# Patient Record
Sex: Female | Born: 1960 | Race: White | Hispanic: No | Smoking: Former smoker
Health system: Southern US, Community
[De-identification: ages and names within clinical notes are randomized; demographics above are authoritative.]

## PROBLEM LIST (undated history)

## (undated) DIAGNOSIS — M961 Postlaminectomy syndrome, not elsewhere classified: Secondary | ICD-10-CM

## (undated) DIAGNOSIS — M810 Age-related osteoporosis without current pathological fracture: Secondary | ICD-10-CM

## (undated) DIAGNOSIS — Z860100 Personal history of colon polyps, unspecified: Secondary | ICD-10-CM

## (undated) DIAGNOSIS — D649 Anemia, unspecified: Secondary | ICD-10-CM

## (undated) DIAGNOSIS — G43909 Migraine, unspecified, not intractable, without status migrainosus: Secondary | ICD-10-CM

## (undated) DIAGNOSIS — B019 Varicella without complication: Secondary | ICD-10-CM

## (undated) DIAGNOSIS — G4761 Periodic limb movement disorder: Secondary | ICD-10-CM

## (undated) DIAGNOSIS — K219 Gastro-esophageal reflux disease without esophagitis: Secondary | ICD-10-CM

## (undated) DIAGNOSIS — F17200 Nicotine dependence, unspecified, uncomplicated: Secondary | ICD-10-CM

## (undated) DIAGNOSIS — E559 Vitamin D deficiency, unspecified: Secondary | ICD-10-CM

## (undated) DIAGNOSIS — T7840XA Allergy, unspecified, initial encounter: Secondary | ICD-10-CM

## (undated) DIAGNOSIS — M1990 Unspecified osteoarthritis, unspecified site: Secondary | ICD-10-CM

## (undated) DIAGNOSIS — J309 Allergic rhinitis, unspecified: Secondary | ICD-10-CM

## (undated) DIAGNOSIS — R7309 Other abnormal glucose: Secondary | ICD-10-CM

## (undated) DIAGNOSIS — K2270 Barrett's esophagus without dysplasia: Secondary | ICD-10-CM

## (undated) DIAGNOSIS — R9431 Abnormal electrocardiogram [ECG] [EKG]: Secondary | ICD-10-CM

## (undated) DIAGNOSIS — M797 Fibromyalgia: Secondary | ICD-10-CM

## (undated) DIAGNOSIS — G4700 Insomnia, unspecified: Secondary | ICD-10-CM

## (undated) DIAGNOSIS — F329 Major depressive disorder, single episode, unspecified: Secondary | ICD-10-CM

## (undated) DIAGNOSIS — M5412 Radiculopathy, cervical region: Secondary | ICD-10-CM

## (undated) DIAGNOSIS — I10 Essential (primary) hypertension: Secondary | ICD-10-CM

## (undated) DIAGNOSIS — K449 Diaphragmatic hernia without obstruction or gangrene: Secondary | ICD-10-CM

## (undated) DIAGNOSIS — F32A Depression, unspecified: Secondary | ICD-10-CM

## (undated) HISTORY — DX: Fibromyalgia: M79.7

## (undated) HISTORY — DX: Age-related osteoporosis without current pathological fracture: M81.0

## (undated) HISTORY — DX: Anemia, unspecified: D64.9

## (undated) HISTORY — DX: Migraine, unspecified, not intractable, without status migrainosus: G43.909

## (undated) HISTORY — DX: Personal history of colon polyps, unspecified: Z86.0100

## (undated) HISTORY — DX: Nicotine dependence, unspecified, uncomplicated: F17.200

## (undated) HISTORY — DX: Allergy, unspecified, initial encounter: T78.40XA

## (undated) HISTORY — DX: Abnormal electrocardiogram (ECG) (EKG): R94.31

## (undated) HISTORY — DX: Gastro-esophageal reflux disease without esophagitis: K21.9

## (undated) HISTORY — DX: Allergic rhinitis, unspecified: J30.9

## (undated) HISTORY — DX: Diaphragmatic hernia without obstruction or gangrene: K44.9

## (undated) HISTORY — PX: DILATION AND CURETTAGE OF UTERUS: SHX78

## (undated) HISTORY — DX: Insomnia, unspecified: G47.00

## (undated) HISTORY — PX: BREAST SURGERY: SHX581

## (undated) HISTORY — PX: CHOLECYSTECTOMY: SHX55

## (undated) HISTORY — DX: Periodic limb movement disorder: G47.61

## (undated) HISTORY — DX: Barrett's esophagus without dysplasia: K22.70

## (undated) HISTORY — PX: DIAGNOSTIC LAPAROSCOPY: SUR761

## (undated) HISTORY — DX: Vitamin D deficiency, unspecified: E55.9

## (undated) HISTORY — DX: Major depressive disorder, single episode, unspecified: F32.9

## (undated) HISTORY — PX: TUBAL LIGATION: SHX77

## (undated) HISTORY — DX: Essential (primary) hypertension: I10

## (undated) HISTORY — PX: BACK SURGERY: SHX140

## (undated) HISTORY — DX: Other abnormal glucose: R73.09

## (undated) HISTORY — DX: Unspecified osteoarthritis, unspecified site: M19.90

## (undated) HISTORY — DX: Personal history of colonic polyps: Z86.010

## (undated) HISTORY — PX: ABDOMINAL HYSTERECTOMY: SHX81

## (undated) HISTORY — PX: OTHER SURGICAL HISTORY: SHX169

## (undated) HISTORY — DX: Varicella without complication: B01.9

## (undated) HISTORY — PX: KNEE SURGERY: SHX244

## (undated) HISTORY — PX: PARATHYROIDECTOMY: SHX19

## (undated) HISTORY — PX: THYROIDECTOMY: SHX17

## (undated) HISTORY — DX: Depression, unspecified: F32.A

---

## 1990-07-17 HISTORY — PX: OTHER SURGICAL HISTORY: SHX169

## 1995-07-18 HISTORY — PX: OTHER SURGICAL HISTORY: SHX169

## 2000-07-17 HISTORY — PX: OTHER SURGICAL HISTORY: SHX169

## 2001-05-18 ENCOUNTER — Emergency Department (HOSPITAL_COMMUNITY): Admission: EM | Admit: 2001-05-18 | Discharge: 2001-05-18 | Payer: Self-pay | Admitting: Emergency Medicine

## 2001-05-18 ENCOUNTER — Encounter: Payer: Self-pay | Admitting: Emergency Medicine

## 2001-09-14 ENCOUNTER — Emergency Department (HOSPITAL_COMMUNITY): Admission: EM | Admit: 2001-09-14 | Discharge: 2001-09-14 | Payer: Self-pay | Admitting: Emergency Medicine

## 2001-10-08 ENCOUNTER — Ambulatory Visit (HOSPITAL_COMMUNITY): Admission: RE | Admit: 2001-10-08 | Discharge: 2001-10-08 | Payer: Self-pay | Admitting: Orthopaedic Surgery

## 2001-10-08 ENCOUNTER — Encounter: Payer: Self-pay | Admitting: Orthopaedic Surgery

## 2003-07-18 HISTORY — PX: BREAST EXCISIONAL BIOPSY: SUR124

## 2004-03-31 ENCOUNTER — Ambulatory Visit (HOSPITAL_BASED_OUTPATIENT_CLINIC_OR_DEPARTMENT_OTHER): Admission: RE | Admit: 2004-03-31 | Discharge: 2004-03-31 | Payer: Self-pay | Admitting: Surgery

## 2004-03-31 ENCOUNTER — Ambulatory Visit (HOSPITAL_COMMUNITY): Admission: RE | Admit: 2004-03-31 | Discharge: 2004-03-31 | Payer: Self-pay | Admitting: Surgery

## 2004-03-31 ENCOUNTER — Encounter (INDEPENDENT_AMBULATORY_CARE_PROVIDER_SITE_OTHER): Payer: Self-pay | Admitting: *Deleted

## 2004-10-06 ENCOUNTER — Ambulatory Visit: Payer: Self-pay | Admitting: Family Medicine

## 2005-06-23 ENCOUNTER — Ambulatory Visit: Payer: Self-pay

## 2005-07-17 HISTORY — PX: OTHER SURGICAL HISTORY: SHX169

## 2005-12-08 ENCOUNTER — Ambulatory Visit: Payer: Self-pay | Admitting: Family Medicine

## 2005-12-19 ENCOUNTER — Ambulatory Visit: Payer: Self-pay | Admitting: Family Medicine

## 2006-02-10 ENCOUNTER — Emergency Department (HOSPITAL_COMMUNITY): Admission: EM | Admit: 2006-02-10 | Discharge: 2006-02-11 | Payer: Self-pay | Admitting: Emergency Medicine

## 2007-01-01 ENCOUNTER — Ambulatory Visit: Payer: Self-pay | Admitting: Gastroenterology

## 2007-07-18 HISTORY — PX: VAGINAL HYSTERECTOMY: SHX2639

## 2007-07-18 HISTORY — PX: OTHER SURGICAL HISTORY: SHX169

## 2007-07-22 ENCOUNTER — Encounter (INDEPENDENT_AMBULATORY_CARE_PROVIDER_SITE_OTHER): Payer: Self-pay | Admitting: Obstetrics and Gynecology

## 2007-07-22 ENCOUNTER — Ambulatory Visit (HOSPITAL_COMMUNITY): Admission: RE | Admit: 2007-07-22 | Discharge: 2007-07-23 | Payer: Self-pay | Admitting: Obstetrics and Gynecology

## 2008-01-08 ENCOUNTER — Ambulatory Visit: Payer: Self-pay | Admitting: Internal Medicine

## 2008-01-10 ENCOUNTER — Ambulatory Visit: Payer: Self-pay | Admitting: Internal Medicine

## 2008-01-24 ENCOUNTER — Emergency Department: Payer: Self-pay | Admitting: Emergency Medicine

## 2008-02-04 ENCOUNTER — Ambulatory Visit: Payer: Self-pay | Admitting: Gastroenterology

## 2008-02-07 ENCOUNTER — Ambulatory Visit: Payer: Self-pay | Admitting: Gastroenterology

## 2008-02-25 ENCOUNTER — Ambulatory Visit: Payer: Self-pay | Admitting: Obstetrics and Gynecology

## 2008-02-25 ENCOUNTER — Other Ambulatory Visit: Payer: Self-pay

## 2008-03-06 ENCOUNTER — Ambulatory Visit: Payer: Self-pay | Admitting: Obstetrics and Gynecology

## 2008-05-15 ENCOUNTER — Ambulatory Visit: Payer: Self-pay | Admitting: Internal Medicine

## 2008-07-17 HISTORY — PX: SHOULDER ARTHROSCOPY: SHX128

## 2008-11-12 ENCOUNTER — Ambulatory Visit: Payer: Self-pay | Admitting: Internal Medicine

## 2008-11-17 ENCOUNTER — Ambulatory Visit: Payer: Self-pay | Admitting: Gastroenterology

## 2009-01-19 ENCOUNTER — Ambulatory Visit: Payer: Self-pay | Admitting: Gastroenterology

## 2009-02-14 LAB — HM COLONOSCOPY

## 2009-04-08 ENCOUNTER — Encounter: Payer: Self-pay | Admitting: Family Medicine

## 2009-04-16 ENCOUNTER — Encounter: Payer: Self-pay | Admitting: Family Medicine

## 2009-04-28 ENCOUNTER — Ambulatory Visit: Payer: Self-pay | Admitting: Family Medicine

## 2009-05-05 ENCOUNTER — Ambulatory Visit: Payer: Self-pay | Admitting: Family Medicine

## 2009-05-17 ENCOUNTER — Encounter: Payer: Self-pay | Admitting: Family Medicine

## 2009-06-24 HISTORY — PX: OTHER SURGICAL HISTORY: SHX169

## 2009-07-15 LAB — HM PAP SMEAR

## 2009-10-26 ENCOUNTER — Ambulatory Visit: Payer: Self-pay | Admitting: Family Medicine

## 2009-11-03 ENCOUNTER — Ambulatory Visit: Payer: Self-pay | Admitting: Family Medicine

## 2010-08-01 ENCOUNTER — Encounter: Payer: Self-pay | Admitting: Cardiology

## 2010-09-19 ENCOUNTER — Ambulatory Visit: Payer: Self-pay | Admitting: Family Medicine

## 2010-09-19 ENCOUNTER — Encounter: Payer: Self-pay | Admitting: Cardiology

## 2010-09-22 ENCOUNTER — Encounter: Payer: Self-pay | Admitting: Cardiology

## 2010-09-22 ENCOUNTER — Other Ambulatory Visit (INDEPENDENT_AMBULATORY_CARE_PROVIDER_SITE_OTHER): Payer: 59

## 2010-09-22 ENCOUNTER — Other Ambulatory Visit: Payer: Self-pay | Admitting: Cardiology

## 2010-09-22 ENCOUNTER — Ambulatory Visit (INDEPENDENT_AMBULATORY_CARE_PROVIDER_SITE_OTHER): Payer: 59 | Admitting: Cardiology

## 2010-09-22 DIAGNOSIS — R9431 Abnormal electrocardiogram [ECG] [EKG]: Secondary | ICD-10-CM | POA: Insufficient documentation

## 2010-09-27 NOTE — Assessment & Plan Note (Signed)
Summary: EKG changes/Dr.Kristi Smith/sab   Visit Type:  Initial Consult Primary Provider:  Reginia Forts, M.D.  CC:  Cardiac clearance for neck surgery; scheduled for March 13 and 2012 at [facility] with Dr. Myrtis Ser.  Was told by PCP EKG looks abnormal. She also c/o rapid heart beats at times..  History of Present Illness: 50 yo with history of HTN, GERD, depression, and neck arthritis presents for cardiology evaluation prior to c-spine surgery under general anesthesia later this month at [facility].  Patient was seen by her PCP recently for pre-surgical visit and was found to have some changes on her ECG: there was slight scooped ST depression in leads V3-V6.  She was referred here for further evaluation.  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  She does not get formal exercise but denies any significant exertional dyspnea walking on flat ground.  She is mildly winded after climbing a long flight of steps.  She is somewhat limited by pain in her left knee.  She has never had chest pain or pressure.  She quit smoking in 2011.  She reports that her cholesterol has been good when checked by Dr. Tamala Julian.  BP is under control in the office today.   ECG: NSR, slight (< 0.5 mm) scooped ST depression in leads V3-V6  Labs (1/12): HCT 39.5, K 3.9, creatinine 0.67  Current Medications (verified): 1)  Singulair 10 Mg Tabs (Montelukast Sodium) .Marland Kitchen.. 1 Tablet Once Daily For Allergies 2)  Tizanidine Hcl 4 Mg Tabs (Tizanidine Hcl) .Marland Kitchen.. 1 Tablet At Bedtime, As Needed 3)  Hyoscyamine Sulfate 0.125 Mg Tabs (Hyoscyamine Sulfate) .Marland Kitchen.. 1-2 Tablet Every 4-6 Hours As Needed Abdominal Pain 4)  Amlodipine Besylate 5 Mg Tabs (Amlodipine Besylate) .Marland Kitchen.. 1 Tablet Once Daily 5)  Hydrochlorothiazide 12.5 Mg Tabs (Hydrochlorothiazide) .... Take One Tablet By Mouth Daily. 6)  Propranolol Hcl 60 Mg Tabs (Propranolol Hcl)  .Marland Kitchen.. 1 Tablet Two Times A Day 7)  Hydrocodone-Acetaminophen 5-325 Mg Tabs (Hydrocodone-Acetaminophen) .Marland Kitchen.. 1-2 Tablets Every 6 Hours As Needed 8)  Zyrtec Allergy 10 Mg Tabs (Cetirizine Hcl) .Marland Kitchen.. 1 Tablet Once Daily 9)  Dexilant 60 Mg Cpdr (Dexlansoprazole) .Marland Kitchen.. 1 Tablet Once Daily 10)  Dilaudid 4 Mg Tabs (Hydromorphone Hcl) .Marland Kitchen.. 1 Tablet Every 4-6 Hours As Needed  Allergies (verified): 1)  ! Wellbutrin 2)  ! * Ritalin 3)  ! Celebrex 4)  ! * Provigil 5)  ! * Aciphex  Past History:  Past Surgical History: Last updated: 09/21/2010 C-section x2 Pilonidal cyst-resection-1992 Cholecystectomy Carpal tunnel syndrome-release-bilateral  D&C-SAB Tubel Ligation-2002 Breast bx-right-2005 Fusion of cervical spine-2007 Hx laparoscopy-right oophorectomy performed Vaginal hyserectomy-2009 Right shoulder surgery-06/24/2009  Family History: Last updated: 09/22/2010 Father: "angina" in his late 59s, PCI in his late 60s Mother: bipolar disorder,renal artery stenosis, bladder cancer, HTN OA,Diabetes Grandfather: CVA  Social History: Last updated: 09/22/2010 Tobacco Use - 30-35 years 1 ppd-quit 2011 Alcohol Use - yes Regular Exercise - no Drug Use - no Works at [employer], has a Network engineer job Married, lives in [place] Factors: Exercise: no (09/21/2010)  Risk Factors: Smoking Status: quit (09/21/2010)  Past Medical History: 1. 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 GERD 4. Hypertension 5. Cholcystectomy 6. 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   Family History: Father: "angina" in his late 56s, PCI in his late 59s Mother: bipolar disorder,renal artery stenosis,  bladder cancer, HTN OA,Diabetes Grandfather: CVA  Social History: Tobacco Use - 30-35 years 1 ppd-quit 2011 Alcohol Use - yes Regular Exercise - no Drug Use - no Works at [employer], has a Network engineer job Married, lives in [place] reviewed and negative except as per HPI.   Vital Signs:  Patient profile:   50 year old female Height:      62 inches Weight:      170 pounds BMI:     31.21 Pulse rate:   56 / minute BP sitting:   130 / 84  (left arm) Cuff size:   regular  Vitals Entered By: Dolores Lory, CMA (September 22, 2010 11:03 AM)  Physical Exam  General:  Well developed, well nourished, in no acute distress. Overweight.  Head:  normocephalic and atraumatic Nose:  no deformity, discharge, inflammation, or lesions Mouth:  Teeth, gums and palate normal. Oral mucosa normal. Neck:  Neck supple, no JVD. No masses, thyromegaly or abnormal cervical nodes. Lungs:  Clear bilaterally to auscultation and percussion. Heart:  Non-displaced PMI, chest non-tender; regular rate and rhythm, S1, S2 without murmurs, rubs or gallops. Carotid upstroke normal, no bruit.  Pedals normal pulses. No edema, no varicosities. Abdomen:  Bowel sounds positive; abdomen soft and non-tender without masses, organomegaly, or hernias noted. No hepatosplenomegaly. Extremities:  No clubbing or cyanosis. Neurologic:  Alert and oriented x 3. Skin:  Intact without lesions or rashes. Psych:  Normal affect.   Impression & Recommendations:  Problem # 1:  ELECTROCARDIOGRAM, ABNORMAL (ICD-794.31) Patient has a nonspecifically abnormal ECG.  She has no significant exertional symptoms and certainly is able to exert herself to over 4 METS with no cardiopulmonary limitation.  I will get an echocardiogram given the mildly abnormal ECG, but I do not think that a pre-operative stress test would be indicated.  She should be relatively low risk for general anesthesia.  We will get the echo today and as long as there are no significant problems on this, she needs no further workup prior to surgery.  Would continue her antihypertensives peri-operatively.   Other Orders: Echocardiogram (Echo)  Patient  Instructions: 1)  Your physician recommends that you follow up as needed. 2)  Your physician recommends that you continue on your current medications as directed. Please refer to the Current Medication list given to you today. 3)  TO BE DONE TODAY:  Your physician has requested that you have an echocardiogram.  Echocardiography is a painless test that uses sound waves to create images of your heart. It provides your doctor with information about the size and shape of your heart and how well your heart's chambers and valves are working.  This procedure takes approximately one hour. There are no restrictions for this procedure.

## 2010-11-29 NOTE — Discharge Summary (Signed)
NAMENANCYLEE, Emily Melton NO.:  [id number]   MEDICAL RECORD NO.:  [id number]          PATIENT TYPE:  OIB   LOCATION:  9303                          FACILITY:  [facility]   PHYSICIAN:  Darlyn Chamber, M.D.   DATE OF BIRTH:  23-Aug-1960   DATE OF ADMISSION:  07/22/2007  DATE OF DISCHARGE:  07/23/2007                               DISCHARGE SUMMARY   ADMITTING DIAGNOSES:  1. Menorrhagia and dysmenorrhea probably secondary to uterine      adenomyosis.  2. Endometrial polyp.  3. Hypertension.   DISCHARGE DIAGNOSES:  1. Menorrhagia and dysmenorrhea probably secondary to uterine      adenomyosis.  2. Endometrial polyp.  3. Hypertension.   OPERATIVE PROCEDURE:  Laparoscopic-assisted vaginal hysterectomy.   For complete history and physical, please see dictated note.   COURSE IN THE HOSPITAL:  The patient underwent above-noted surgery.  Pathology is still pending.  Postop did well.  Postop hemoglobin 9.9.  Discharged home on first postop day.  At that time, afebrile, stable  vital signs.  Abdomen was soft, nontender, and bowel sounds were active.  Both incisions were clear.  She was having no significant vaginal  bleeding.  Was voiding without difficulty.  Was tolerating her diet and  ambulating without difficulty.   In terms of complications, none were encountered during her stay in the  hospital.  The patient was discharged home in stable condition.   DISPOSITION:  The patient was discharged home on Percocet as needed for  pain.  She is to avoid heavy lifting, vaginal entrance, or driving of a  car.  She is to watch for signs of infection, nausea, vomiting,  increasing abdominal or pelvic pain, or active vaginal bleeding.  Signs  and symptoms of deep venous thrombosis and pulmonary embolus were also  discussed.  Followup in the office will be in 1 week.      Darlyn Chamber, M.D.  Electronically Signed     JSM/MEDQ  D:  07/23/2007  T:  07/23/2007  Job:  [id number]

## 2010-11-29 NOTE — H&P (Signed)
NAME:  Emily Melton, Emily Melton NO.:  [id number]   MEDICAL RECORD NO.:  [id number]          PATIENT TYPE:  AMB   LOCATION:  SDC                           FACILITY:  [facility]   PHYSICIAN:  Darlyn Chamber, M.D.   DATE OF BIRTH:  29-Apr-1961   DATE OF ADMISSION:  07/22/2007  DATE OF DISCHARGE:                              HISTORY & PHYSICAL   The patient to is a 50 year old gravida 3, para 2, abortus one female,  who presents for a laparoscopic-assisted vaginal hysterectomy.   In relation to the present admission, the patient's cycles are regular.  She has 5-7 days of flow, 2-3 days being heavy.  During this time she  has significant pain and discomfort.  She is also having pain with bowel  movements.  Subsequent saline infusion ultrasound did reveal an  endometrial polyp.  Otherwise findings were unremarkable.  We discussed  alternatives including hysteroscopic resection of the polyp with  subsequent ablation versus use of an agent such as birth control pills  or IUD versus hysterectomy, for which the patient is admitted at the  present time.  She does understand other alternatives.   ALLERGIES:  She is allergic to [facility] WORT, RITALIN, PROVIGIL,  WELLBUTRIN, NEURONTIN, REMERON, CELEBREX, LISINOPRIL.   MEDICATIONS AT THE PRESENT TIME:  1. Toprol XR 50 mg.  2. Methyldopa.  3. Zyrtec.  4. Singulair.  5. Zegerid and the dose is 40/100 mg.   PAST MEDICAL HISTORY:  1. She does have a history of Barrett's esophagitis, for which she is      on medications.  2. Also a history of hypertension, for which she is on medication.   PAST SURGICAL HISTORY:  She has had two cesarean sections.  She has had  a laparoscopic cholecystectomy.  She has had part of her tail bone  removed.  She has had previous hand surgery.  She has had fusion of the  cervical disk in the neck.   OBSTETRICAL HISTORY:  She has had two cesarean section.  She has had a  previous bilateral tubal ligation with  the last cesarean section.   FAMILY HISTORY:  There is a history of hypertension, reflux esophagitis,  and a history of anemia.   SOCIAL HISTORY:  The patient does smoke less than a pack per day.  Occasional alcohol use.   REVIEW OF SYSTEMS:  Noncontributory.   PHYSICAL EXAM:  The patient is afebrile, stable vital signs.  HEENT: The patient is normocephalic.  Pupils equal, round and reactive  to light and accommodation.  Extraocular movements were intact.  Sclerae  and conjunctivae were clear.  Oropharynx clear.  NECK:  Without thyromegaly.  BREASTS:  No discrete masses.  LUNGS:  Clear.  CARDIAC:  Regular rhythm and rate without murmurs or gallops.  ABDOMEN:  Benign.  No mass, organomegaly or tenderness.  PELVIC:  Normal external genitalia.  Vaginal mucosa is clear.  Cervix  unremarkable.  Uterus upper limits of normal size.  Adnexa unremarkable.  EXTREMITIES:  Trace edema.  NEUROLOGIC:  Grossly within normal limits.   IMPRESSION:  1. Menorrhagia/dysmenorrhea.  2. Endometrial polyp.  3. Hypertension.   PLAN:  After discussion of options, we will proceed with a laparoscopic-  assisted vaginal hysterectomy.  The risks of surgery have been discussed  including the risk of infection; the risk of hemorrhage that could  require transfusion with the risk of AIDS or hepatitis; the risk of  injury to adjacent organs including bladder, bowel or ureters that could  require further exploratory surgery; the risk of deep venous thrombosis  and pulmonary emboli.  Ovaries will be left in the place per patient's  request.  Potential risks of malignant transformation have been  discussed.      Darlyn Chamber, M.D.  Electronically Signed     JSM/MEDQ  D:  07/22/2007  T:  07/22/2007  Job:  [id number]

## 2010-11-29 NOTE — Op Note (Signed)
Emily Melton, Emily Melton NO.:  [id number]   MEDICAL RECORD NO.:  [id number]          PATIENT TYPE:  AMB   LOCATION:  SDC                           FACILITY:  [facility]   PHYSICIAN:  Darlyn Chamber, M.D.   DATE OF BIRTH:  15-Feb-1961   DATE OF PROCEDURE:  07/22/2007  DATE OF DISCHARGE:                               OPERATIVE REPORT   PREOPERATIVE DIAGNOSIS:  Menorrhagia and dysmenorrhea thought to be  secondary to adenomyosis and endometrial polyp.   POSTOPERATIVE DIAGNOSIS:  Menorrhagia and dysmenorrhea thought to be  secondary to adenomyosis and endometrial polyp.   PROCEDURE:  Laparoscopic assisted vaginal hysterectomy.   SURGEON:  Darlyn Chamber, M.D.   ASSISTANT:  Matthew Saras.   ANESTHESIA:  General endotracheal.   ESTIMATED BLOOD LOSS:  300 to 400 mL.   PACKS AND DRAINS:  None.   INTRAOPERATIVE BLOOD REPLACEMENT:  None.   COMPLICATIONS:  None.   INDICATIONS:  Are as dictated in the history and physical.   PROCEDURE IN DETAIL:  The patient was taken to the OR and placed in the  supine position.  After satisfactory level general of endotracheal  anesthesia was obtained, the patient was placed in the dorsal lithotomy  position using the Allen stirrups.  At this point in time the abdomen,  perineum and vagina were prepped out with Betadine.  The bladder was  emptied by in-and-out catheterization.  A Hulka tenaculum was put in  place and secured.  Patient draped in sterile field.  A subumbilical  incision was made with a knife.  The incision was extended through  subcutaneous tissue.  The fascia was identified and entered sharply.  Incision fashioned laterally.  Peritoneum was already entered at this  point.  There was no evidence of injury to adjacent organs.  An open  laparoscopic trocar was put in place and secured.  The abdomen was  insufflated with carbon dioxide.  Laparoscope was introduced.  There was  no evidence of injury to adjacent organs.  Appendix  was retrocecal and  unremarkable.  The gallbladder was surgically absent.  Both lateral  gutters were clear.  Uterus was upper limits of normal size.  Tubes and  ovaries were unremarkable.  Previous bilateral tubal ligation was noted.  Cul-de-sac was clear.  Next a 5 mm trocar was put in place in suprapubic  area under direct visualization.  The gyrus bipolar was brought into  place.  First the right utero-ovarian pedicle was cauterized incised and  the right round ligament was cauterized incised.  Next, the left utero-  ovarian pedicle was cauterized incised and the left round ligament was  cauterized incised.  We had good freeing of the adnexa.  Decision was to  go vaginally.   The abdomen was deflated of carbon dioxide.  Laparoscope was removed.  The patient's legs were repositioned.  The Hulka tenaculum was then  removed.  Weighted speculum was placed in the vaginal vault.  The cervix  grasped with a Jacobs tenaculum.  Attempts to enter the cul-de-sac  initially were unsuccessful.  Therefore we excised the  reflection of the  vaginal mucosa around the cervix and pushed the bladder superiorly.  The  uterosacral ligaments were then clamped, cut and suture ligated with 0  Vicryl.  Cul-de-sac was then entered sharply.  Paracervical tissue was  clamped, cut and suture ligated with 0 Vicryl.  Bladder flap was  identified.  The vesicouterine space was entered and a retractor was put  in place to retract the bladder superiorly.  Using the clamp, cut and  tie technique with suture ligature of 0 Vicryl the parametrium was  serially separated from the sides of the uterus.  Uterus was then  flipped, remaining pedicles were clamped and cut.  Uterus and cervix  were passed off the operative field and sent to pathology.  Held  pedicles secured with free ties of 0 Vicryl.  Uterosacral plication  stitch of 0 Vicryl was put in place and secured.  The vaginal mucosa was  closed with interrupted  figure-of-eights of 0 Monocryl.  Foley was  placed to straight drain and clear urine was identified.   The patient's legs were repositioned.  The abdomen was reinflated with  carbon dioxide.  Laparoscope was reintroduced.  We irrigated the pelvis.  Areas of bleeding at the vaginal cuff were brought under control using  the gyrus.  Both ovaries were hemostatically intact and at this point  the cuff was hemostatically intact.  We thoroughly irrigated the pelvis,  removed the irrigation, deflated the abdomen and again no bleeding was  encountered.  At this point in time the abdomen deflated of its carbon  dioxide.  All trocars removed.  Subumbilical fascia closed with figure-  of-eight of 0 Vicryl, skin with interrupted subcuticular of 4-0 Vicryl.  Suprapubic incision was closed Dermabond.  At this point in time the  patient was taken out of the dorsal lithotomy position.  Once alert and  extubated transferred to recovery room in good condition.  Sponge,  instrument and needle count reported as correct by circulating nurse  multiple times.  Foley catheter remained clear at time of closure.      Darlyn Chamber, M.D.  Electronically Signed     JSM/MEDQ  D:  07/22/2007  T:  07/22/2007  Job:  [id number]

## 2010-12-02 NOTE — Op Note (Signed)
NAME:  Emily Melton, Emily Melton                           ACCOUNT NO.:  [id number]   MEDICAL RECORD NO.:  [id number]                   PATIENT TYPE:  AMB   LOCATION:  DSC                                  FACILITY:  [facility]   PHYSICIAN:  Haywood Lasso, M.D.           DATE OF BIRTH:  [birth year]   DATE OF PROCEDURE:  03/31/2004  DATE OF DISCHARGE:                                 OPERATIVE REPORT   DIAGNOSES:  Right breast mass, probable fibroadenoma.   POSTOPERATIVE DIAGNOSIS:  Right breast mass, probable fibroadenoma.   OPERATION:  Excision, right breast mass.   SURGEON:  Dr. Margot Chimes.   ANESTHESIA:  Monitored anesthesia care.   CLINICAL HISTORY:  This patient is a 50 year old with a slightly enlarging  right breast mass in the medial aspect. A cor biopsy had been done showing a  fibroadenoma, but since that was done, the mass has enlarged, and excision  was recommended. The patient decided to proceed in this direction.   The patient was seen in the holding area and had no further questions. The  right breast was marked as the operative site by the patient and myself. She  was taken to the operating room, and prior to be given any IV sedation, the  mass itself was identified and marked with the patient on the table with her  arm extended.   The patient was then given IV sedation. The breast was prepped and draped.  Xylocaine 1% plain was infiltrated over the mass. I also infiltrated some  around it. A transverse incision was made. Little of the subcutaneous  divided, and I could see the very top of the mass which appeared to be a  fibroadenoma. A 3-0 suture was placed for traction. Using cautery, it was  excised intact with a little rim of fatty and breast tissue around it. This  was sent to pathology.   The wound was checked for hemostasis, and once everything dry, it was closed  with some 3-0 Vicryl followed by 4-0 Monocryl subcuticular and Indermil skin  closure.   The patient  tolerated the procedure well. There were no operative  complications, and all counts were correct.                                               Haywood Lasso, M.D.    CJS/MEDQ  D:  03/31/2004  T:  03/31/2004  Job:  [id number]   cc:   Juluis Pitch  [place]  [place]  [place] [address]  Fax: [phone]

## 2011-02-01 ENCOUNTER — Encounter: Payer: Self-pay | Admitting: Cardiology

## 2011-04-06 LAB — CBC
HCT: 28.1 — ABNORMAL LOW
Hemoglobin: 9.9 — ABNORMAL LOW
MCHC: 35.4
MCV: 91.4
Platelets: 133 — ABNORMAL LOW
RBC: 3.07 — ABNORMAL LOW
RDW: 12.9
WBC: 8.1

## 2011-04-19 ENCOUNTER — Ambulatory Visit: Payer: Self-pay | Admitting: Family Medicine

## 2011-04-20 LAB — HM MAMMOGRAPHY

## 2011-04-21 LAB — CBC
HCT: 32.2 — ABNORMAL LOW
Hemoglobin: 11.6 — ABNORMAL LOW
MCHC: 36
MCV: 89.4
Platelets: 173
RBC: 3.6 — ABNORMAL LOW
RDW: 13.1
WBC: 5.4

## 2011-04-21 LAB — HCG, SERUM, QUALITATIVE: Preg, Serum: NEGATIVE

## 2011-08-21 LAB — LIPID PANEL
Cholesterol: 218 mg/dL — AB (ref 0–200)
HDL: 49 mg/dL (ref 35–70)
LDL Cholesterol: 143 mg/dL
LDl/HDL Ratio: 2.9
Triglycerides: 128 mg/dL (ref 40–160)

## 2011-08-21 LAB — CBC AND DIFFERENTIAL
HCT: 39 % (ref 36–46)
Hemoglobin: 13.1 g/dL (ref 12.0–16.0)
Platelets: 179 10*3/uL (ref 150–399)

## 2011-08-21 LAB — HEMOGLOBIN A1C: Hgb A1c MFr Bld: 5.8 % (ref 4.0–6.0)

## 2011-09-15 HISTORY — PX: SPINE SURGERY: SHX786

## 2011-12-22 ENCOUNTER — Ambulatory Visit: Payer: Self-pay | Admitting: Gastroenterology

## 2011-12-25 LAB — PATHOLOGY REPORT

## 2012-02-21 ENCOUNTER — Encounter: Payer: Self-pay | Admitting: *Deleted

## 2012-02-24 ENCOUNTER — Encounter: Payer: Self-pay | Admitting: Family Medicine

## 2012-02-26 ENCOUNTER — Encounter: Payer: Self-pay | Admitting: Family Medicine

## 2012-02-26 ENCOUNTER — Ambulatory Visit: Payer: 59

## 2012-02-26 ENCOUNTER — Ambulatory Visit (INDEPENDENT_AMBULATORY_CARE_PROVIDER_SITE_OTHER): Payer: 59 | Admitting: Family Medicine

## 2012-02-26 VITALS — BP 142/84 | HR 66 | Temp 97.2°F | Resp 18 | Ht 62.5 in | Wt 187.0 lb

## 2012-02-26 DIAGNOSIS — M25571 Pain in right ankle and joints of right foot: Secondary | ICD-10-CM

## 2012-02-26 DIAGNOSIS — M25579 Pain in unspecified ankle and joints of unspecified foot: Secondary | ICD-10-CM

## 2012-02-26 DIAGNOSIS — I10 Essential (primary) hypertension: Secondary | ICD-10-CM

## 2012-02-26 DIAGNOSIS — E7800 Pure hypercholesterolemia, unspecified: Secondary | ICD-10-CM

## 2012-02-26 DIAGNOSIS — K635 Polyp of colon: Secondary | ICD-10-CM

## 2012-02-26 LAB — CBC WITH DIFFERENTIAL/PLATELET
Basophils Absolute: 0 10*3/uL (ref 0.0–0.1)
Basophils Relative: 1 % (ref 0–1)
Eosinophils Absolute: 0.2 10*3/uL (ref 0.0–0.7)
Eosinophils Relative: 4 % (ref 0–5)
HCT: 37.6 % (ref 36.0–46.0)
Hemoglobin: 12.7 g/dL (ref 12.0–15.0)
Lymphocytes Relative: 34 % (ref 12–46)
Lymphs Abs: 2 10*3/uL (ref 0.7–4.0)
MCH: 30.8 pg (ref 26.0–34.0)
MCHC: 33.8 g/dL (ref 30.0–36.0)
MCV: 91 fL (ref 78.0–100.0)
Monocytes Absolute: 0.4 10*3/uL (ref 0.1–1.0)
Monocytes Relative: 7 % (ref 3–12)
Neutro Abs: 3.2 10*3/uL (ref 1.7–7.7)
Neutrophils Relative %: 54 % (ref 43–77)
Platelets: 179 10*3/uL (ref 150–400)
RBC: 4.13 MIL/uL (ref 3.87–5.11)
RDW: 13.8 % (ref 11.5–15.5)
WBC: 5.9 10*3/uL (ref 4.0–10.5)

## 2012-02-26 LAB — COMPREHENSIVE METABOLIC PANEL
ALT: 16 U/L (ref 0–35)
AST: 20 U/L (ref 0–37)
Albumin: 4 g/dL (ref 3.5–5.2)
Alkaline Phosphatase: 55 U/L (ref 39–117)
BUN: 11 mg/dL (ref 6–23)
CO2: 25 mEq/L (ref 19–32)
Calcium: 9.2 mg/dL (ref 8.4–10.5)
Chloride: 107 mEq/L (ref 96–112)
Creat: 0.63 mg/dL (ref 0.50–1.10)
Glucose, Bld: 95 mg/dL (ref 70–99)
Potassium: 3.9 mEq/L (ref 3.5–5.3)
Sodium: 141 mEq/L (ref 135–145)
Total Bilirubin: 0.4 mg/dL (ref 0.3–1.2)
Total Protein: 6.3 g/dL (ref 6.0–8.3)

## 2012-02-26 NOTE — Progress Notes (Signed)
Subjective:    Patient ID: Emily Melton, female    DOB: 1961/07/10, 51 y.o.   MRN: [id number]  Foot Pain Associated symptoms include abdominal pain, arthralgias, joint swelling and neck pain. Pertinent negatives include no chest pain, chills, congestion, diaphoresis, fatigue, fever, headaches, nausea, numbness, vomiting or weakness.   1.  This 51 y.o. female presents for evaluation of R ankle pain.  Injured and twisted R ankle/foot two weeks ago; suffered with lateral R ankle pain and R lateral foot pain. +swelling along lateral ankle; +pain with weight bearing.  After initial injury, reinjured R foot/ankle several days later.  No elevating of foot; no ice or heat; no NSAIDs due to side effect of elevated blood pressure.  No previous injury to foot or ankle.    2.  Hypertension: six month follow-up for blood pressure.  No changes made at last visit.  Home blood pressures running 115-145/70-90.  Denies chest pain, palpitations, shortness of breath, or diaphoresis.  Good compliance with medication; good tolerance to medication; good symptom control.    3.  Hypercholesterolemia: six month follow-up for elevated cholesterol at [facility] in 08/2011.  Non-fasting today.  Denies chest pain, palpitations, shortness of breath, focal weakness, numbness, tingling, headaches, or dizziness.    4.  DDD cervical spine:  S/p neck fusion 10/03/11; continues to suffer with R forearm numbness/tinglilng.  S/p MRI elbow; s/p NCS.  Follow-up with NS in 03/2012; working full time.  5.  Colon polyps:  S/p Colonoscopy and EGD 12/22/11.  +multiple polyps on colonoscopy; repeat in 5 years per [facility]; EGD without evidence of Barrett's.  No repeat needed; rx for hyoscyamine provided for intermittent abdominal pain.  S/p allergy testing with no food allergies present; +allergies to pollen/grass/weeds/dust.     Past Medical History  Diagnosis Date  . Depression   . Narcolepsy     w/o cataplexy  . GERD (gastroesophageal reflux  disease)   . HTN (hypertension)   . 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 Chicken pox     childhood  . Migraine, unspecified, without mention of intractable migraine without mention of status migrainosus   . Periodic limb movement disorder   . Nonspecific abnormal electrocardiogram (ECG) (EKG)   . Other and unspecified disc disorder of cervical region   . Diaphragmatic hernia without mention of obstruction or gangrene   . Tobacco use disorder   . Barrett's esophagus   . Personal history of colonic polyps   . Allergic rhinitis, cause unspecified   . Insomnia, unspecified   . Unspecified vitamin D deficiency   . Other abnormal glucose   . Pre-operative cardiovascular examination     Past Surgical History  Procedure Date  . Cesarean section     x2  1984 and 1986  . Pilonidal cyst resection 1992  . Cholecystectomy   . Carpal tunnel release - bilateral   . Dilation and curettage of uterus     SAB  . Tubel ligation 2002  . Breast biopsy 2005     R   Benign  . Fusion of cervical spine 2007    C5,C6,C7, and T1  . Laprascopy 2009    for RLQ pain:R oophorectomy performed no improvement in pain.   . Vaginal hysterectomy 2009    fibroid/endometriosis, one L ovary remaining  . R shoulder surgery 06/24/09    2 tears in rotator cuff,bone spurs. Dr. Modena Morrow  . 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 Authorizing Provider  amLODipine (NORVASC) 5 MG tablet Take 5 mg by mouth daily.     Yes Historical Provider, MD  cetirizine (ZYRTEC) 10 MG tablet Take 10 mg by mouth daily.     Yes Historical Provider, MD  Cholecalciferol (VITAMIN D3) 2000 UNITS TABS Take by mouth daily.   Yes Historical Provider, MD  dexlansoprazole (DEXILANT) 60 MG capsule Take 60 mg by mouth daily.     Yes Historical Provider, MD  hydrochlorothiazide (HYDRODIURIL) 12.5 MG tablet Take  12.5 mg by mouth daily.     Yes Historical Provider, MD  HYDROcodone-acetaminophen (NORCO) 5-325 MG per tablet Take 1-2 tablets by mouth every 6 (six) hours as needed.     Yes Historical Provider, MD  hyoscyamine (ANASPAZ) 0.125 MG TBDP Place 0.125-0.25 mg under the tongue every 6 (six) hours as needed.     Yes Historical Provider, MD  Misc Natural Products (TART CHERRY ADVANCED PO) Take 1-2 tablets by mouth daily.   Yes Historical Provider, MD  montelukast (SINGULAIR) 10 MG tablet Take 10 mg by mouth daily.     Yes Historical Provider, MD  Multiple Vitamin (MULTIVITAMIN) tablet Take 1 tablet by mouth daily.   Yes Historical Provider, MD  Omega-3 Fatty Acids (FISH OIL PO) Take 1,200 mg by mouth daily.   Yes Historical Provider, MD  propranolol (INDERAL) 60 MG tablet Take 60 mg by mouth 2 (two) times daily.     Yes Historical Provider, MD  tiZANidine (ZANAFLEX) 4 MG tablet Take 4 mg by mouth at bedtime as needed.     Yes Historical Provider, MD  HYDROmorphone (DILAUDID) 4 MG tablet Take 4 mg by mouth every 4 (four) hours as needed.      Historical Provider, MD    Allergies  Allergen Reactions  . St Johns Wort Anaphylaxis  . Bupropion Hcl   . Celebrex (Celecoxib)   . Lyrica (Pregabalin)   . Methylphenidate Hcl   . Modafinil   . Rabeprazole Sodium     History   Social History  . Marital Status: Married    Spouse Name: N/A    Number of Children: 2  . Years of Education: N/A   Occupational History  . Shipping Claims Analyst    Social History Main Topics  . Smoking status: Current Everyday Smoker  . Smokeless tobacco: Not on file   Comment: smoked 1 ppd 30-35 years; quit in 2011  . Alcohol Use: Yes  . Drug Use: No  . Sexually Active: Not on file   Other Topics Concern  . Not on file   Social History Narrative   Patient exercises 3-4 times per week (walking x 20 minutes 3 days a week). Married; lives in [place]. Works at [employer] job; does not get regular  exercise.     Family History  Problem Relation Age of Onset  . Stroke      GF   . Bipolar disorder Mother   . Cancer Mother     bladder  . Hypertension Mother   . Colon polyps Mother   . Osteoarthritis Mother   . Diabetes Mother   . Osteoporosis      kyphosis  . Heart disease Father     angina ?possible AMI in past  . Hyperlipidemia Father       Review of Systems  Constitutional: Negative.  Negative for fever, chills, diaphoresis, fatigue and unexpected weight change.  HENT: Positive for neck pain and neck stiffness. Negative for ear pain and congestion.   Eyes: Negative.   Respiratory: Negative.  Negative for shortness of breath and wheezing.   Cardiovascular: Negative.  Negative for chest pain, palpitations and leg swelling.  Gastrointestinal: Positive for abdominal pain. Negative for nausea, vomiting, diarrhea, blood in stool and abdominal distention.  Musculoskeletal: Positive for joint swelling and arthralgias.  Neurological: Negative for dizziness, syncope, speech difficulty, weakness, numbness and headaches.       Objective:   Physical Exam Blood pressure 142/84, pulse 66, temperature 97.2 F (36.2 C), resp. rate 18, height 5' 2.5" (1.588 m), weight 187 lb (84.823 kg). Body mass index is 33.66 kg/(m^2). Well-developed, well nourished WF who is awake, alert and oriented, in NAD. HEENT: [place]/AT, PERRL, EOMI.  Sclera and conjunctiva are clear. Nasal mucosa is pink and moist. OP is clear. Neck: supple, non-tender, no lymphadenopathy, thyromegaly. Heart: RRR, no murmur Lungs: CTA Abdomen: normo-active bowel sounds, supple, non-tender, no mass or organomegaly. Extremities: no cyanosis, clubbing or edema. Skin: warm and dry without rash. Musculoskeletal:  R ankle with mild effusion laterally; pain in all directions of R ankle; +TTP lateral malleolus; +TTP R 5th metatarsal; +TTP with metatarsal squeeze on R; anterior drawer negative R ankle.  Pain with internal rotation R  foot.  Normal gait. Psych: A&Ox3; judgement intact Neuro: CNII_XII intact; motor 5/5 x 4 extremities.   [facility] reading (PRIMARY) by  Dr. Tamala Julian.  ANKLE R FILM: no acute findings.       Assessment & Plan:   1. Essential hypertension, benign  CBC with Differential, Comprehensive metabolic panel  2. Pure hypercholesterolemia    3. Ankle pain, right  DG Foot Complete Right, DG Ankle Complete Right  4.  Colon Polyps 5.  DDD Cervical Spine  1.  HTN: controlled; no change in medications; obtain labs. 2.  Hypercholesterolemia: new; recommend weight loss, exercise, dietary modification.  Goal LDL < 100 with HTN history; repeat labs in six months; if LDL above goal at that time, will warrant statin therapy. 3.  R ankle/foot pain: new.  Injury two weeks ago; obtain R ankle/foot films; recommend ice qhs x 15 minutes, elevation, supportive shoes; if no improvement in two weeks, to call for ortho referral.  4.  Colon polyps:  S/p colonoscopy 12/2011; will warrant repeat in 5 years. 5.  DDD Cervical Spine:  S/p cervical spine surgery with persistent RUE symptoms.  To follow up with NS in 03/2012.

## 2012-02-26 NOTE — Patient Instructions (Signed)
No change in blood pressure medications today. Recommend weight loss, exercise, low-cholesterol food choices.  Will need repeat cholesterol panel in six months; if cholesterol still elevated, will need to start cholesterol medication. We have obtained xrays of R foot/ankle today.  Recommend elevation of R foot; also recommend icing at night for 15 minutes.  Wear supportive shoes.  If no improvement in 2 weeks, please call office for ortho referral.

## 2012-02-29 ENCOUNTER — Telehealth: Payer: Self-pay

## 2012-02-29 NOTE — Telephone Encounter (Signed)
PT STATES SHE HAD SOME XRAYS DONE AND WOULD LIKE TO KNOW RESULTS. [place] [phone]

## 2012-02-29 NOTE — Telephone Encounter (Signed)
Called patient to advise she is instructed ice elevate rest for a little longer, to let us know if it does not improve. If she gets worse she will return to clinic

## 2012-03-03 ENCOUNTER — Encounter: Payer: Self-pay | Admitting: Family Medicine

## 2012-03-07 ENCOUNTER — Telehealth: Payer: Self-pay | Admitting: Family Medicine

## 2012-03-07 NOTE — Telephone Encounter (Signed)
I have called patient and she is feeling somewhat better.I advised ice elevation rest. If not well within next week or so, she will call back for referral. Patient wants you to know her mother passed away 2023-03-10 from Mesenteric ischemia  ( abdominal surg 2011  for bladder cancer).

## 2012-03-07 NOTE — Telephone Encounter (Signed)
Call --- 1.  Labs normal (blood sugar normal; kidney and liver functions normal; no evidence of anemia).  2.  Xrays normal (foot and ankle xrays normal; no fracture).  How is ankle pain?  If no improvement, recommend orthopedic evaluation.  Does she need a referral?  If so, to whom?  [facility]

## 2012-03-07 NOTE — Telephone Encounter (Signed)
I have left message for her to call back about this.

## 2012-03-08 NOTE — Progress Notes (Signed)
Reviewed and agree.

## 2012-03-17 HISTORY — PX: ESOPHAGOGASTRODUODENOSCOPY: SHX1529

## 2012-03-17 HISTORY — PX: COLONOSCOPY: SHX174

## 2012-05-16 ENCOUNTER — Telehealth: Payer: Self-pay

## 2012-05-16 NOTE — Telephone Encounter (Signed)
The patient called to ask Dr. Reginia Forts if she needed to have a mammogram every year or if she could wait a few years in between the exams.  Please call the patient at [phone].

## 2012-05-17 ENCOUNTER — Emergency Department (HOSPITAL_COMMUNITY)
Admission: EM | Admit: 2012-05-17 | Discharge: 2012-05-17 | Disposition: A | Discharge status: Home / Self Care | Payer: 59 | Attending: Emergency Medicine | Admitting: Emergency Medicine

## 2012-05-17 ENCOUNTER — Encounter (HOSPITAL_COMMUNITY): Payer: Self-pay | Admitting: Family Medicine

## 2012-05-17 DIAGNOSIS — Z860100 Personal history of colon polyps, unspecified: Secondary | ICD-10-CM | POA: Insufficient documentation

## 2012-05-17 DIAGNOSIS — Y990 Civilian activity done for income or pay: Secondary | ICD-10-CM | POA: Insufficient documentation

## 2012-05-17 DIAGNOSIS — Z87898 Personal history of other specified conditions: Secondary | ICD-10-CM | POA: Insufficient documentation

## 2012-05-17 DIAGNOSIS — K2270 Barrett's esophagus without dysplasia: Secondary | ICD-10-CM | POA: Insufficient documentation

## 2012-05-17 DIAGNOSIS — I10 Essential (primary) hypertension: Secondary | ICD-10-CM | POA: Insufficient documentation

## 2012-05-17 DIAGNOSIS — W278XXA Contact with other nonpowered hand tool, initial encounter: Secondary | ICD-10-CM | POA: Insufficient documentation

## 2012-05-17 DIAGNOSIS — K219 Gastro-esophageal reflux disease without esophagitis: Secondary | ICD-10-CM | POA: Insufficient documentation

## 2012-05-17 DIAGNOSIS — Z8619 Personal history of other infectious and parasitic diseases: Secondary | ICD-10-CM | POA: Insufficient documentation

## 2012-05-17 DIAGNOSIS — G4700 Insomnia, unspecified: Secondary | ICD-10-CM | POA: Insufficient documentation

## 2012-05-17 DIAGNOSIS — E559 Vitamin D deficiency, unspecified: Secondary | ICD-10-CM | POA: Insufficient documentation

## 2012-05-17 DIAGNOSIS — S61219A Laceration without foreign body of unspecified finger without damage to nail, initial encounter: Secondary | ICD-10-CM

## 2012-05-17 DIAGNOSIS — F17200 Nicotine dependence, unspecified, uncomplicated: Secondary | ICD-10-CM | POA: Insufficient documentation

## 2012-05-17 DIAGNOSIS — S61209A Unspecified open wound of unspecified finger without damage to nail, initial encounter: Secondary | ICD-10-CM | POA: Insufficient documentation

## 2012-05-17 DIAGNOSIS — Y929 Unspecified place or not applicable: Secondary | ICD-10-CM | POA: Insufficient documentation

## 2012-05-17 DIAGNOSIS — Z8739 Personal history of other diseases of the musculoskeletal system and connective tissue: Secondary | ICD-10-CM | POA: Insufficient documentation

## 2012-05-17 DIAGNOSIS — Z8669 Personal history of other diseases of the nervous system and sense organs: Secondary | ICD-10-CM | POA: Insufficient documentation

## 2012-05-17 DIAGNOSIS — Y9389 Activity, other specified: Secondary | ICD-10-CM | POA: Insufficient documentation

## 2012-05-17 DIAGNOSIS — Z79899 Other long term (current) drug therapy: Secondary | ICD-10-CM | POA: Insufficient documentation

## 2012-05-17 DIAGNOSIS — G47419 Narcolepsy without cataplexy: Secondary | ICD-10-CM | POA: Insufficient documentation

## 2012-05-17 DIAGNOSIS — Z8659 Personal history of other mental and behavioral disorders: Secondary | ICD-10-CM | POA: Insufficient documentation

## 2012-05-17 NOTE — Telephone Encounter (Signed)
Call -- recommend mammogram yearly.  KMS

## 2012-05-17 NOTE — Telephone Encounter (Signed)
Called patient advised yearly mammogram is best.

## 2012-05-17 NOTE — ED Provider Notes (Signed)
History   This chart was scribed for Richarda Blade, MD by Marin Comment . The patient was seen in room TR06C/TR06C. Patient's care was started at 1746.   CSN: [id number]  Arrival date & time 05/17/12  1641  First MD Initiated Contact with Patient 05/17/2012 1746.    Chief Complaint  Patient presents with  . Laceration    The history is provided by the patient. No language interpreter was used.  Emily Melton is a 51 y.o. female who presents to the Emergency Department complaining of 3 cm laceration to her left ring finger that occurred PTA. She states that she cut her left ring finger on a tape cutter at work. She has the finger wrapped and bleeding is controlled. She reports associated burning and decreased sensation. She denies any other injuries at this time.   Past Medical History  Diagnosis Date  . Depression   . Narcolepsy     w/o cataplexy  . GERD (gastroesophageal reflux disease)   . HTN (hypertension)   . 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 Chicken pox     childhood  . Migraine, unspecified, without mention of intractable migraine without mention of status migrainosus   . Periodic limb movement disorder   . Nonspecific abnormal electrocardiogram (ECG) (EKG)   . Other and unspecified disc disorder of cervical region   . Diaphragmatic hernia without mention of obstruction or gangrene   . Tobacco use disorder   . Barrett's esophagus   . Personal history of colonic polyps   . Allergic rhinitis, cause unspecified   . Insomnia, unspecified   . Unspecified vitamin D deficiency   . Other abnormal glucose   . Pre-operative cardiovascular examination     Past Surgical History  Procedure Date  . Cesarean section     x2  1984 and 1986  . Pilonidal cyst resection 1992  . Cholecystectomy   . Carpal tunnel release - bilateral   . Dilation and curettage of uterus     SAB  . Tubel ligation 2002  .  Breast biopsy 2005     R   Benign  . Fusion of cervical spine 2007    C5,C6,C7, and T1  . Laprascopy 2009    for RLQ pain:R oophorectomy performed no improvement in pain.   . Vaginal hysterectomy 2009    fibroid/endometriosis, one L ovary remaining  . R shoulder surgery 06/24/09    2 tears in rotator cuff,bone spurs. Dr. Modena Morrow  . Coccyx disorder 1997    persistant sciatica  . Spine surgery 09/2011    Cervical spine fusion  . Back surgery     Family History  Problem Relation Age of Onset  . Stroke      GF   . Bipolar disorder Mother   . Cancer Mother     bladder  . Hypertension Mother   . Colon polyps Mother   . Osteoarthritis Mother   . Diabetes Mother   . Osteoporosis      kyphosis  . Heart disease Father     angina ?possible AMI in past  . Hyperlipidemia Father     History  Substance Use Topics  . Smoking status: Current Every Day Smoker  . Smokeless tobacco: Not on file   Comment: smoked 1 ppd 30-35 years; quit in 2011  . Alcohol Use: Yes    OB History    Grav  Para Term Preterm Abortions TAB SAB Ect Mult Living                  Review of Systems  Constitutional: Negative for fever and chills.  Respiratory: Negative for shortness of breath.   Gastrointestinal: Negative for nausea and vomiting.  Skin: Positive for wound.  Neurological: Negative for weakness.  All other systems reviewed and are negative.    Allergies  St johns wort; Bupropion hcl; Celebrex; Lyrica; Methylphenidate hcl; Modafinil; and Rabeprazole sodium  Home Medications   Current Outpatient Rx  Name Route Sig Dispense Refill  . AMLODIPINE BESYLATE 5 MG PO TABS Oral Take 5 mg by mouth daily.      Marland Kitchen CETIRIZINE HCL 10 MG PO TABS Oral Take 10 mg by mouth daily.      Marland Kitchen VITAMIN D3 2000 UNITS PO TABS Oral Take 1 tablet by mouth daily.     . DEXLANSOPRAZOLE 60 MG PO CPDR Oral Take 60 mg by mouth daily.      Marland Kitchen HYDROCHLOROTHIAZIDE 12.5 MG PO TABS Oral Take 12.5 mg by mouth daily.      Marland Kitchen  HYOSCYAMINE SULFATE 0.125 MG PO TBDP Sublingual Place 0.125-0.25 mg under the tongue every 6 (six) hours as needed. For stomach cramps    . TART CHERRY ADVANCED PO Oral Take 1-2 tablets by mouth daily.    Marland Kitchen MONTELUKAST SODIUM 10 MG PO TABS Oral Take 10 mg by mouth at bedtime.     Marland Kitchen ONE-DAILY MULTI VITAMINS PO TABS Oral Take 1 tablet by mouth daily.    Marland Kitchen FISH OIL PO Oral Take 1,200 mg by mouth daily.    Marland Kitchen PRESCRIPTION MEDICATION Topical Apply 1 application topically 4 (four) times daily as needed. Compound medication for nerve pain; Apply to arms and shoulders    . PROPRANOLOL HCL 60 MG PO TABS Oral Take 60 mg by mouth 2 (two) times daily.      . PSYLLIUM 95 % PO PACK Oral Take 1 packet by mouth daily.    Marland Kitchen TIZANIDINE HCL 4 MG PO TABS Oral Take 4 mg by mouth at bedtime as needed. For muscle spasms      BP 166/95  Pulse 56  Temp 98.1 F (36.7 C) (Oral)  Resp 16  SpO2 95%  Physical Exam  Nursing note and vitals reviewed. Constitutional: She is oriented to person, place, and time. She appears well-developed and well-nourished. No distress.  HENT:  Head: Normocephalic and atraumatic.  Eyes: EOM are normal.  Neck: Neck supple. No tracheal deviation present.  Cardiovascular: Normal rate.   Pulmonary/Chest: Effort normal. No respiratory distress.  Musculoskeletal: Normal range of motion.       On left ring finger, a 3.5 cm oblique laceration on volar aspect crossing proximal flexion crease and terminating at distal flexion crease. Movement of finger intact.   Neurological: She is alert and oriented to person, place, and time.       Slight decreased sensation of ulnar aspect of distal left ring finger, with intact circulation.   Skin: Skin is warm and dry.  Psychiatric: She has a normal mood and affect. Her behavior is normal.    ED Course  Procedures (including critical care time)  LACERATION REPAIR Performed by: Richarda Blade, MD Consent: Verbal consent obtained. Risks and  benefits: risks, benefits and alternatives were discussed Patient identity confirmed: provided demographic data Time out performed prior to procedure Prepped and Draped in normal sterile fashion Wound explored Laceration Location: left  ring finger, anterior aspect Laceration Length: 3.5cm No Foreign Bodies seen or palpated Anesthesia: digit block Local anesthetic: 2% xylocaine digital block   Anesthetic total: 2 cc on each side of digit, 4 cc in total   Irrigation method: syringe Amount of cleaning: standard Number of sutures: 3  Technique: simple Patient tolerance: Patient tolerated the procedure well with no immediate complications.   DIAGNOSTIC STUDIES: Oxygen Saturation is 97% on room air, adequate by my interpretation.    COORDINATION OF CARE:  17:50-Discussed planned course of treatment with the patient including consultation with hand surgery prior to laceration repair, who is agreeable at this time.   18:01-Consultation with Dr. Ninfa Linden, hand surgery, who advised repairing laceration at this time and f/u with in the office next week.   18:40-Preformed laceration repair. Discussed keeping wound bandaged and clean. Pt to f/u with Dr. Ninfa Linden in his office next week.   Labs Reviewed - No data to display No results found.   1. Laceration of finger       MDM  Laceration, right middle finger with possible mild digital nerve injury. Patient stabilized with laceration closure, and followup arranged.   I personally performed the services described in this documentation, which was scribed in my presence. The recorded information has been reviewed and considered.      Plan: Followup with hand surgery in 4 days      Richarda Blade, MD 05/17/12 2108

## 2012-05-17 NOTE — ED Notes (Signed)
Pt was at work and cut left ring finger on a tape cutter. Pt finger wrapped and bleeding controlled. sts laceration is to the bone

## 2012-06-10 ENCOUNTER — Telehealth: Payer: Self-pay

## 2012-06-10 ENCOUNTER — Ambulatory Visit (INDEPENDENT_AMBULATORY_CARE_PROVIDER_SITE_OTHER): Payer: 59 | Admitting: Family Medicine

## 2012-06-10 VITALS — BP 128/80 | HR 64 | Temp 98.1°F | Resp 16 | Ht 62.0 in | Wt 183.0 lb

## 2012-06-10 DIAGNOSIS — R197 Diarrhea, unspecified: Secondary | ICD-10-CM

## 2012-06-10 DIAGNOSIS — R059 Cough, unspecified: Secondary | ICD-10-CM

## 2012-06-10 DIAGNOSIS — R110 Nausea: Secondary | ICD-10-CM

## 2012-06-10 DIAGNOSIS — R05 Cough: Secondary | ICD-10-CM

## 2012-06-10 DIAGNOSIS — R109 Unspecified abdominal pain: Secondary | ICD-10-CM

## 2012-06-10 LAB — POCT CBC
Granulocyte percent: 58.7 %G (ref 37–80)
HCT, POC: 41.1 % (ref 37.7–47.9)
Hemoglobin: 12.9 g/dL (ref 12.2–16.2)
Lymph, poc: 2.2 (ref 0.6–3.4)
MCH, POC: 29.7 pg (ref 27–31.2)
MCHC: 31.4 g/dL — AB (ref 31.8–35.4)
MCV: 94.7 fL (ref 80–97)
MID (cbc): 0.4 (ref 0–0.9)
MPV: 10.6 fL (ref 0–99.8)
POC Granulocyte: 3.7 (ref 2–6.9)
POC LYMPH PERCENT: 34.7 %L (ref 10–50)
POC MID %: 6.6 %M (ref 0–12)
Platelet Count, POC: 198 10*3/uL (ref 142–424)
RBC: 4.34 M/uL (ref 4.04–5.48)
RDW, POC: 13.6 %
WBC: 6.3 10*3/uL (ref 4.6–10.2)

## 2012-06-10 MED ORDER — HYDROCODONE-HOMATROPINE 5-1.5 MG/5ML PO SYRP: 5.0000 mL | ORAL_SOLUTION | ORAL | Status: DC | PRN

## 2012-06-10 NOTE — Telephone Encounter (Signed)
PT STATES SHE HAVE BEEN FEELING BAD AND DIDN'T KNOW IF SHE SHOULD BE WORKING. ADVISED PT TO COME IN BUT SHE JUST WANT TO KNOW IF SHE CAN WORK WITH HER ILLNESS WITH DIARRHEA, COLD SYMPTOMS PLEASE CALL [phone]

## 2012-06-10 NOTE — Progress Notes (Signed)
Subjective: 51 year old lady with history of Thursday developing diarrhea. Her husband got sick Wednesday night. She had multiple episodes of diarrhea. Vomiting twice. Improved gradually until Saturday. Was a little worse and it. Didn't feel it would work again today. She did actually work Thursday but did not work Friday or today. She does not usually get gastrointestinal bugs. However she does have a history of irritable bowel syndrome. She wondered whether she did take her cousin. Early on in the illness, but it has subsided. She's had some pains in her right abdomen ever since she had her hysterectomy some 4 years ago. She works as an Web designer. No dysuria she has had a cough developed since all this started.  Objective: Overweight lady, pleasant alert and no major distress at this time. She does appear pale. She says her husband said she looked pale. Her throat is clear. Neck supple without nodes. Chest clear. Heart regular without murmurs. Abdomen no bowel sounds, soft without masses. Mild tenderness right mid and upper abdomen.  Assessment: Diarrhea and vomiting Abdominal pain Cough  Plan: I think is probably viral syndrome. 2 she is pale and check a CBC. We'll just treat symptomatically. Results for orders placed in visit on 06/10/12  POCT CBC      Component Value Range   WBC 6.3  4.6 - 10.2 K/uL   Lymph, poc 2.2  0.6 - 3.4   POC LYMPH PERCENT 34.7  10 - 50 %L   MID (cbc) 0.4  0 - 0.9   POC MID % 6.6  0 - 12 %M   POC Granulocyte 3.7  2 - 6.9   Granulocyte percent 58.7  37 - 80 %G   RBC 4.34  4.04 - 5.48 M/uL   Hemoglobin 12.9  12.2 - 16.2 g/dL   HCT, POC 41.1  37.7 - 47.9 %   MCV 94.7  80 - 97 fL   MCH, POC 29.7  27 - 31.2 pg   MCHC 31.4 (*) 31.8 - 35.4 g/dL   RDW, POC 13.6     Platelet Count, POC 198  142 - 424 K/uL   MPV 10.6  0 - 99.8 fL

## 2012-06-10 NOTE — Telephone Encounter (Signed)
I would not want to work with her with these symptoms. She is advised to come in for this.

## 2012-06-10 NOTE — Patient Instructions (Addendum)
Drink plenty of fluids.  May take your fructosamine if desired.  Use the cough medicine as necessary.  If you get worse we will do further testing,

## 2012-08-06 ENCOUNTER — Ambulatory Visit: Payer: Self-pay | Admitting: Family Medicine

## 2012-08-21 ENCOUNTER — Telehealth: Payer: Self-pay | Admitting: *Deleted

## 2012-08-21 NOTE — Telephone Encounter (Signed)
Pt notified of mammogram reusults

## 2012-09-02 ENCOUNTER — Encounter: Payer: 59 | Admitting: Family Medicine

## 2012-10-01 ENCOUNTER — Encounter: Payer: Self-pay | Admitting: Family Medicine

## 2012-10-02 ENCOUNTER — Encounter: Payer: Self-pay | Admitting: Family Medicine

## 2012-10-23 ENCOUNTER — Encounter: Payer: Self-pay | Admitting: Family Medicine

## 2012-10-23 ENCOUNTER — Ambulatory Visit (INDEPENDENT_AMBULATORY_CARE_PROVIDER_SITE_OTHER): Payer: 59 | Admitting: Family Medicine

## 2012-10-23 VITALS — BP 145/82 | HR 68 | Temp 97.1°F | Resp 18 | Ht 61.0 in | Wt 188.0 lb

## 2012-10-23 DIAGNOSIS — Z0000 Encounter for general adult medical examination without abnormal findings: Secondary | ICD-10-CM

## 2012-10-23 DIAGNOSIS — L0293 Carbuncle, unspecified: Secondary | ICD-10-CM

## 2012-10-23 DIAGNOSIS — I10 Essential (primary) hypertension: Secondary | ICD-10-CM

## 2012-10-23 DIAGNOSIS — G4710 Hypersomnia, unspecified: Secondary | ICD-10-CM

## 2012-10-23 DIAGNOSIS — L0292 Furuncle, unspecified: Secondary | ICD-10-CM

## 2012-10-23 DIAGNOSIS — J0100 Acute maxillary sinusitis, unspecified: Secondary | ICD-10-CM

## 2012-10-23 LAB — COMPREHENSIVE METABOLIC PANEL
ALT: 18 U/L (ref 0–35)
AST: 21 U/L (ref 0–37)
Albumin: 4.4 g/dL (ref 3.5–5.2)
Alkaline Phosphatase: 68 U/L (ref 39–117)
BUN: 16 mg/dL (ref 6–23)
CO2: 30 mEq/L (ref 19–32)
Calcium: 10 mg/dL (ref 8.4–10.5)
Chloride: 105 mEq/L (ref 96–112)
Creat: 0.63 mg/dL (ref 0.50–1.10)
Glucose, Bld: 96 mg/dL (ref 70–99)
Potassium: 4 mEq/L (ref 3.5–5.3)
Sodium: 141 mEq/L (ref 135–145)
Total Bilirubin: 0.6 mg/dL (ref 0.3–1.2)
Total Protein: 6.7 g/dL (ref 6.0–8.3)

## 2012-10-23 LAB — LIPID PANEL
Cholesterol: 218 mg/dL — ABNORMAL HIGH (ref 0–200)
HDL: 49 mg/dL (ref >39)
LDL Cholesterol: 144 mg/dL — ABNORMAL HIGH (ref 0–99)
Total CHOL/HDL Ratio: 4.4 Ratio
Triglycerides: 123 mg/dL (ref <150)
VLDL: 25 mg/dL (ref 0–40)

## 2012-10-23 LAB — FOLATE: Folate: >20 ng/mL

## 2012-10-23 LAB — TSH: TSH: 1.047 u[IU]/mL (ref 0.350–4.500)

## 2012-10-23 LAB — VITAMIN B12: Vitamin B-12: 645 pg/mL (ref 211–911)

## 2012-10-23 MED ORDER — DOXYCYCLINE HYCLATE 100 MG PO CAPS: 100.0000 mg | ORAL_CAPSULE | Freq: Two times a day (BID) | ORAL | Status: DC

## 2012-10-23 MED ORDER — MUPIROCIN CALCIUM 2 % EX CREA: TOPICAL_CREAM | Freq: Three times a day (TID) | CUTANEOUS | Status: DC

## 2012-10-23 MED ORDER — AMLODIPINE BESYLATE 10 MG PO TABS: 10.0000 mg | ORAL_TABLET | Freq: Every day | ORAL | Status: DC

## 2012-10-23 NOTE — Patient Instructions (Addendum)
Routine general medical examination at a health care facility - Plan: CBC with Differential, Comprehensive metabolic panel, TSH, Hemoglobin A1c, Lipid panel, Vitamin B12, Folate, POCT urinalysis dipstick, Vitamin D, 25-hydroxy  Essential hypertension, benign - Plan: amLODipine (NORVASC) 10 MG tablet  Sinusitis, acute, maxillary - Plan: doxycycline (VIBRAMYCIN) 100 MG capsule, mupirocin cream (BACTROBAN) 2 %  [place] - Plan: doxycycline (VIBRAMYCIN) 100 MG capsule, mupirocin cream (BACTROBAN) 2 %  Hypersomnolence

## 2012-10-23 NOTE — Progress Notes (Signed)
[place], [place]  [address]   [phone]  Subjective:    Patient ID: Emily Melton, female    DOB: 12/06/1960, 52 y.o.   MRN: [id number]  HPI This 52 y.o. female presents for CPE.  Last physical 08/2011.  Pap smear 07/15/09. Mammogram 08/06/2012. Colonoscopy 03/2012.  EGD: no Barrett's pesent. TDAP 08/01/10. Flu vaccine 03/2012 at work. Eye exam 2013; +glasses; no glaucoma or cataracts. Dental exam six months ago; appointment this month.  GI follow-up: s/p consult this week; scheduled abdominal u/s.  Ordered labs also for LFTs.    URI: onset two months ago; taking Zyrtec and Flonase; mostly in mornings, blows nose with clear, yellow, green mucous.  No improvement.  No coughing.  No sinus pressure.  Sores in nose 3-4 weeks ago.  Will not go away; picking at them.     Review of Systems  Constitutional: Positive for fatigue. Negative for fever, chills, diaphoresis, activity change, appetite change and unexpected weight change.  HENT: Positive for congestion, rhinorrhea, sneezing, neck pain, neck stiffness, postnasal drip and sinus pressure. Negative for hearing loss, ear pain, nosebleeds, facial swelling, drooling, mouth sores, dental problem, tinnitus and ear discharge.   Eyes: Negative for photophobia, pain, discharge, redness, itching and visual disturbance.  Respiratory: Negative for apnea, cough, choking, chest tightness, shortness of breath, wheezing and stridor.   Cardiovascular: Negative for chest pain, palpitations and leg swelling.  Gastrointestinal: Negative for abdominal pain, constipation, blood in stool, abdominal distention and anal bleeding.  Endocrine: Negative for cold intolerance, heat intolerance, polydipsia, polyphagia and polyuria.  Genitourinary: Negative for dysuria, urgency, frequency, hematuria, flank pain, decreased urine volume, vaginal bleeding, vaginal discharge, enuresis, difficulty urinating, genital sores, vaginal pain, menstrual  problem, pelvic pain and dyspareunia.  Musculoskeletal: Positive for myalgias, back pain, joint swelling and arthralgias. Negative for gait problem.  Skin: Negative for color change, pallor, rash and wound.  Neurological: Positive for numbness and headaches. Negative for dizziness, tremors, seizures, syncope, facial asymmetry, speech difficulty, weakness and light-headedness.  Hematological: Negative for adenopathy. Does not bruise/bleed easily.  Psychiatric/Behavioral: Negative for suicidal ideas, hallucinations, behavioral problems, confusion, sleep disturbance, self-injury, dysphoric mood, decreased concentration and agitation. The patient is not nervous/anxious and is not hyperactive.     Past Medical History  Diagnosis Date  . Depression   . Narcolepsy     w/o cataplexy  . GERD (gastroesophageal reflux disease)   . HTN (hypertension)   . 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 Chicken pox     childhood  . Migraine, unspecified, without mention of intractable migraine without mention of status migrainosus   . Periodic limb movement disorder   . Nonspecific abnormal electrocardiogram (ECG) (EKG)   . Other and unspecified disc disorder of cervical region   . Diaphragmatic hernia without mention of obstruction or gangrene   . Tobacco use disorder   . Barrett's esophagus   . Personal history of colonic polyps   . Allergic rhinitis, cause unspecified   . Insomnia, unspecified   . Unspecified vitamin D deficiency   . Other abnormal glucose   . Pre-operative cardiovascular examination   . Anemia     Past Surgical History  Procedure Laterality Date  . Cesarean section      x2  1984 and 1986  . Pilonidal cyst resection  1992  . Cholecystectomy    . Carpal tunnel release - bilateral    .  Dilation and curettage of uterus      SAB  . Tubel ligation  2002  . Breast biopsy  2005     R   Benign  . Fusion of cervical spine  2007     C5,C6,C7, and T1  . Laprascopy  2009    for RLQ pain:R oophorectomy performed no improvement in pain.   . Vaginal hysterectomy  2009    fibroid/endometriosis, one L ovary remaining  . R shoulder surgery  06/24/09    2 tears in rotator cuff,bone spurs. Dr. Modena Morrow  . 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 Spine surgery  09/2011    Cervical spine fusion  . Back surgery    . Tubal ligation    . Abdominal hysterectomy      fibroids.  ovarian resection R.    Prior to Admission medications   Medication Sig Start Date End Date Taking? Authorizing Provider  B Complex Vitamins (B COMPLEX 50 PO) Take 50 mg by mouth daily.   Yes Historical Provider, MD  cetirizine (ZYRTEC) 10 MG tablet Take 10 mg by mouth daily.     Yes Historical Provider, MD  Cholecalciferol (VITAMIN D3) 2000 UNITS TABS Take 1 tablet by mouth daily.    Yes Historical Provider, MD  dexlansoprazole (DEXILANT) 60 MG capsule Take 60 mg by mouth daily.     Yes Historical Provider, MD  montelukast (SINGULAIR) 10 MG tablet Take 10 mg by mouth at bedtime.    Yes Historical Provider, MD  Multiple Vitamin (MULTIVITAMIN) tablet Take 1 tablet by mouth daily.   Yes Historical Provider, MD  Omega-3 Fatty Acids (FISH OIL PO) Take 1,200 mg by mouth daily.   Yes Historical Provider, MD  psyllium (HYDROCIL/METAMUCIL) 95 % PACK Take 1 packet by mouth daily.   Yes Historical Provider, MD  tiZANidine (ZANAFLEX) 4 MG tablet Take 4 mg by mouth at bedtime as needed. For muscle spasms   Yes Historical Provider, MD  amLODipine (NORVASC) 5 MG tablet Take 1 tablet (5 mg total) by mouth daily. 11/11/12   Wardell Honour, MD  doxycycline (VIBRAMYCIN) 100 MG capsule Take 1 capsule (100 mg total) by mouth 2 (two) times daily. 10/23/12   Wardell Honour, MD  hydrochlorothiazide (HYDRODIURIL) 25 MG tablet Take 1 tablet (25 mg total) by mouth daily. 11/11/12   Wardell Honour, MD  hyoscyamine (ANASPAZ) 0.125 MG TBDP Place 0.125-0.25 mg under the tongue every 6  (six) hours as needed. For stomach cramps    Historical Provider, MD  mupirocin cream (BACTROBAN) 2 % Apply topically 3 (three) times daily. 10/23/12   Wardell Honour, MD  propranolol (INDERAL) 60 MG tablet TAKE 1 TABLET TWICE A DAY FOR BLOOD PRESSURE 11/08/12   Wardell Honour, MD    Allergies  Allergen Reactions  . St Johns Wort Anaphylaxis  . Bupropion Hcl   . Celebrex (Celecoxib)   . Lyrica (Pregabalin)   . Methylphenidate Hcl   . Modafinil   . Rabeprazole Sodium     History   Social History  . Marital Status: Married    Spouse Name: N/A    Number of Children: 2  . Years of Education: N/A   Occupational History  . Shipping Claims Analyst    Social History Main Topics  . Smoking status: Current Every Day Smoker  . Smokeless tobacco: Not on file     Comment: smoked 1 ppd 30-35 years; quit in 2011  . Alcohol  Use: Yes  . Drug Use: No  . Sexually Active: Yes    Birth Control/ Protection: Surgical   Other Topics Concern  . Not on file   Social History Narrative   Patient exercises 3-4 times per week (walking x 20 minutes 3 days a week).    Marital status:  Married x 10 years; happily married; second marriage; no abuse.      Children:  2 children(30, 28); three grandsons.      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; call center as of 2014.        Tobacco:  1/2 ppd x 34 years.       Alcohol: rare       Drugs: none      Exercise:  Rare       lives in [place].           Family History  Problem Relation Age of Onset  . Bipolar disorder Mother   . Cancer Mother     bladder  . Hypertension Mother   . Colon polyps Mother   . Osteoarthritis Mother   . Diabetes Mother   . Thyroid disease Mother   . Heart disease Father     angina ?possible AMI in past  . Hyperlipidemia Father   . Cancer Father     skin  . Angina Father   . Asthma Daughter   . Cancer Maternal Grandmother     colon  . Cancer Maternal Grandfather     lung  . Parkinson's  disease Maternal Grandfather   . Stroke Maternal Grandfather   . Rheum arthritis Paternal Grandmother   . Heart attack Paternal Grandfather        Objective:   Physical Exam  Nursing note and vitals reviewed. Constitutional: She is oriented to person, place, and time. She appears well-developed and well-nourished. No distress.  HENT:  Head: Normocephalic and atraumatic.  Right Ear: External ear normal.  Left Ear: External ear normal.  Nose: Mucosal edema and rhinorrhea present. Right sinus exhibits maxillary sinus tenderness. Right sinus exhibits no frontal sinus tenderness. Left sinus exhibits maxillary sinus tenderness. Left sinus exhibits no frontal sinus tenderness.  Mouth/Throat: Oropharynx is clear and moist.  Eyes: Conjunctivae and EOM are normal. Pupils are equal, round, and reactive to light.  Neck: Normal range of motion. Neck supple. No JVD present. No thyromegaly present.  Cardiovascular: Normal rate, regular rhythm, normal heart sounds and intact distal pulses.  Exam reveals no gallop and no friction rub.   No murmur heard. Pulmonary/Chest: Effort normal and breath sounds normal. She has no wheezes. She has no rales.  Abdominal: Soft. Bowel sounds are normal. She exhibits no distension and no mass. There is no tenderness. There is no rebound and no guarding.  Genitourinary: Vagina normal. No breast swelling, tenderness, discharge or bleeding. There is no rash, tenderness or lesion on the right labia. There is no rash, tenderness or lesion on the left labia. Right adnexum displays no mass, no tenderness and no fullness. Left adnexum displays no mass, no tenderness and no fullness.  Lymphadenopathy:    She has no cervical adenopathy.  Neurological: She is alert and oriented to person, place, and time. She has normal reflexes. No cranial nerve deficit. She exhibits normal muscle tone. Coordination normal.  Skin: Skin is warm and dry. No rash noted. She is not diaphoretic. No  erythema. No pallor.  +pustular lesions scattered.  Psychiatric: She has a normal mood  and affect. Her behavior is normal. Judgment and thought content normal.       Assessment & Plan:  Routine general medical examination at a health care facility - Plan: CBC with Differential, Comprehensive metabolic panel, TSH, Hemoglobin A1c, Lipid panel, Vitamin B12, Folate, POCT urinalysis dipstick, Vitamin D, 25-hydroxy  Essential hypertension, benign - Plan: DISCONTINUED: amLODipine (NORVASC) 10 MG tablet  Sinusitis, acute, maxillary - Plan: doxycycline (VIBRAMYCIN) 100 MG capsule, mupirocin cream (BACTROBAN) 2 %  [place] - Plan: doxycycline (VIBRAMYCIN) 100 MG capsule, mupirocin cream (BACTROBAN) 2 %  Hypersomnolence

## 2012-10-24 LAB — CBC WITH DIFFERENTIAL/PLATELET
Basophils Absolute: 0.1 10*3/uL (ref 0.0–0.1)
Basophils Relative: 1 % (ref 0–1)
Eosinophils Absolute: 0.1 10*3/uL (ref 0.0–0.7)
Eosinophils Relative: 2 % (ref 0–5)
HCT: 39.9 % (ref 36.0–46.0)
Hemoglobin: 13.1 g/dL (ref 12.0–15.0)
Lymphocytes Relative: 31 % (ref 12–46)
Lymphs Abs: 1.8 10*3/uL (ref 0.7–4.0)
MCH: 29.1 pg (ref 26.0–34.0)
MCHC: 32.8 g/dL (ref 30.0–36.0)
MCV: 88.7 fL (ref 78.0–100.0)
Monocytes Absolute: 0.3 10*3/uL (ref 0.1–1.0)
Monocytes Relative: 6 % (ref 3–12)
Neutro Abs: 3.6 10*3/uL (ref 1.7–7.7)
Neutrophils Relative %: 60 % (ref 43–77)
Platelets: 194 10*3/uL (ref 150–400)
RBC: 4.5 MIL/uL (ref 3.87–5.11)
RDW: 13.7 % (ref 11.5–15.5)
WBC: 6 10*3/uL (ref 4.0–10.5)

## 2012-10-24 LAB — HEMOGLOBIN A1C
Hgb A1c MFr Bld: 5.6 % (ref <5.7)
Mean Plasma Glucose: 114 mg/dL (ref <117)

## 2012-10-24 LAB — VITAMIN D 25 HYDROXY (VIT D DEFICIENCY, FRACTURES): Vit D, 25-Hydroxy: 54 ng/mL (ref 30–89)

## 2012-10-25 ENCOUNTER — Telehealth: Payer: Self-pay

## 2012-10-25 NOTE — Telephone Encounter (Signed)
Called her, she was given Bactroban. The directions indicate not to use in nose, but this is what it was given for, advised okay to use in nose as directed.

## 2012-10-25 NOTE — Telephone Encounter (Signed)
PT WOULD LIKE TO SPEAK WITH SOMEONE REGARDING THE CREAM GIVEN TO HER BY THE DR. Mancelona [phone]

## 2012-10-31 ENCOUNTER — Telehealth: Payer: Self-pay | Admitting: Radiology

## 2012-10-31 NOTE — Telephone Encounter (Signed)
Patient reports mild swelling in ankles since increase of Norvasc, please advise. Not painful, reports minimal around ankles only.

## 2012-11-01 NOTE — Telephone Encounter (Signed)
Call --- if swelling remains minimal, continue current dose of Norvasc.  If swelling becomes more significant, please have her contact office for switch to different medication.

## 2012-11-01 NOTE — Telephone Encounter (Signed)
Called left  Message for her to call me back.

## 2012-11-01 NOTE — Telephone Encounter (Signed)
Patient called back and is advised.

## 2012-11-08 ENCOUNTER — Other Ambulatory Visit: Payer: Self-pay | Admitting: Family Medicine

## 2012-11-08 NOTE — Telephone Encounter (Signed)
Dr Tamala Julian, I just wanted to make sure that you still want pt on propanolol. It is listed on her med list at her 10/23/12 OV, but under "Plan" you only list amlodipine. Do you want her on both?

## 2012-11-11 ENCOUNTER — Telehealth: Payer: Self-pay

## 2012-11-11 ENCOUNTER — Ambulatory Visit: Payer: Self-pay | Admitting: Gastroenterology

## 2012-11-11 DIAGNOSIS — I10 Essential (primary) hypertension: Secondary | ICD-10-CM

## 2012-11-11 MED ORDER — HYDROCHLOROTHIAZIDE 25 MG PO TABS: 25.0000 mg | ORAL_TABLET | Freq: Every day | ORAL | Status: DC

## 2012-11-11 MED ORDER — AMLODIPINE BESYLATE 5 MG PO TABS: 5.0000 mg | ORAL_TABLET | Freq: Every day | ORAL | Status: DC

## 2012-11-11 NOTE — Telephone Encounter (Signed)
Pt is calling in regards to a medication she is on and she was told to call back if her swelling in ankles and feet was worse and they are. Call back number is [phone]

## 2012-11-11 NOTE — Telephone Encounter (Signed)
Call --- 1. Decrease Amlodipine to 43m daily (can 1/2  168mtablets until gone; I will call in rx for Amlodipine 93m86maily).  2. Increase Hydrochlorathiazide from  12.93mg52m 293mg60mly (can start taking 2  12.93mg t67mets until gone and then fill 293mg t11mts).

## 2012-11-11 NOTE — Telephone Encounter (Signed)
Amlodipine 25m increased swelling feet/ ankles/ please advise.

## 2012-11-11 NOTE — Telephone Encounter (Signed)
Patient advised.

## 2012-11-17 ENCOUNTER — Encounter: Payer: Self-pay | Admitting: Family Medicine

## 2012-11-17 DIAGNOSIS — J0100 Acute maxillary sinusitis, unspecified: Secondary | ICD-10-CM | POA: Insufficient documentation

## 2012-11-17 DIAGNOSIS — Z0000 Encounter for general adult medical examination without abnormal findings: Secondary | ICD-10-CM | POA: Insufficient documentation

## 2012-11-17 DIAGNOSIS — I10 Essential (primary) hypertension: Secondary | ICD-10-CM | POA: Insufficient documentation

## 2012-11-17 DIAGNOSIS — G4700 Insomnia, unspecified: Secondary | ICD-10-CM | POA: Insufficient documentation

## 2012-11-17 DIAGNOSIS — L0293 Carbuncle, unspecified: Secondary | ICD-10-CM | POA: Insufficient documentation

## 2012-11-17 NOTE — Assessment & Plan Note (Signed)
Moderately controlled; increase Amlodipine to 68m daily.  To call if develops lower extremity edema.

## 2012-11-17 NOTE — Assessment & Plan Note (Signed)
New. Rx for Doxycycline provided.  Local wound care. RTC for acute worsening.

## 2012-11-17 NOTE — Assessment & Plan Note (Signed)
Chronic with previous diagnosis of narcolepsy per the patient; refer to Dr. Brett Fairy of Sleep Medicine for sleep study and other testing.

## 2012-11-17 NOTE — Assessment & Plan Note (Signed)
New.  Rx for Doxycycline provided; recommend continued nasal steroid, antihistamine.

## 2012-11-17 NOTE — Assessment & Plan Note (Signed)
Anticipatory guidance --- weight loss, exercise, smoking cessation.  Pap smear UTD and no longer warranted due to hysterectomy status.  Mammogram UTD.  Colonoscopy UTD.  Immunizations UTD.  Obtain labs.

## 2013-02-10 ENCOUNTER — Other Ambulatory Visit: Payer: Self-pay

## 2013-02-10 MED ORDER — MONTELUKAST SODIUM 10 MG PO TABS: 10.0000 mg | ORAL_TABLET | Freq: Every day | ORAL | Status: DC

## 2013-02-13 ENCOUNTER — Ambulatory Visit (INDEPENDENT_AMBULATORY_CARE_PROVIDER_SITE_OTHER): Payer: 59 | Admitting: Family Medicine

## 2013-02-13 VITALS — BP 170/96 | HR 60 | Temp 97.8°F | Resp 16 | Ht 62.0 in | Wt 198.8 lb

## 2013-02-13 DIAGNOSIS — I10 Essential (primary) hypertension: Secondary | ICD-10-CM

## 2013-02-13 DIAGNOSIS — J029 Acute pharyngitis, unspecified: Secondary | ICD-10-CM

## 2013-02-13 DIAGNOSIS — R590 Localized enlarged lymph nodes: Secondary | ICD-10-CM

## 2013-02-13 DIAGNOSIS — R599 Enlarged lymph nodes, unspecified: Secondary | ICD-10-CM

## 2013-02-13 LAB — POCT CBC
Granulocyte percent: 58.1 %G (ref 37–80)
HCT, POC: 43.3 % (ref 37.7–47.9)
Hemoglobin: 14.1 g/dL (ref 12.2–16.2)
Lymph, poc: 2.8 (ref 0.6–3.4)
MCH, POC: 31.1 pg (ref 27–31.2)
MCHC: 32.6 g/dL (ref 31.8–35.4)
MCV: 95.6 fL (ref 80–97)
MID (cbc): 0.7 (ref 0–0.9)
MPV: 12.1 fL (ref 0–99.8)
POC Granulocyte: 4.9 (ref 2–6.9)
POC LYMPH PERCENT: 33.8 %L (ref 10–50)
POC MID %: 8.1 %M (ref 0–12)
Platelet Count, POC: 199 10*3/uL (ref 142–424)
RBC: 4.53 M/uL (ref 4.04–5.48)
RDW, POC: 13.3 %
WBC: 8.4 10*3/uL (ref 4.6–10.2)

## 2013-02-13 LAB — POCT RAPID STREP A (OFFICE): Rapid Strep A Screen: NEGATIVE

## 2013-02-13 MED ORDER — AMOXICILLIN-POT CLAVULANATE 875-125 MG PO TABS: 1.0000 | ORAL_TABLET | Freq: Two times a day (BID) | ORAL | Status: DC

## 2013-02-13 NOTE — Progress Notes (Signed)
[place], [place]  [address]   [phone]  Subjective:    Patient ID: Emily Melton, female    DOB: Jan 23, 1961, 52 y.o.   MRN: [id number]  HPI This 52 y.o. female presents for evaluation of sore throat, ear pain. Onset three weeks ago.  Husband made [place] tea; glands started hurting.  R ear pain; feels like something in R ear but may be swelling.  At work, has ear piece; unable to wear ear piece in R ear due to pain.  Throat a little sore; +hoarseness but talks at call center.  No sneezing, congestion, fever.  +fatigue but not sleeping well.   Under chin is aggravated.  No fever 98.4; +sweats.  +HA a lot due to R nerve pain from neck.  Mild PND.  Taking Zyrtec and Singulair daily.  Mild cough; +tobacco; no v/d.  Glands hurt with coughing.  Taking Cepachol and gargling with salt water.  Drinking tea with honey.    2.  HTN: increased Norvasc to 34m no 4/10; suffered with B leg swelling; called and advised to decrease Amlodipine to 553m increased HCTZ to 2517maily.  Developed rash on top of feet since April. Home BP running 143/84-130/80.     Review of Systems  Constitutional: Positive for diaphoresis and fatigue. Negative for fever and chills.  HENT: Positive for ear pain, sore throat, neck pain, neck stiffness, voice change and postnasal drip. Negative for congestion, facial swelling, rhinorrhea and trouble swallowing.   Respiratory: Positive for cough. Negative for shortness of breath, wheezing and stridor.   Cardiovascular: Positive for leg swelling. Negative for chest pain and palpitations.  Gastrointestinal: Negative for nausea, vomiting, abdominal pain and diarrhea.  Skin: Positive for rash.  Neurological: Positive for numbness and headaches. Negative for dizziness, tremors, seizures, syncope, speech difficulty, weakness and light-headedness.    Past Medical History  Diagnosis Date  . Depression   . Narcolepsy     w/o cataplexy  . GERD (gastroesophageal reflux  disease)   . HTN (hypertension)   . 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 Chicken pox     childhood  . Migraine, unspecified, without mention of intractable migraine without mention of status migrainosus   . Periodic limb movement disorder   . Nonspecific abnormal electrocardiogram (ECG) (EKG)   . Other and unspecified disc disorder of cervical region   . Diaphragmatic hernia without mention of obstruction or gangrene   . Tobacco use disorder   . Barrett's esophagus   . Personal history of colonic polyps   . Allergic rhinitis, cause unspecified   . Insomnia, unspecified   . Unspecified vitamin D deficiency   . Other abnormal glucose   . Pre-operative cardiovascular examination   . Anemia     Past Surgical History  Procedure Laterality Date  . Cesarean section      x2  1984 and 1986  . Pilonidal cyst resection  1992  . Cholecystectomy    . Carpal tunnel release - bilateral    . Dilation and curettage of uterus      SAB  . Tubel ligation  2002  . Breast biopsy  2005     R   Benign  . Fusion of cervical spine  2007    C5,C6,C7, and T1  . Laprascopy  2009    for RLQ pain:R oophorectomy performed no improvement in pain.   .Marland Kitchen  Vaginal hysterectomy  2009    fibroid/endometriosis, one L ovary remaining  . R shoulder surgery  06/24/09    2 tears in rotator cuff,bone spurs. Dr. Modena Morrow  . 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 Spine surgery  09/2011    Cervical spine fusion  . Back surgery    . Tubal ligation    . Abdominal hysterectomy      fibroids.  ovarian resection R.  . Colonoscopy  03/17/2012    [facility]  . Esophagogastroduodenoscopy  03/17/2012    no Barrett's esophagus. [facility].    Prior to Admission medications   Medication Sig Start Date End Date Taking? Authorizing Provider  amLODipine (NORVASC) 5 MG tablet Take 1 tablet (5 mg total) by mouth daily. 11/11/12  Yes Wardell Honour, MD  B Complex  Vitamins (B COMPLEX 50 PO) Take 50 mg by mouth daily.   Yes Historical Provider, MD  cetirizine (ZYRTEC) 10 MG tablet Take 10 mg by mouth daily.     Yes Historical Provider, MD  Cholecalciferol (VITAMIN D3) 2000 UNITS TABS Take 1 tablet by mouth daily.    Yes Historical Provider, MD  dexlansoprazole (DEXILANT) 60 MG capsule Take 60 mg by mouth daily.     Yes Historical Provider, MD  hydrochlorothiazide (HYDRODIURIL) 25 MG tablet Take 1 tablet (25 mg total) by mouth daily. 11/11/12  Yes Wardell Honour, MD  hyoscyamine (ANASPAZ) 0.125 MG TBDP Place 0.125-0.25 mg under the tongue every 6 (six) hours as needed. For stomach cramps   Yes Historical Provider, MD  montelukast (SINGULAIR) 10 MG tablet Take 1 tablet (10 mg total) by mouth at bedtime. 02/10/13  Yes Wardell Honour, MD  Multiple Vitamin (MULTIVITAMIN) tablet Take 1 tablet by mouth daily.   Yes Historical Provider, MD  Omega-3 Fatty Acids (FISH OIL PO) Take 1,200 mg by mouth daily.   Yes Historical Provider, MD  propranolol (INDERAL) 60 MG tablet TAKE 1 TABLET TWICE A DAY FOR BLOOD PRESSURE 11/08/12  Yes Wardell Honour, MD  psyllium (HYDROCIL/METAMUCIL) 95 % PACK Take 1 packet by mouth daily.   Yes Historical Provider, MD  tiZANidine (ZANAFLEX) 4 MG tablet Take 4 mg by mouth at bedtime as needed. For muscle spasms   Yes Historical Provider, MD  doxycycline (VIBRAMYCIN) 100 MG capsule Take 1 capsule (100 mg total) by mouth 2 (two) times daily. 10/23/12   Wardell Honour, MD  mupirocin cream (BACTROBAN) 2 % Apply topically 3 (three) times daily. 10/23/12   Wardell Honour, MD    Allergies  Allergen Reactions  . St Johns Wort Anaphylaxis  . Bupropion Hcl   . Celebrex (Celecoxib)   . Lyrica (Pregabalin)   . Methylphenidate Hcl   . Modafinil   . Rabeprazole Sodium     History   Social History  . Marital Status: Married    Spouse Name: N/A    Number of Children: 2  . Years of Education: N/A   Occupational History  . Shipping Claims Analyst      Social History Main Topics  . Smoking status: Current Every Day Smoker  . Smokeless tobacco: Not on file     Comment: smoked 1 ppd 30-35 years; quit in 2011  . Alcohol Use: Yes  . Drug Use: No  . Sexually Active: Yes    Birth Control/ Protection: Surgical   Other Topics Concern  . Not on file   Social History Narrative   Patient exercises  3-4 times per week (walking x 20 minutes 3 days a week).    Marital status:  Married x 10 years; happily married; second marriage; no abuse.      Children:  2 children(30, 28); three grandsons.      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; call center as of 2014.        Tobacco:  1/2 ppd x 34 years.       Alcohol: rare       Drugs: none      Exercise:  Rare       lives in [place].           Family History  Problem Relation Age of Onset  . Bipolar disorder Mother   . Cancer Mother     bladder  . Hypertension Mother   . Colon polyps Mother   . Osteoarthritis Mother   . Diabetes Mother   . Thyroid disease Mother   . Heart disease Father     angina ?possible AMI in past  . Hyperlipidemia Father   . Cancer Father     skin  . Angina Father   . Asthma Daughter   . Cancer Maternal Grandmother     colon  . Cancer Maternal Grandfather     lung  . Parkinson's disease Maternal Grandfather   . Stroke Maternal Grandfather   . Rheum arthritis Paternal Grandmother   . Heart attack Paternal Grandfather        Objective:   Physical Exam  Nursing note and vitals reviewed. Constitutional: She appears well-developed and well-nourished. No distress.  HENT:  Head: Normocephalic and atraumatic.  Right Ear: External ear normal.  Left Ear: External ear normal.  Nose: Nose normal.  Mouth/Throat: Oropharynx is clear and moist. No oropharyngeal exudate.  Eyes: Conjunctivae and EOM are normal. Pupils are equal, round, and reactive to light.  Neck: Neck supple.  Cardiovascular: Normal rate, regular rhythm, normal heart  sounds and intact distal pulses.   No murmur heard. Pulmonary/Chest: Effort normal and breath sounds normal.  Lymphadenopathy:    She has cervical adenopathy.  Skin: Skin is warm and dry. Rash noted. She is not diaphoretic.  Petechial, non-blanching rash B dorsal aspect feet.  Psychiatric: She has a normal mood and affect. Her behavior is normal.   Results for orders placed in visit on 02/13/13  POCT CBC      Result Value Range   WBC 8.4  4.6 - 10.2 K/uL   Lymph, poc 2.8  0.6 - 3.4   POC LYMPH PERCENT 33.8  10 - 50 %L   MID (cbc) 0.7  0 - 0.9   POC MID % 8.1  0 - 12 %M   POC Granulocyte 4.9  2 - 6.9   Granulocyte percent 58.1  37 - 80 %G   RBC 4.53  4.04 - 5.48 M/uL   Hemoglobin 14.1  12.2 - 16.2 g/dL   HCT, POC 43.3  37.7 - 47.9 %   MCV 95.6  80 - 97 fL   MCH, POC 31.1  27 - 31.2 pg   MCHC 32.6  31.8 - 35.4 g/dL   RDW, POC 13.3     Platelet Count, POC 199  142 - 424 K/uL   MPV 12.1  0 - 99.8 fL  POCT RAPID STREP A (OFFICE)      Result Value Range   Rapid Strep A Screen Negative  Negative  Assessment & Plan:  Acute pharyngitis - Plan: POCT CBC, POCT rapid strep A, Epstein-Barr virus VCA antibody panel  LAD (lymphadenopathy), anterior cervical  Essential hypertension, benign - Plan: Comprehensive metabolic panel  1.  Acute Pharyngitis:  New. Associated with anterior LAD; send throat culture and EBV titers; treat empirically with Augmentin.  RTC for acute worsening. 2. Cervical LAD: New. Duration three weeks; send EBV titers; treat with Augmentin.  RTC if no improvement in 24 weeks. 3. HTN: uncontrolled today but improved recently at home.  No change to management; obtain CMET.   Meds ordered this encounter  Medications  . amoxicillin-clavulanate (AUGMENTIN) 875-125 MG per tablet    Sig: Take 1 tablet by mouth 2 (two) times daily.    Dispense:  20 tablet    Refill:  0

## 2013-02-14 LAB — COMPREHENSIVE METABOLIC PANEL
ALT: 24 U/L (ref 0–35)
AST: 26 U/L (ref 0–37)
Albumin: 4.6 g/dL (ref 3.5–5.2)
Alkaline Phosphatase: 70 U/L (ref 39–117)
BUN: 13 mg/dL (ref 6–23)
CO2: 29 mEq/L (ref 19–32)
Calcium: 9.9 mg/dL (ref 8.4–10.5)
Chloride: 102 mEq/L (ref 96–112)
Creat: 0.69 mg/dL (ref 0.50–1.10)
Glucose, Bld: 96 mg/dL (ref 70–99)
Potassium: 3.6 mEq/L (ref 3.5–5.3)
Sodium: 139 mEq/L (ref 135–145)
Total Bilirubin: 0.4 mg/dL (ref 0.3–1.2)
Total Protein: 7.1 g/dL (ref 6.0–8.3)

## 2013-02-15 LAB — EPSTEIN-BARR VIRUS VCA ANTIBODY PANEL
EBV EA IgG: 123 U/mL — ABNORMAL HIGH (ref <9.0)
EBV NA IgG: 373 U/mL — ABNORMAL HIGH (ref <18.0)
EBV VCA IgG: 328 U/mL — ABNORMAL HIGH (ref <18.0)
EBV VCA IgM: 10.3 U/mL (ref <36.0)

## 2013-02-16 LAB — CULTURE, GROUP A STREP: Organism ID, Bacteria: NORMAL

## 2013-04-29 ENCOUNTER — Ambulatory Visit: Payer: 59 | Admitting: Family Medicine

## 2013-04-30 ENCOUNTER — Encounter: Payer: Self-pay | Admitting: Family Medicine

## 2013-04-30 ENCOUNTER — Ambulatory Visit (INDEPENDENT_AMBULATORY_CARE_PROVIDER_SITE_OTHER): Payer: 59 | Admitting: Family Medicine

## 2013-04-30 ENCOUNTER — Encounter (INDEPENDENT_AMBULATORY_CARE_PROVIDER_SITE_OTHER): Payer: Self-pay

## 2013-04-30 VITALS — BP 152/84 | HR 69 | Temp 98.0°F | Resp 16 | Ht 62.0 in | Wt 192.0 lb

## 2013-04-30 DIAGNOSIS — G4710 Hypersomnia, unspecified: Secondary | ICD-10-CM

## 2013-04-30 DIAGNOSIS — M5030 Other cervical disc degeneration, unspecified cervical region: Secondary | ICD-10-CM

## 2013-04-30 DIAGNOSIS — Z23 Encounter for immunization: Secondary | ICD-10-CM

## 2013-04-30 DIAGNOSIS — I10 Essential (primary) hypertension: Secondary | ICD-10-CM

## 2013-04-30 DIAGNOSIS — L309 Dermatitis, unspecified: Secondary | ICD-10-CM

## 2013-04-30 DIAGNOSIS — E785 Hyperlipidemia, unspecified: Secondary | ICD-10-CM

## 2013-04-30 DIAGNOSIS — E7800 Pure hypercholesterolemia, unspecified: Secondary | ICD-10-CM

## 2013-04-30 DIAGNOSIS — L259 Unspecified contact dermatitis, unspecified cause: Secondary | ICD-10-CM

## 2013-04-30 LAB — COMPREHENSIVE METABOLIC PANEL
ALT: 20 U/L (ref 0–35)
AST: 24 U/L (ref 0–37)
Albumin: 4.2 g/dL (ref 3.5–5.2)
Alkaline Phosphatase: 66 U/L (ref 39–117)
BUN: 14 mg/dL (ref 6–23)
CO2: 29 mEq/L (ref 19–32)
Calcium: 9.6 mg/dL (ref 8.4–10.5)
Chloride: 104 mEq/L (ref 96–112)
Creat: 0.6 mg/dL (ref 0.50–1.10)
Glucose, Bld: 101 mg/dL — ABNORMAL HIGH (ref 70–99)
Potassium: 3.7 mEq/L (ref 3.5–5.3)
Sodium: 138 mEq/L (ref 135–145)
Total Bilirubin: 0.5 mg/dL (ref 0.3–1.2)
Total Protein: 6.5 g/dL (ref 6.0–8.3)

## 2013-04-30 LAB — CBC WITH DIFFERENTIAL/PLATELET
Basophils Absolute: 0 10*3/uL (ref 0.0–0.1)
Basophils Relative: 1 % (ref 0–1)
Eosinophils Absolute: 0.2 10*3/uL (ref 0.0–0.7)
Eosinophils Relative: 3 % (ref 0–5)
HCT: 37 % (ref 36.0–46.0)
Hemoglobin: 12.7 g/dL (ref 12.0–15.0)
Lymphocytes Relative: 36 % (ref 12–46)
Lymphs Abs: 2.1 10*3/uL (ref 0.7–4.0)
MCH: 29.7 pg (ref 26.0–34.0)
MCHC: 34.3 g/dL (ref 30.0–36.0)
MCV: 86.7 fL (ref 78.0–100.0)
Monocytes Absolute: 0.5 10*3/uL (ref 0.1–1.0)
Monocytes Relative: 8 % (ref 3–12)
Neutro Abs: 3 10*3/uL (ref 1.7–7.7)
Neutrophils Relative %: 52 % (ref 43–77)
Platelets: 197 10*3/uL (ref 150–400)
RBC: 4.27 MIL/uL (ref 3.87–5.11)
RDW: 13.8 % (ref 11.5–15.5)
WBC: 5.7 10*3/uL (ref 4.0–10.5)

## 2013-04-30 LAB — LIPID PANEL
Cholesterol: 223 mg/dL — ABNORMAL HIGH (ref 0–200)
HDL: 47 mg/dL (ref >39)
LDL Cholesterol: 146 mg/dL — ABNORMAL HIGH (ref 0–99)
Total CHOL/HDL Ratio: 4.7 Ratio
Triglycerides: 151 mg/dL — ABNORMAL HIGH (ref <150)
VLDL: 30 mg/dL (ref 0–40)

## 2013-04-30 MED ORDER — LOSARTAN POTASSIUM 25 MG PO TABS: 25.0000 mg | ORAL_TABLET | Freq: Every day | ORAL | Status: DC

## 2013-04-30 MED ORDER — KETOCONAZOLE 2 % EX CREA: TOPICAL_CREAM | Freq: Every day | CUTANEOUS | Status: DC

## 2013-04-30 NOTE — Progress Notes (Signed)
[place], [place]  [id number]   [id number]  Subjective:    Patient ID: Emily Melton, female    DOB: [birth year], 52 y.o.   MRN: [id number]  HPI This 52 y.o. female presents for six month follow-up:  1.  DDD cervical spine:  NS took out of work in August 2014.  Spine stimulator; discussed with NS at [facility].  Went to pain specialist; unable to do it due to prior fusion; neurologist was trying on various medications including patch, etc; intolerant to multiple medications; recommended discussing with NS at [facility].  Concerned about risk of spine stimulator; decided against stimulator.  Discussed disability.  Was suffering with severe pain with working six hours daily.  Would require major surgery for spine stimulator.  Very skeptical.  Follow-up with [employer] 04/18/13; agreed that risks outweighed benefits and would not address all pain; applying for long term disability.  Has previously been on long term disability with shoulder surgery.   Now taking Tizanidine; narcotics help but does not want to take daily.   Husband being supportive.    2.  HTN:  Pain contributes to elevated BP.  Checking Bp at home; took last week and was 140/78 L arm; 130/.  Taking Amlopdiine 63m daily.  HCTZ 284mdaily; Propanolol 6017m   3.  Narcolepsy:  Deferred appointment with sleep specialist due to chronic neck pain.  Less severe hypersomnolence.    4. Rash:  Still present on R>L foot; intermittently itchy.  5.  IBS: s/p abdominal u/s of side; follow-up in upcoming month; no call regarding u/s.  Still having RUQ pain.    6.  S/p flu vaccine.    Review of Systems  Constitutional: Negative for fever, chills, diaphoresis and fatigue.  HENT: Negative for ear pain, postnasal drip, rhinorrhea, sinus pressure, sore throat and trouble swallowing.   Respiratory: Negative for cough and shortness of breath.   Cardiovascular: Negative for chest pain, palpitations and leg swelling.  Gastrointestinal: Positive for  abdominal pain. Negative for nausea, vomiting, diarrhea and constipation.  Musculoskeletal: Positive for neck pain and neck stiffness.  Skin: Positive for rash.  Neurological: Negative for dizziness, syncope, weakness, light-headedness, numbness and headaches.       Objective:   Physical Exam  Nursing note and vitals reviewed. Constitutional: She is oriented to person, place, and time. She appears well-developed and well-nourished. No distress.  HENT:  Head: Normocephalic and atraumatic.  Right Ear: External ear normal.  Left Ear: External ear normal.  Nose: Nose normal.  Mouth/Throat: Oropharynx is clear and moist.  Eyes: Conjunctivae are normal. Pupils are equal, round, and reactive to light.  Neck: Normal range of motion. Neck supple. No thyromegaly present.  Cardiovascular: Normal rate, regular rhythm and normal heart sounds.  Exam reveals no gallop and no friction rub.   No murmur heard. Pulmonary/Chest: Effort normal and breath sounds normal. She has no wheezes. She has no rales.  Lymphadenopathy:    She has no cervical adenopathy.  Neurological: She is alert and oriented to person, place, and time.  Skin: Rash noted. She is not diaphoretic.  Mild scaling rash R foot diffusely.       Assessment & Plan:  Essential hypertension, benign - Plan: CBC with Differential, Comprehensive metabolic panel  Pure hypercholesterolemia - Plan: Lipid panel  Need for prophylactic vaccination and inoculation against influenza - Plan: Flu Vaccine QUAD 36+ mos IM  Hypersomnolence  Other and unspecified hyperlipidemia  DDD (degenerative disc disease),  cervical  Dermatitis  1. HTN: uncontrolled; rx for Losartan 73m daily provided. Obtain labs.  2.  Hyperlipidemia: uncontrolled; repeat FLP; recommend weight loss, low-cholesterol food choices. 3.  Hypersomnolence: improved; non-compliance with referral to neurology. 4.  DDD cervical: worsening; suffering with daily pain; having  difficulties with work; followed by NS. 5.  Dermatitis foot:  Persistent.  Rx for Ketoconoazole cream provided. 6. s /p flu vaccine.   Meds ordered this encounter  Medications  . ketoconazole (NIZORAL) 2 % cream    Sig: Apply topically daily.    Dispense:  60 g    Refill:  0  . DISCONTD: losartan (COZAAR) 25 MG tablet    Sig: Take 1 tablet (25 mg total) by mouth daily.    Dispense:  30 tablet    Refill:  5   KReginia Forts M.D.  Urgent [facility] Dy[address] StreetGPeavine [place]  [phone]

## 2013-05-06 ENCOUNTER — Other Ambulatory Visit: Payer: Self-pay | Admitting: Family Medicine

## 2013-05-08 ENCOUNTER — Encounter: Payer: Self-pay | Admitting: Family Medicine

## 2013-05-13 ENCOUNTER — Ambulatory Visit: Payer: Self-pay | Admitting: Gastroenterology

## 2013-05-15 ENCOUNTER — Telehealth: Payer: Self-pay | Admitting: Family Medicine

## 2013-05-15 NOTE — Telephone Encounter (Signed)
Call --- thanks for update.

## 2013-05-15 NOTE — Telephone Encounter (Signed)
Patient wanted to let you know: Seen GI on 23rd. Had U/S 6 months ago to compare to 2010. U/S showed large bile duct. Her GI said needs further testing. She had MR/CT on 28th. That showed 56m stones in bile duct. She has ERCP scheduled for Nov 6.

## 2013-05-22 ENCOUNTER — Ambulatory Visit: Payer: Self-pay | Admitting: Gastroenterology

## 2013-06-02 ENCOUNTER — Other Ambulatory Visit: Payer: Self-pay | Admitting: Family Medicine

## 2013-06-30 ENCOUNTER — Ambulatory Visit (INDEPENDENT_AMBULATORY_CARE_PROVIDER_SITE_OTHER): Payer: 59 | Admitting: Family Medicine

## 2013-06-30 ENCOUNTER — Encounter: Payer: Self-pay | Admitting: Family Medicine

## 2013-06-30 VITALS — BP 140/88 | HR 63 | Temp 98.0°F | Resp 16 | Ht 62.0 in | Wt 191.0 lb

## 2013-06-30 DIAGNOSIS — M5030 Other cervical disc degeneration, unspecified cervical region: Secondary | ICD-10-CM | POA: Insufficient documentation

## 2013-06-30 DIAGNOSIS — R7302 Impaired glucose tolerance (oral): Secondary | ICD-10-CM | POA: Insufficient documentation

## 2013-06-30 DIAGNOSIS — E7800 Pure hypercholesterolemia, unspecified: Secondary | ICD-10-CM

## 2013-06-30 DIAGNOSIS — R7309 Other abnormal glucose: Secondary | ICD-10-CM

## 2013-06-30 DIAGNOSIS — I10 Essential (primary) hypertension: Secondary | ICD-10-CM

## 2013-06-30 LAB — CBC WITH DIFFERENTIAL/PLATELET
Basophils Absolute: 0 10*3/uL (ref 0.0–0.1)
Basophils Relative: 1 % (ref 0–1)
Eosinophils Absolute: 0.2 10*3/uL (ref 0.0–0.7)
Eosinophils Relative: 3 % (ref 0–5)
HCT: 39.2 % (ref 36.0–46.0)
Hemoglobin: 13.6 g/dL (ref 12.0–15.0)
Lymphocytes Relative: 35 % (ref 12–46)
Lymphs Abs: 2.2 10*3/uL (ref 0.7–4.0)
MCH: 30.7 pg (ref 26.0–34.0)
MCHC: 34.7 g/dL (ref 30.0–36.0)
MCV: 88.5 fL (ref 78.0–100.0)
Monocytes Absolute: 0.4 10*3/uL (ref 0.1–1.0)
Monocytes Relative: 7 % (ref 3–12)
Neutro Abs: 3.5 10*3/uL (ref 1.7–7.7)
Neutrophils Relative %: 54 % (ref 43–77)
Platelets: 170 10*3/uL (ref 150–400)
RBC: 4.43 MIL/uL (ref 3.87–5.11)
RDW: 13.6 % (ref 11.5–15.5)
WBC: 6.3 10*3/uL (ref 4.0–10.5)

## 2013-06-30 LAB — COMPLETE METABOLIC PANEL WITH GFR
ALT: 19 U/L (ref 0–35)
AST: 22 U/L (ref 0–37)
Albumin: 4.4 g/dL (ref 3.5–5.2)
Alkaline Phosphatase: 73 U/L (ref 39–117)
BUN: 10 mg/dL (ref 6–23)
CO2: 28 mEq/L (ref 19–32)
Calcium: 9.9 mg/dL (ref 8.4–10.5)
Chloride: 105 mEq/L (ref 96–112)
Creat: 0.8 mg/dL (ref 0.50–1.10)
GFR, Est African American: >89 mL/min
GFR, Est Non African American: 86 mL/min
Glucose, Bld: 94 mg/dL (ref 70–99)
Potassium: 3.9 mEq/L (ref 3.5–5.3)
Sodium: 139 mEq/L (ref 135–145)
Total Bilirubin: 0.4 mg/dL (ref 0.3–1.2)
Total Protein: 6.6 g/dL (ref 6.0–8.3)

## 2013-06-30 LAB — HEMOGLOBIN A1C
Hgb A1c MFr Bld: 5.7 % — ABNORMAL HIGH (ref <5.7)
Mean Plasma Glucose: 117 mg/dL — ABNORMAL HIGH (ref <117)

## 2013-06-30 MED ORDER — LOSARTAN POTASSIUM 50 MG PO TABS: 50.0000 mg | ORAL_TABLET | Freq: Every day | ORAL | Status: DC

## 2013-06-30 NOTE — Progress Notes (Signed)
Subjective:    Patient ID: Emily Melton, female    DOB: Oct 06, 1960, 52 y.o.   MRN: [id number]  HPI This 52 y.o. female presents for two month follow-up:   1. HTN: two month follow-up; added Losartan 53m daily; no side effects; blood pressures improved to 118-140/70-80.    2.  Hyperlipidemia: cholesterol remained elevated at last visit; very reluctant to start statin due to chronic myalgias, arthralgias.  Has been eating two eggs daily since been out of work.  3.  DDD cervical:  Spine doctor states that R arm pain due to neck issues.  Distal R arm pain onset after moving car seat.  S/p MRI elbow due to pain in elbow.  No issues with elbow or ulnar nerve.  R bicep gets inflamed.  R hand is an issue; holding things is an issue.  Previous evaluation by Dr. BRush Farmer  S/p evaluation by Dr. SDaylene Katayamaat hand specialist.  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  [facility].  S/p fusion in neck; six months later R shoulder surgery; six months later neck surgery.  Nothing has improved.  Work is recommending patient to resign.  Started with Neurontin but blood pressure elevated and mental issues; tried Lyrica but suffered with eye twitching, blood pressure elevation.  Tried sister of Lyrica at the pain clinic; also gave compound topical which also elevated blood pressure; gave Butran patch which caused palpitations, elevated blood pressure.  Discussed spine stimulator but afraid of complications.  Now only taking Tizanidine.  Does not want to be on Vicodin or Percocet.  Last prescribed Dilaudid but not taking it; prescribed in 2013.  Will only take muscle relaxer if gets a headache.  Sleep is ridiculous.  Tried Cymbalta and Savella and did not tolerate them; also tried Wellbutrin again as well; did not tolerate it either.  Has tried Tramadol.   Has tried accupuncture with no relief.  Has appointment this  week for [employer] to see therapist.  4.  Abdominal pain RUQ; recommended repeat abdominal u/s to follow-up at visit 08/2012; never received call about abdominal u/s results in 08/2012; enlarged bile duct on u/s; recommended MRCP which showed several stones; recommend ERCP for stone retraction; s/p ERCP and no stones identified; must have passed stones after MRCP; bile duct was moderately dilated during ERCP by Dr. OCandace Cruise  Labs look normal.  Dr. OCandace Cruisenot concerned about persistent bile duct; recommended repeat abdominal u/s in three months.    5.  Hyperglycemia: sugar 101 at visit two months ago.   Review of Systems  Constitutional: Negative for fever, chills, diaphoresis and fatigue.  Respiratory: Negative for cough, shortness of breath and stridor.   Cardiovascular: Negative for chest pain, palpitations and leg swelling.  Gastrointestinal: Positive for abdominal pain.  Endocrine: Negative for polydipsia, polyphagia and polyuria.  Musculoskeletal: Positive for arthralgias, back pain, gait problem, joint swelling, myalgias, neck pain and neck stiffness.  Skin: Negative for wound.  Neurological: Negative for dizziness, tremors, seizures, syncope, facial asymmetry, speech difficulty, weakness, light-headedness, numbness and headaches.  Psychiatric/Behavioral: Positive for dysphoric mood. The patient is nervous/anxious.        Objective:   Physical Exam  Constitutional: She is oriented to person, place, and time. She appears well-developed and well-nourished. No distress.  HENT:  Head: Normocephalic and atraumatic.  Right Ear: External ear normal.  Left Ear: External ear normal.  Nose: Nose  normal.  Mouth/Throat: Oropharynx is clear and moist.  Eyes: Conjunctivae and EOM are normal. Pupils are equal, round, and reactive to light.  Neck: Normal range of motion. Neck supple. Carotid bruit is not present. No thyromegaly present.  Cardiovascular: Normal rate, regular rhythm, normal heart sounds  and intact distal pulses.  Exam reveals no gallop and no friction rub.   No murmur heard. Pulmonary/Chest: Effort normal and breath sounds normal. She has no wheezes. She has no rales.  Abdominal: Soft. Bowel sounds are normal. She exhibits no distension and no mass. There is no tenderness. There is no rebound and no guarding.  Lymphadenopathy:    She has no cervical adenopathy.  Neurological: She is alert and oriented to person, place, and time. No cranial nerve deficit.  Skin: Skin is warm and dry. No rash noted. She is not diaphoretic. No erythema. No pallor.  Psychiatric: She has a normal mood and affect. Her behavior is normal.       Assessment & Plan:  Essential hypertension, benign - Plan: CBC with Differential, COMPLETE METABOLIC PANEL WITH GFR  Pure hypercholesterolemia - Plan: CANCELED: Lipid panel  Other abnormal glucose - Plan: Hemoglobin A1c  DDD (degenerative disc disease), cervical  1.  HTN: moderately controlled; increase Losartan to 65m daily; obtan labs. 2.  Hypercholesterolemia: uncontrolled; hesitant to start statin.  Recommend weight loss, exercise, low-fat and low-cholesterol food choices. 3. Glucose intolerance: New. REcommend weight loss, exercise, low-carbohydrate food choices. 4. DDD cervical: uncontrolled; undergoing evaluation by ortho and NS; suffers with daily pain.   Meds ordered this encounter  Medications  . DISCONTD: losartan (COZAAR) 50 MG tablet    Sig: Take 1 tablet (50 mg total) by mouth daily.    Dispense:  30 tablet    Refill:  5   KReginia Forts M.D.  Urgent [place]arris[place]12[address] CourtGArgo [place]  [phone]

## 2013-07-03 ENCOUNTER — Other Ambulatory Visit: Payer: Self-pay | Admitting: Family Medicine

## 2013-07-13 ENCOUNTER — Encounter: Payer: Self-pay | Admitting: Family Medicine

## 2013-07-15 ENCOUNTER — Telehealth: Payer: Self-pay

## 2013-07-15 NOTE — Telephone Encounter (Signed)
PT WOULD LIKE TO SPEAK WITH SOMEONE REGARDING HER MEDICATION. [place] [phone]

## 2013-07-15 NOTE — Telephone Encounter (Signed)
Called her. She states the Cozaar was increased to 50 mg, since then she has had increased swelling since with her ankles. She states her BP readings are better

## 2013-07-18 NOTE — Telephone Encounter (Signed)
Call --- leg swelling is a rare and unlikely side effect to Cozaar.  I would continue Cozaar 45m daily.  Swelling may be due to extra salt in diet during the holiday season; let's continue Cozaar [id number] next visit.  Please have patient call me if swelling worsens or becomes moderate to severe or if she develops shortness of breath.

## 2013-07-18 NOTE — Telephone Encounter (Signed)
Called patient, advised she will continue as directed.

## 2013-08-14 ENCOUNTER — Ambulatory Visit: Payer: Self-pay | Admitting: Family Medicine

## 2013-08-14 LAB — HM MAMMOGRAPHY: HM Mammogram: NEGATIVE

## 2013-08-21 ENCOUNTER — Telehealth: Payer: Self-pay

## 2013-08-21 NOTE — Telephone Encounter (Signed)
[employer] form in Dr. Thompson Caul box. We also need to attach requested records before sending.

## 2013-09-15 ENCOUNTER — Ambulatory Visit: Payer: Self-pay | Admitting: Gastroenterology

## 2013-09-18 DIAGNOSIS — Z0271 Encounter for disability determination: Secondary | ICD-10-CM

## 2013-09-22 ENCOUNTER — Telehealth: Payer: Self-pay | Admitting: Radiology

## 2013-09-22 NOTE — Telephone Encounter (Signed)
Phone call to patient, her neurosurgeon or her orthopedist should advise on her work status for disability determination. She states information has been sent to neurosurgeon as well.

## 2013-09-29 ENCOUNTER — Encounter: Payer: Self-pay | Admitting: Family Medicine

## 2013-09-29 ENCOUNTER — Ambulatory Visit (INDEPENDENT_AMBULATORY_CARE_PROVIDER_SITE_OTHER): Payer: 59 | Admitting: Family Medicine

## 2013-09-29 VITALS — BP 146/92 | HR 70 | Temp 97.9°F | Resp 16 | Ht 62.0 in | Wt 192.2 lb

## 2013-09-29 DIAGNOSIS — R102 Pelvic and perineal pain unspecified side: Secondary | ICD-10-CM

## 2013-09-29 DIAGNOSIS — N949 Unspecified condition associated with female genital organs and menstrual cycle: Secondary | ICD-10-CM

## 2013-09-29 DIAGNOSIS — E7800 Pure hypercholesterolemia, unspecified: Secondary | ICD-10-CM

## 2013-09-29 DIAGNOSIS — I10 Essential (primary) hypertension: Secondary | ICD-10-CM

## 2013-09-29 DIAGNOSIS — G4710 Hypersomnia, unspecified: Secondary | ICD-10-CM

## 2013-09-29 DIAGNOSIS — N952 Postmenopausal atrophic vaginitis: Secondary | ICD-10-CM

## 2013-09-29 LAB — CBC WITH DIFFERENTIAL/PLATELET
Basophils Absolute: 0.1 10*3/uL (ref 0.0–0.1)
Basophils Relative: 1 % (ref 0–1)
Eosinophils Absolute: 0.1 10*3/uL (ref 0.0–0.7)
Eosinophils Relative: 2 % (ref 0–5)
HCT: 39.9 % (ref 36.0–46.0)
Hemoglobin: 13.4 g/dL (ref 12.0–15.0)
Lymphocytes Relative: 34 % (ref 12–46)
Lymphs Abs: 2 10*3/uL (ref 0.7–4.0)
MCH: 30.1 pg (ref 26.0–34.0)
MCHC: 33.6 g/dL (ref 30.0–36.0)
MCV: 89.7 fL (ref 78.0–100.0)
Monocytes Absolute: 0.5 10*3/uL (ref 0.1–1.0)
Monocytes Relative: 8 % (ref 3–12)
Neutro Abs: 3.2 10*3/uL (ref 1.7–7.7)
Neutrophils Relative %: 55 % (ref 43–77)
Platelets: 167 10*3/uL (ref 150–400)
RBC: 4.45 MIL/uL (ref 3.87–5.11)
RDW: 13.5 % (ref 11.5–15.5)
WBC: 5.8 10*3/uL (ref 4.0–10.5)

## 2013-09-29 LAB — COMPLETE METABOLIC PANEL WITH GFR
ALT: 18 U/L (ref 0–35)
AST: 22 U/L (ref 0–37)
Albumin: 4.3 g/dL (ref 3.5–5.2)
Alkaline Phosphatase: 68 U/L (ref 39–117)
BUN: 13 mg/dL (ref 6–23)
CO2: 28 mEq/L (ref 19–32)
Calcium: 9.6 mg/dL (ref 8.4–10.5)
Chloride: 106 mEq/L (ref 96–112)
Creat: 0.56 mg/dL (ref 0.50–1.10)
GFR, Est African American: >89 mL/min
GFR, Est Non African American: >89 mL/min
Glucose, Bld: 105 mg/dL — ABNORMAL HIGH (ref 70–99)
Potassium: 3.9 mEq/L (ref 3.5–5.3)
Sodium: 141 mEq/L (ref 135–145)
Total Bilirubin: 0.5 mg/dL (ref 0.2–1.2)
Total Protein: 6.5 g/dL (ref 6.0–8.3)

## 2013-09-29 LAB — LIPID PANEL
Cholesterol: 192 mg/dL (ref 0–200)
HDL: 45 mg/dL (ref >39)
LDL Cholesterol: 126 mg/dL — ABNORMAL HIGH (ref 0–99)
Total CHOL/HDL Ratio: 4.3 Ratio
Triglycerides: 106 mg/dL (ref <150)
VLDL: 21 mg/dL (ref 0–40)

## 2013-09-29 MED ORDER — PROPRANOLOL HCL 60 MG PO TABS: 60.0000 mg | ORAL_TABLET | Freq: Two times a day (BID) | ORAL | Status: DC

## 2013-09-29 MED ORDER — LOSARTAN POTASSIUM 100 MG PO TABS: 100.0000 mg | ORAL_TABLET | Freq: Every day | ORAL | Status: DC

## 2013-09-29 MED ORDER — AMLODIPINE BESYLATE 5 MG PO TABS: 5.0000 mg | ORAL_TABLET | Freq: Every day | ORAL | Status: DC

## 2013-09-29 NOTE — Patient Instructions (Signed)
1. Stop Hydrochlorathiazide. 2.  Call Neurology for appointment about narcolepsy. 3.  Continue to check blood pressure daily. 4.  Return in three months for complete physical.

## 2013-09-29 NOTE — Progress Notes (Signed)
Subjective:   Patient ID: Emily Melton, female    DOB: 1960/12/15, 53 y.o.   MRN: [id number]  HPI  This chart was scribed for [facility]. Tamala Julian, MD, by Sydell Axon, ED Scribe.  HPI Comments: Emily Melton is a 53 y.o. Female, with a history of chronic pain, diffusely, who presents to the [facility] for a  HTN follow-up from 06/30/13, where Losartan 20m was increased to 558min order to improve BP to 118-140/70-80. Cholesterol remained elevated and was reluctant to start medicine because of chronic muscle and joint pain. On 07/15/2013 patient telephoned [facility] because she was concerned about leg swelling and thought it was due to the Losartan; no changes to therapy made at that time.  Last CPE 10/2012.   1. HTN - [facility], with a home reading range of 118-138/70-79. She confirms taking all of her medications as prescribed, with the exception of hydrochlorothiazide, which has caused her to have increased urinary frequency. She has therefore reduced the amount of hydrochlorothiazide to 2-4 times per week. Additionally, patient reports eliminating sodas from her diet and reducing the amount of sugar she eats, which she attributes to her planned weight loss.  She is tolerating Losartan 5071maily without side effects; leg swelling resolved in December.    2. Swelling Follow-Up - Patient states her legs swelling, bilaterally that she noticed on 07/15/2013 resolved shortly afterwards and she said she began to increase her walking to 25-30 minutes; however, she began to have low back pain, L ankle pain, and R upper leg/groin pain, which has since prevented her from exercising regularly. She characterizes her L ankle pain as a tearing/burning pain, and her R upper leg and groin pain as sharp that has been constant since onset.  She follows up with ortho/[facility] this week.   3 Arthralgias - Patient reports with arthralgias to the right side of  her body with onset two days ago. She states that on Saturday, she awoke with severe pain diffusely along the right side of her body. She characterizes the pain as a severe ache, "as if she just awoke from surgery." During severe episodes of pain, patient reports taking Dilaudid, with minimal relief. She states that on 02/19/2013 she was taken out of work by Dr. BroSharol Harnesse to chronic, non changing neck pain and resigned on 07/16/2013. At that time, she was on long-term disability. Patient states she recently began social security disability as well; however, has had trouble with this process. She states that she has been required to not find employment during this time due to the disability process.  4. Dyspareunia - Additionally, patient reports that after engaging in intercourse last night with her husband (after 1 year of no sexual activity), she has experienced some vaginal pain. She denies any discharge or bleeding, or abdominal pain.  5. Narcolepsy - patient has previously been diagnosed with narcolepsy; her previous neurologist no longer has her sleep study for evaluation.  Pt is presenting with an rx from 2001 for Provigil.  Pt was non-compliant with referral to neurology in the past six months due to ongoing neck and shoulder pain. She plans to reschedule appointment with neurology for confirmation of narcolepsy.    Past Medical History  Diagnosis Date  . Depression   . Narcolepsy     w/o cataplexy  . GERD (gastroesophageal reflux disease)   . HTN (hypertension)   . 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 Chicken pox     childhood  . Migraine, unspecified, without mention of intractable migraine without mention of status migrainosus   . Periodic limb movement disorder   . Nonspecific abnormal electrocardiogram (ECG) (EKG)   . Other and unspecified disc disorder of cervical region   . Diaphragmatic hernia without mention of  obstruction or gangrene   . Tobacco use disorder   . Barrett's esophagus   . Personal history of colonic polyps   . Allergic rhinitis, cause unspecified   . Insomnia, unspecified   . Unspecified vitamin D deficiency   . Other abnormal glucose   . Pre-operative cardiovascular examination   . Anemia     Past Surgical History  Procedure Laterality Date  . Cesarean section      x2  1984 and 1986  . Pilonidal cyst resection  1992  . Cholecystectomy    . Carpal tunnel release - bilateral    . Dilation and curettage of uterus      SAB  . Tubel ligation  2002  . Breast biopsy  2005     R   Benign  . Fusion of cervical spine  2007    C5,C6,C7, and T1  . Laprascopy  2009    for RLQ pain:R oophorectomy performed no improvement in pain.   . Vaginal hysterectomy  2009    fibroid/endometriosis, one L ovary remaining  . R shoulder surgery  06/24/09    2 tears in rotator cuff,bone spurs. Dr. Modena Morrow  . 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 Spine surgery  09/2011    Cervical spine fusion  . Back surgery    . Tubal ligation    . Abdominal hysterectomy      fibroids.  ovarian resection R.  . Colonoscopy  03/17/2012    [facility]  . Esophagogastroduodenoscopy  03/17/2012    no Barrett's esophagus. [facility].    Family History  Problem Relation Age of Onset  . Bipolar disorder Mother   . Cancer Mother     bladder  . Hypertension Mother   . Colon polyps Mother   . Osteoarthritis Mother   . Diabetes Mother   . Thyroid disease Mother   . Heart disease Father     angina ?possible AMI in past  . Hyperlipidemia Father   . Cancer Father     skin  . Angina Father   . Asthma Daughter   . Cancer Maternal Grandmother     colon  . Cancer Maternal Grandfather     lung  . Parkinson's disease Maternal Grandfather   . Stroke Maternal Grandfather   . Rheum arthritis Paternal Grandmother   . Heart attack Paternal Grandfather     History   Social History  . Marital Status:  Married    Spouse Name: N/A    Number of Children: 2  . Years of Education: N/A   Occupational History  . Shipping Claims Analyst    Social History Main Topics  . Smoking status: Current Every Day Smoker  . Smokeless tobacco: Not on file     Comment: smoked 1 ppd 30-35 years; quit in 2011  . Alcohol Use: Yes  . Drug Use: No  . Sexual Activity: Yes    Birth Control/ Protection: Surgical   Other Topics Concern  . Not on file   Social History Narrative   Patient exercises  3-4 times per week (walking x 20 minutes 3 days a week).    Marital status:  Married x 10 years; happily married; second marriage; no abuse.      Children:  2 children(30, 28); three grandsons.      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; call center as of 2014.        Tobacco:  1/2 ppd x 34 years.       Alcohol: rare       Drugs: none      Exercise:  Rare       lives in [place].           Allergies  Allergen Reactions  . St Johns Wort Anaphylaxis  . Bupropion Hcl   . Celebrex [Celecoxib]   . Lyrica [Pregabalin]   . Methylphenidate Hcl   . Modafinil   . Rabeprazole Sodium     Patient Active Problem List   Diagnosis Date Noted  . Pure hypercholesterolemia 06/30/2013  . Other abnormal glucose 06/30/2013  . DDD (degenerative disc disease), cervical 06/30/2013  . Routine general medical examination at a health care facility 11/17/2012  . Essential hypertension, benign 11/17/2012  . Sinusitis, acute, maxillary 11/17/2012  . Carbuncles 11/17/2012  . Hypersomnolence 11/17/2012  . ELECTROCARDIOGRAM, ABNORMAL 09/22/2010      Current Outpatient Prescriptions on File Prior to Visit  Medication Sig Dispense Refill  . amLODipine (NORVASC) 5 MG tablet TAKE 1 TABLET DAILY  30 tablet  5  . B Complex Vitamins (B COMPLEX 50 PO) Take 50 mg by mouth daily.      . cetirizine (ZYRTEC) 10 MG tablet Take 10 mg by mouth daily.        . Cholecalciferol (VITAMIN D3) 2000 UNITS TABS Take 1  tablet by mouth daily.       Marland Kitchen dexlansoprazole (DEXILANT) 60 MG capsule Take 60 mg by mouth daily.        . hyoscyamine (ANASPAZ) 0.125 MG TBDP Place 0.125-0.25 mg under the tongue every 6 (six) hours as needed. For stomach cramps      . losartan (COZAAR) 50 MG tablet Take 1 tablet (50 mg total) by mouth daily.  30 tablet  5  . montelukast (SINGULAIR) 10 MG tablet Take 1 tablet (10 mg total) by mouth at bedtime.  90 tablet  2  . Multiple Vitamin (MULTIVITAMIN) tablet Take 1 tablet by mouth daily.      . Omega-3 Fatty Acids (FISH OIL PO) Take 1,200 mg by mouth daily.      . propranolol (INDERAL) 60 MG tablet TAKE 1 TABLET TWICE A DAY FOR BLOOD PRESSURE  60 tablet  11  . psyllium (HYDROCIL/METAMUCIL) 95 % PACK Take 1 packet by mouth daily.      Marland Kitchen tiZANidine (ZANAFLEX) 4 MG tablet Take 4 mg by mouth at bedtime as needed. For muscle spasms      . hydrochlorothiazide (HYDRODIURIL) 25 MG tablet TAKE 1 TABLET EVERY DAY  30 tablet  5  . ketoconazole (NIZORAL) 2 % cream Apply topically daily.  60 g  0   No current facility-administered medications on file prior to visit.    Triage Vitals: BP 146/92  Pulse 70  Temp(Src) 97.9 F (36.6 C) (Oral)  Resp 16  Ht 5' 2"  (1.575 m)  Wt 192 lb 3.2 oz (87.181 kg)  BMI 35.14 kg/m2  SpO2 93%  Review of Systems  Constitutional: Negative for fever and chills.  Respiratory: Negative for choking  and shortness of breath.   Cardiovascular: Negative for chest pain.  Gastrointestinal: Negative for nausea, vomiting, abdominal pain and diarrhea.  Genitourinary: Positive for frequency, vaginal pain and dyspareunia. Negative for vaginal bleeding and vaginal discharge.  Musculoskeletal: Positive for arthralgias, back pain and neck pain. Negative for joint swelling and myalgias.  Neurological: Negative for dizziness, weakness, numbness and headaches.  All other systems reviewed and are negative.   Objective:  Physical Exam  Nursing note and vitals  reviewed. Constitutional: She is oriented to person, place, and time. She appears well-developed and well-nourished. No distress.  HENT:  Head: Normocephalic and atraumatic.  Mouth/Throat: Oropharynx is clear and moist.  Eyes: Conjunctivae and EOM are normal. Pupils are equal, round, and reactive to light.  Neck: Neck supple. No tracheal deviation present. No thyromegaly present.  Cardiovascular: Normal rate, regular rhythm, normal heart sounds and intact distal pulses.  Exam reveals no gallop and no friction rub.   No murmur heard. Pulmonary/Chest: Effort normal and breath sounds normal. No respiratory distress. She has no wheezes. She has no rales.  Abdominal: Soft. Bowel sounds are normal. She exhibits no distension and no mass. There is tenderness in the right upper quadrant. There is no rebound and no guarding.  Genitourinary: There is no rash, tenderness, lesion or injury on the right labia. There is no rash, tenderness, lesion or injury on the left labia. Right adnexum displays no mass, no tenderness and no fullness. Left adnexum displays no mass, no tenderness and no fullness. There is tenderness (Anterior aspect of vaginal wall without abrasion) around the vagina. No erythema or bleeding around the vagina. No foreign body around the vagina. No signs of injury around the vagina. No vaginal discharge found.  Musculoskeletal: Normal range of motion. She exhibits no edema and no tenderness.  Lymphadenopathy:    She has no cervical adenopathy.  Neurological: She is alert and oriented to person, place, and time. No cranial nerve deficit. Coordination normal.  Skin: Skin is warm and dry. She is not diaphoretic.  Psychiatric: She has a normal mood and affect. Her behavior is normal.     Assessment & Plan:  Essential hypertension, benign - Plan: CBC with Differential, COMPLETE METABOLIC PANEL WITH GFR  Pure hypercholesterolemia - Plan: Lipid panel  Vaginal pain  Atrophic  vaginitis  Hypersomnolence  1. HTN: controlled; will d/c HCTZ due to side effect of urinary frequency; increase Losartan to 175m daily; refill of Amlodipine 570mdaily provided; refill of Propanolol provided.  Obtain labs. 2. Hyperlipidemia: uncontrolled; s/p six month trial of exercise, dietary modification; obtain labs.  Pt reluctant to start statin due to chronic arthralgias. 3.  Hypersomnolence/narcolepsy hx: persistent; pt to contact neurology for evaluation; previous referral placed.  Did not provide rx for Provigil. 4.  Vaginal pain: New. Traumatic following intercourse; benign exam; recommend vaginal rest. 5.  Atrophic Vaginitis: stable; no mucosal abnormality on exam.   6. DDD cervical spine: persistent; followed by ortho closely and undergoing evaluation disability; has resigned from work.  Advised that long-term disability paperwork to be completed by ortho who has performed evaluation and is aware of physical limitations.  She is maintained on Zanaflex and Dilaudid.    Meds ordered this encounter  Medications  . amLODipine (NORVASC) 5 MG tablet    Sig: Take 1 tablet (5 mg total) by mouth daily.    Dispense:  30 tablet    Refill:  5  . losartan (COZAAR) 100 MG tablet    Sig: Take 1  tablet (100 mg total) by mouth daily.    Dispense:  30 tablet    Refill:  5  . propranolol (INDERAL) 60 MG tablet    Sig: Take 1 tablet (60 mg total) by mouth 2 (two) times daily.    Dispense:  60 tablet    Refill:  5    I personally performed the services described in this documentation, which was scribed in my presence. The recorded information has been reviewed and is accurate.  Reginia Forts, M.D.  [facility] [address] [place], [place]  [address] [phone] phone [phone] fax

## 2013-10-03 ENCOUNTER — Encounter: Payer: Self-pay | Admitting: Family Medicine

## 2013-10-17 ENCOUNTER — Other Ambulatory Visit: Payer: Self-pay | Admitting: Family Medicine

## 2013-11-26 ENCOUNTER — Other Ambulatory Visit: Payer: Self-pay | Admitting: Family Medicine

## 2013-11-28 ENCOUNTER — Emergency Department (INDEPENDENT_AMBULATORY_CARE_PROVIDER_SITE_OTHER): Payer: 59

## 2013-11-28 ENCOUNTER — Emergency Department (HOSPITAL_COMMUNITY)
Admission: EM | Admit: 2013-11-28 | Discharge: 2013-11-28 | Disposition: A | Discharge status: Home / Self Care | Payer: 59 | Source: Home / Self Care | Attending: Family Medicine | Admitting: Family Medicine

## 2013-11-28 ENCOUNTER — Encounter (HOSPITAL_COMMUNITY): Payer: Self-pay | Admitting: Emergency Medicine

## 2013-11-28 DIAGNOSIS — J189 Pneumonia, unspecified organism: Secondary | ICD-10-CM

## 2013-11-28 MED ORDER — CEFTRIAXONE SODIUM 1 G IJ SOLR
INTRAMUSCULAR | Status: AC
  Filled 2013-11-28: qty 10

## 2013-11-28 MED ORDER — MOXIFLOXACIN HCL 400 MG PO TABS: 400.0000 mg | ORAL_TABLET | Freq: Every day | ORAL | Status: DC

## 2013-11-28 MED ORDER — LIDOCAINE HCL (PF) 1 % IJ SOLN
INTRAMUSCULAR | Status: AC
  Filled 2013-11-28: qty 5

## 2013-11-28 MED ORDER — CEFTRIAXONE SODIUM 1 G IJ SOLR
1.0000 g | Freq: Once | INTRAMUSCULAR | Status: AC
  Administered 2013-11-28: 1 g via INTRAMUSCULAR

## 2013-11-28 NOTE — ED Notes (Signed)
C/o cough and fever States sx started Tuesday night States she has a cough, sneezing, fever, body aches, sob and wheezing,  OTC medication taking as tx

## 2013-11-28 NOTE — ED Provider Notes (Signed)
CSN: [id number]     Arrival date & time 11/28/13  1836 History   First MD Initiated Contact with Patient 11/28/13 1942     Chief Complaint  Patient presents with  . Cough  . Fever   (Consider location/radiation/quality/duration/timing/severity/associated sxs/prior Treatment) Patient is a 53 y.o. female presenting with cough. The history is provided by the patient.  Cough Cough characteristics:  Productive Sputum characteristics:  Yellow Severity:  Moderate Onset quality:  Gradual Duration:  1 week Progression:  Worsening Chronicity:  New Smoker: yes   Context: upper respiratory infection   Associated symptoms: chills, fever, shortness of breath and wheezing     Past Medical History  Diagnosis Date  . Depression   . Narcolepsy     w/o cataplexy  . GERD (gastroesophageal reflux disease)   . HTN (hypertension)   . 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 Chicken pox     childhood  . Migraine, unspecified, without mention of intractable migraine without mention of status migrainosus   . Periodic limb movement disorder   . Nonspecific abnormal electrocardiogram (ECG) (EKG)   . Other and unspecified disc disorder of cervical region   . Diaphragmatic hernia without mention of obstruction or gangrene   . Tobacco use disorder   . Barrett's esophagus   . Personal history of colonic polyps   . Allergic rhinitis, cause unspecified   . Insomnia, unspecified   . Unspecified vitamin D deficiency   . Other abnormal glucose   . Pre-operative cardiovascular examination   . Anemia    Past Surgical History  Procedure Laterality Date  . Cesarean section      x2  1984 and 1986  . Pilonidal cyst resection  1992  . Cholecystectomy    . Carpal tunnel release - bilateral    . Dilation and curettage of uterus      SAB  . Tubel ligation  2002  . Breast biopsy  2005     R   Benign  . Fusion of cervical spine  2007    C5,C6,C7, and T1   . Laprascopy  2009    for RLQ pain:R oophorectomy performed no improvement in pain.   . Vaginal hysterectomy  2009    fibroid/endometriosis, one L ovary remaining  . R shoulder surgery  06/24/09    2 tears in rotator cuff,bone spurs. Dr. Modena Morrow  . 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 Spine surgery  09/2011    Cervical spine fusion  . Back surgery    . Tubal ligation    . Abdominal hysterectomy      fibroids.  ovarian resection R.  . Colonoscopy  03/17/2012    [facility]  . Esophagogastroduodenoscopy  03/17/2012    no Barrett's esophagus. [facility].   Family History  Problem Relation Age of Onset  . Bipolar disorder Mother   . Cancer Mother     bladder  . Hypertension Mother   . Colon polyps Mother   . Osteoarthritis Mother   . Diabetes Mother   . Thyroid disease Mother   . Heart disease Father     angina ?possible AMI in past  . Hyperlipidemia Father   . Cancer Father     skin  . Angina Father   . Asthma Daughter   . Cancer Maternal Grandmother     colon  . Cancer Maternal Grandfather  lung  . Parkinson's disease Maternal Grandfather   . Stroke Maternal Grandfather   . Rheum arthritis Paternal Grandmother   . Heart attack Paternal Grandfather    History  Substance Use Topics  . Smoking status: Current Every Day Smoker  . Smokeless tobacco: Not on file     Comment: smoked 1 ppd 30-35 years; quit in 2011  . Alcohol Use: Yes   OB History   Grav Para Term Preterm Abortions TAB SAB Ect Mult Living                 Review of Systems  Constitutional: Positive for fever and chills.  HENT: Positive for congestion and postnasal drip.   Respiratory: Positive for cough, shortness of breath and wheezing.   Cardiovascular: Negative.   Gastrointestinal: Negative.     Allergies  St johns wort; Bupropion hcl; Celebrex; Lyrica; Methylphenidate hcl; Modafinil; and Rabeprazole sodium  Home Medications   Prior to Admission medications   Medication Sig  Start Date End Date Taking? Authorizing Provider  amLODipine (NORVASC) 5 MG tablet Take 1 tablet (5 mg total) by mouth daily. 09/29/13   Wardell Honour, MD  B Complex Vitamins (B COMPLEX 50 PO) Take 50 mg by mouth daily.    Historical Provider, MD  cetirizine (ZYRTEC) 10 MG tablet Take 10 mg by mouth daily.      Historical Provider, MD  Cholecalciferol (VITAMIN D3) 2000 UNITS TABS Take 1 tablet by mouth daily.     Historical Provider, MD  dexlansoprazole (DEXILANT) 60 MG capsule Take 60 mg by mouth daily.      Historical Provider, MD  hydrochlorothiazide (HYDRODIURIL) 25 MG tablet TAKE 1 TABLET EVERY DAY 05/06/13   Wardell Honour, MD  hyoscyamine (ANASPAZ) 0.125 MG TBDP Place 0.125-0.25 mg under the tongue every 6 (six) hours as needed. For stomach cramps    Historical Provider, MD  ketoconazole (NIZORAL) 2 % cream Apply topically daily. 04/30/13   Wardell Honour, MD  losartan (COZAAR) 100 MG tablet Take 1 tablet (100 mg total) by mouth daily. 09/29/13   Wardell Honour, MD  montelukast (SINGULAIR) 10 MG tablet TAKE 1 TABLET BY MOUTH AT BEDTIME 10/17/13   Wardell Honour, MD  Multiple Vitamin (MULTIVITAMIN) tablet Take 1 tablet by mouth daily.    Historical Provider, MD  Omega-3 Fatty Acids (FISH OIL PO) Take 1,200 mg by mouth daily.    Historical Provider, MD  propranolol (INDERAL) 60 MG tablet Take 1 tablet (60 mg total) by mouth 2 (two) times daily. 09/29/13   Wardell Honour, MD  psyllium (HYDROCIL/METAMUCIL) 95 % PACK Take 1 packet by mouth daily.    Historical Provider, MD  tiZANidine (ZANAFLEX) 4 MG tablet Take 4 mg by mouth at bedtime as needed. For muscle spasms    Historical Provider, MD   BP 111/69  Pulse 100  Temp(Src) 99.6 F (37.6 C) (Oral)  Resp 14  SpO2 90% Physical Exam  Nursing note and vitals reviewed. Constitutional: She is oriented to person, place, and time. She appears well-developed and well-nourished.  HENT:  Right Ear: External ear normal.  Left Ear: External ear normal.   Mouth/Throat: Oropharynx is clear and moist.  Eyes: Conjunctivae are normal. Pupils are equal, round, and reactive to light.  Neck: Normal range of motion. Neck supple.  Cardiovascular: Regular rhythm and normal heart sounds.   Pulmonary/Chest: Effort normal. She has wheezes. She has rhonchi.  Abdominal: Soft. Bowel sounds are normal.  Lymphadenopathy:  She has no cervical adenopathy.  Neurological: She is alert and oriented to person, place, and time.  Skin: Skin is warm and dry.    ED Course  Procedures (including critical care time) Labs Review Labs Reviewed - No data to display  Imaging Review Dg Chest 2 View  11/28/2013   CLINICAL DATA:  3 day history of cough with fever and wheezing  EXAM: CHEST  2 VIEW  COMPARISON:  PA and lateral chest x-ray of February 10, 2006  FINDINGS: The lungs are well-expanded. There is no focal infiltrate. There are coarse lung markings in the retrocardiac region on the left which are more conspicuous than in the past. The cardiac silhouette is normal in size. The pulmonary vascularity is not engorged. The mediastinum is normal in width. There is no pleural effusion or pneumothorax. The observed portions of the bony thorax exhibit no acute abnormalities. The patient has undergone previous lower cervical and upper thoracic fusion.  IMPRESSION: There are coarse lung markings in the retrocardiac region on the left which may reflect atelectasis or developing pneumonia. There is no evidence of CHF. Followup films following therapy are recommended to assure clearing.   Electronically Signed   By: David  Martinique   On: 11/28/2013 20:19     MDM   1. CAP (community acquired pneumonia)        Billy Fischer, MD 11/28/13 2030

## 2013-11-28 NOTE — Discharge Instructions (Signed)
Drink plenty of fluids as discussed, use medicine as prescribed, and mucinex or delsym for cough. No more smoking.  Return or see your doctor if further problems

## 2013-12-02 ENCOUNTER — Telehealth: Payer: Self-pay

## 2013-12-02 NOTE — Telephone Encounter (Signed)
Patient has pneumonia and has questions.  Fever 99.4 - 99.9  Was up last night 102.   Went to [facility] on Friday.  BP is low.   Stopped smoking on Friday   [phone]

## 2013-12-02 NOTE — Telephone Encounter (Signed)
Pt was given Avelox in the ER for PNA. Wants to know when she should f/u with [place]. Advised finishing the abx and then rechecking with [place] to make sure it cleared.

## 2013-12-02 NOTE — Telephone Encounter (Signed)
Call --- is she starting to feel better?  If yes, recommend completing antibiotic therapy. She will need a repeat CXR in upcoming month to confirm that pneumonia resolves.  If she is not starting to feel better, I would like to see her this week.

## 2013-12-02 NOTE — Telephone Encounter (Signed)
Dr. Tamala Julian, Juluis Rainier. Told her I would call her back if you advised for her to come back in sooner.

## 2013-12-05 NOTE — Telephone Encounter (Signed)
Pt.notified

## 2013-12-05 NOTE — Telephone Encounter (Signed)
Left message on machine to call back  

## 2014-01-27 ENCOUNTER — Other Ambulatory Visit: Payer: Self-pay | Admitting: Family Medicine

## 2014-01-28 DIAGNOSIS — Z0271 Encounter for disability determination: Secondary | ICD-10-CM

## 2014-02-03 ENCOUNTER — Encounter: Payer: 59 | Admitting: Family Medicine

## 2014-02-04 ENCOUNTER — Ambulatory Visit (INDEPENDENT_AMBULATORY_CARE_PROVIDER_SITE_OTHER): Payer: 59 | Admitting: Family Medicine

## 2014-02-04 ENCOUNTER — Encounter: Payer: Self-pay | Admitting: Family Medicine

## 2014-02-04 ENCOUNTER — Ambulatory Visit (INDEPENDENT_AMBULATORY_CARE_PROVIDER_SITE_OTHER): Payer: 59

## 2014-02-04 VITALS — BP 126/84 | HR 51 | Temp 97.8°F | Resp 16 | Ht 62.0 in | Wt 194.0 lb

## 2014-02-04 DIAGNOSIS — F329 Major depressive disorder, single episode, unspecified: Secondary | ICD-10-CM

## 2014-02-04 DIAGNOSIS — I10 Essential (primary) hypertension: Secondary | ICD-10-CM

## 2014-02-04 DIAGNOSIS — R7309 Other abnormal glucose: Secondary | ICD-10-CM

## 2014-02-04 DIAGNOSIS — J189 Pneumonia, unspecified organism: Secondary | ICD-10-CM

## 2014-02-04 DIAGNOSIS — Z0000 Encounter for general adult medical examination without abnormal findings: Secondary | ICD-10-CM

## 2014-02-04 DIAGNOSIS — E7800 Pure hypercholesterolemia, unspecified: Secondary | ICD-10-CM

## 2014-02-04 DIAGNOSIS — J301 Allergic rhinitis due to pollen: Secondary | ICD-10-CM

## 2014-02-04 DIAGNOSIS — F3289 Other specified depressive episodes: Secondary | ICD-10-CM

## 2014-02-04 LAB — POCT URINALYSIS DIPSTICK
Bilirubin, UA: NEGATIVE
Glucose, UA: NEGATIVE
Ketones, UA: NEGATIVE
Leukocytes, UA: NEGATIVE
Nitrite, UA: NEGATIVE
Protein, UA: 30
Spec Grav, UA: 1.025
Urobilinogen, UA: 0.2
pH, UA: 5

## 2014-02-04 LAB — CBC
HCT: 36 % (ref 36.0–46.0)
Hemoglobin: 12.5 g/dL (ref 12.0–15.0)
MCH: 30 pg (ref 26.0–34.0)
MCHC: 34.7 g/dL (ref 30.0–36.0)
MCV: 86.5 fL (ref 78.0–100.0)
Platelets: 171 10*3/uL (ref 150–400)
RBC: 4.16 MIL/uL (ref 3.87–5.11)
RDW: 13.8 % (ref 11.5–15.5)
WBC: 5.7 10*3/uL (ref 4.0–10.5)

## 2014-02-04 LAB — LIPID PANEL
Cholesterol: 191 mg/dL (ref 0–200)
HDL: 52 mg/dL (ref >39)
LDL Cholesterol: 120 mg/dL — ABNORMAL HIGH (ref 0–99)
Total CHOL/HDL Ratio: 3.7 Ratio
Triglycerides: 96 mg/dL (ref <150)
VLDL: 19 mg/dL (ref 0–40)

## 2014-02-04 LAB — COMPREHENSIVE METABOLIC PANEL
ALT: 24 U/L (ref 0–35)
AST: 23 U/L (ref 0–37)
Albumin: 4.3 g/dL (ref 3.5–5.2)
Alkaline Phosphatase: 55 U/L (ref 39–117)
BUN: 14 mg/dL (ref 6–23)
CO2: 25 mEq/L (ref 19–32)
Calcium: 9.4 mg/dL (ref 8.4–10.5)
Chloride: 106 mEq/L (ref 96–112)
Creat: 0.66 mg/dL (ref 0.50–1.10)
Glucose, Bld: 101 mg/dL — ABNORMAL HIGH (ref 70–99)
Potassium: 4.1 mEq/L (ref 3.5–5.3)
Sodium: 141 mEq/L (ref 135–145)
Total Bilirubin: 0.6 mg/dL (ref 0.2–1.2)
Total Protein: 6.5 g/dL (ref 6.0–8.3)

## 2014-02-04 LAB — HEMOGLOBIN A1C
Hgb A1c MFr Bld: 5.5 % (ref <5.7)
Mean Plasma Glucose: 111 mg/dL (ref <117)

## 2014-02-04 LAB — TSH: TSH: 0.439 u[IU]/mL (ref 0.350–4.500)

## 2014-02-04 MED ORDER — AMLODIPINE BESYLATE 2.5 MG PO TABS: 2.5000 mg | ORAL_TABLET | Freq: Every day | ORAL | Status: DC

## 2014-02-04 MED ORDER — VENLAFAXINE HCL ER 75 MG PO CP24: 75.0000 mg | ORAL_CAPSULE | Freq: Every day | ORAL | Status: DC

## 2014-02-04 MED ORDER — PROPRANOLOL HCL 60 MG PO TABS: 60.0000 mg | ORAL_TABLET | Freq: Two times a day (BID) | ORAL | Status: DC

## 2014-02-04 MED ORDER — LOSARTAN POTASSIUM 100 MG PO TABS: 100.0000 mg | ORAL_TABLET | Freq: Every day | ORAL | Status: DC

## 2014-02-04 MED ORDER — MONTELUKAST SODIUM 10 MG PO TABS: 10.0000 mg | ORAL_TABLET | Freq: Every day | ORAL | Status: DC

## 2014-02-04 NOTE — Progress Notes (Signed)
   Subjective:    Patient ID: Emily Melton, female    DOB: [birth year], 53 y.o.   MRN: [id number]  HPI    Review of Systems  Constitutional: Positive for diaphoresis.  HENT: Negative.   Eyes: Negative.   Respiratory: Negative.   Cardiovascular: Negative.   Gastrointestinal: Negative.   Endocrine: Negative.   Genitourinary: Negative.   Musculoskeletal: Negative.   Skin: Negative.   Allergic/Immunologic: Negative.   Neurological: Negative.   Hematological: Negative.   Psychiatric/Behavioral: Negative.        Objective:   Physical Exam        Assessment & Plan:
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
## 2014-03-13 ENCOUNTER — Encounter: Payer: Self-pay | Admitting: Radiology

## 2014-03-26 ENCOUNTER — Other Ambulatory Visit: Payer: Self-pay | Admitting: Family Medicine

## 2014-04-29 ENCOUNTER — Other Ambulatory Visit: Payer: Self-pay | Admitting: Radiology

## 2014-04-29 DIAGNOSIS — F330 Major depressive disorder, recurrent, mild: Secondary | ICD-10-CM

## 2014-04-29 DIAGNOSIS — J301 Allergic rhinitis due to pollen: Secondary | ICD-10-CM

## 2014-04-29 DIAGNOSIS — I10 Essential (primary) hypertension: Secondary | ICD-10-CM

## 2014-04-29 MED ORDER — VENLAFAXINE HCL ER 75 MG PO CP24: 75.0000 mg | ORAL_CAPSULE | Freq: Every day | ORAL | Status: DC

## 2014-04-29 MED ORDER — MONTELUKAST SODIUM 10 MG PO TABS: 10.0000 mg | ORAL_TABLET | Freq: Every day | ORAL | Status: DC

## 2014-04-29 MED ORDER — AMLODIPINE BESYLATE 2.5 MG PO TABS: 2.5000 mg | ORAL_TABLET | Freq: Every day | ORAL | Status: DC

## 2014-04-29 MED ORDER — LOSARTAN POTASSIUM 100 MG PO TABS: 100.0000 mg | ORAL_TABLET | Freq: Every day | ORAL | Status: DC

## 2014-04-29 MED ORDER — PROPRANOLOL HCL 60 MG PO TABS
60.0000 mg | ORAL_TABLET | Freq: Two times a day (BID) | ORAL | Status: DC
Start: 2014-04-29 — End: 2014-10-22

## 2014-05-22 ENCOUNTER — Ambulatory Visit (INDEPENDENT_AMBULATORY_CARE_PROVIDER_SITE_OTHER): Payer: 59 | Admitting: Emergency Medicine

## 2014-05-22 VITALS — BP 200/106 | HR 58 | Temp 98.7°F | Resp 18 | Ht 62.0 in | Wt 202.6 lb

## 2014-05-22 DIAGNOSIS — G5602 Carpal tunnel syndrome, left upper limb: Secondary | ICD-10-CM

## 2014-05-22 DIAGNOSIS — I10 Essential (primary) hypertension: Secondary | ICD-10-CM

## 2014-05-22 DIAGNOSIS — Z23 Encounter for immunization: Secondary | ICD-10-CM

## 2014-05-22 DIAGNOSIS — G5601 Carpal tunnel syndrome, right upper limb: Secondary | ICD-10-CM

## 2014-05-22 DIAGNOSIS — G5603 Carpal tunnel syndrome, bilateral upper limbs: Secondary | ICD-10-CM

## 2014-05-22 DIAGNOSIS — F330 Major depressive disorder, recurrent, mild: Secondary | ICD-10-CM

## 2014-05-22 MED ORDER — AMLODIPINE BESYLATE 5 MG PO TABS: 5.0000 mg | ORAL_TABLET | Freq: Every day | ORAL | Status: DC

## 2014-05-22 MED ORDER — VENLAFAXINE HCL ER 37.5 MG PO CP24: ORAL_CAPSULE | ORAL | Status: DC

## 2014-05-22 NOTE — Progress Notes (Signed)
Subjective:    Patient ID: Emily Melton, female    DOB: 06-Feb-1961, 53 y.o.   MRN: [id number] This chart was scribed for [place]. Everlene Farrier, MD by Steva Colder, ED Scribe. The patient was seen in room 8 at 4:30 PM.   Chief Complaint  Patient presents with  . Hand Pain    Both Thumbs pain get worse -NKI- x 3 weeks  . Hypertension  . Flu Vaccine    HPI Emily Melton is a 53 y.o. female with a medical hx of HTN who presents today complaining of bilateral hand pain onset 3 weeks. She is also experiencing HTN. She states that she was given 20 mg of her HTN medications and was informed to take 3 of them by [facility]. She states that she started taking Effexor 75 mg and Dr. Tamala Julian Rx it for her. She states that she is having side effects of vivid dreams, mood swings, and fatigue. She states that she has had side effects to almost every HTN medication.  She states that both her thumbs hurt and the pain is getting worse. She states that the pain on her left side began 4 weeks ago and 3 weeks ago on the right thumb. She states that the pain feels like there is an ice pick sticking in there. She states that she is having associated symptoms of fast heartbeat. She denies CP and any other symptoms.   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 She states that she is now disabled and cannot work because of the surgeries that she has had to get. She states that she quit smoking in May after being dx with pneumonia. She states that she uses an exercise bike for her exercise. She states that she has had surgery 3 x in her neck and 2 x in her shoulder. She states that she has also had bilateral surgery for her carpal tunnel in [place], [place]. She states that he next appointment with Dr. Tamala Julian is 06/08/14.    Left arm blood pressure is 180/90 while in the office. Right arm blood pressure is 180/88 while in the office.  Patient Active Problem List     Diagnosis Date Noted  . Pure hypercholesterolemia 06/30/2013  . Other abnormal glucose 06/30/2013  . DDD (degenerative disc disease), cervical 06/30/2013  . Routine general medical examination at a health care facility 11/17/2012  . Essential hypertension, benign 11/17/2012  . Sinusitis, acute, maxillary 11/17/2012  . Carbuncles 11/17/2012  . Hypersomnolence 11/17/2012  . ELECTROCARDIOGRAM, ABNORMAL 09/22/2010   Past Medical History  Diagnosis Date  . Depression   . Narcolepsy     w/o cataplexy  . GERD (gastroesophageal reflux disease)   . HTN (hypertension)   . 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 Chicken pox     childhood  . Migraine, unspecified, without mention of intractable migraine without mention of status migrainosus   . Periodic limb movement disorder   . Nonspecific abnormal electrocardiogram (ECG) (EKG)   . Other and unspecified disc disorder of cervical region   . Diaphragmatic hernia without mention of obstruction or gangrene   . Tobacco use disorder   . Barrett's esophagus   . Personal history of colonic polyps   . Allergic rhinitis, cause unspecified   . Insomnia, unspecified   . Unspecified vitamin D deficiency   .  Other abnormal glucose   . Pre-operative cardiovascular examination   . Anemia   . Allergy   . Osteoporosis    Past Surgical History  Procedure Laterality Date  . Cesarean section      x2  1984 and 1986  . Pilonidal cyst resection  1992  . Cholecystectomy    . Carpal tunnel release - bilateral    . Dilation and curettage of uterus      SAB  . Tubel ligation  2002  . Breast biopsy  2005     R   Benign  . Fusion of cervical spine  2007    C5,C6,C7, and T1  . Laprascopy  2009    for RLQ pain:R oophorectomy performed no improvement in pain.   . Vaginal hysterectomy  2009    fibroid/endometriosis, one L ovary remaining  . R shoulder surgery  06/24/09    2 tears in rotator cuff,bone  spurs. Dr. Modena Morrow  . 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 Spine surgery  09/2011    Cervical spine fusion  . Back surgery    . Tubal ligation    . Colonoscopy  03/17/2012    [facility]  . Esophagogastroduodenoscopy  03/17/2012    no Barrett's esophagus. [facility].  Marland Kitchen Abdominal hysterectomy      fibroids and endometriosis.  ovarian resection R.   Allergies  Allergen Reactions  . St Johns Wort Anaphylaxis  . Bupropion Hcl   . Celebrex [Celecoxib]   . Lyrica [Pregabalin]   . Methylphenidate Hcl   . Modafinil   . Rabeprazole Sodium    Prior to Admission medications   Medication Sig Start Date End Date Taking? Authorizing Provider  amLODipine (NORVASC) 2.5 MG tablet Take 1 tablet (2.5 mg total) by mouth daily. 04/29/14  Yes Wardell Honour, MD  B Complex Vitamins (B COMPLEX 50 PO) Take 50 mg by mouth daily.   Yes Historical Provider, MD  cetirizine (ZYRTEC) 10 MG tablet Take 10 mg by mouth daily.     Yes Historical Provider, MD  Cholecalciferol (VITAMIN D3) 2000 UNITS TABS Take 1 tablet by mouth daily.    Yes Historical Provider, MD  dexlansoprazole (DEXILANT) 60 MG capsule Take 60 mg by mouth daily.     Yes Historical Provider, MD  hyoscyamine (ANASPAZ) 0.125 MG TBDP Place 0.125-0.25 mg under the tongue every 6 (six) hours as needed. For stomach cramps   Yes Historical Provider, MD  losartan (COZAAR) 100 MG tablet Take 1 tablet (100 mg total) by mouth daily. 04/29/14  Yes Wardell Honour, MD  montelukast (SINGULAIR) 10 MG tablet Take 1 tablet (10 mg total) by mouth at bedtime. 04/29/14  Yes Wardell Honour, MD  Multiple Vitamin (MULTIVITAMIN) tablet Take 1 tablet by mouth daily.   Yes Historical Provider, MD  Omega-3 Fatty Acids (FISH OIL PO) Take 1,200 mg by mouth daily.   Yes Historical Provider, MD  propranolol (INDERAL) 60 MG tablet Take 1 tablet (60 mg total) by mouth 2 (two) times daily. 04/29/14  Yes Wardell Honour, MD  psyllium (HYDROCIL/METAMUCIL) 95 % PACK Take 1  packet by mouth daily.   Yes Historical Provider, MD  tiZANidine (ZANAFLEX) 4 MG tablet Take 4 mg by mouth at bedtime as needed. For muscle spasms   Yes Historical Provider, MD  venlafaxine XR (EFFEXOR-XR) 75 MG 24 hr capsule Take 1 capsule (75 mg total) by mouth daily with breakfast. 04/29/14  Yes Wardell Honour,  MD      Review of Systems  Constitutional:       Hypertension  Cardiovascular: Negative for chest pain.       Intermittent fast heartbeat  Musculoskeletal: Positive for arthralgias (bilateral hand pain with both thumbs being affected).  All other systems reviewed and are negative.      Objective:   Physical Exam  Constitutional: She is oriented to person, place, and time. She appears well-developed and well-nourished. No distress.  Alert cooperative.   HENT:  Head: Normocephalic and atraumatic.  Eyes: EOM are normal.  Neck: Neck supple. No tracheal deviation present.  Cardiovascular: Normal rate, regular rhythm and normal heart sounds.   Brachial radialis and biceps 3+  Pulmonary/Chest: Effort normal and breath sounds normal. No respiratory distress.  Musculoskeletal: Normal range of motion.  Not moving either hand. Multiple surgical scars over the neck. Positive tenials and phalanx with healed surgical scars on both wrists.   Neurological: She is alert and oriented to person, place, and time.  Skin: Skin is warm and dry.  Psychiatric: She has a normal mood and affect. Her behavior is normal.  Nursing note and vitals reviewed.        BP 200/106 mmHg  Pulse 58  Temp(Src) 98.7 F (37.1 C) (Oral)  Resp 18  Ht 5' 2"  (1.575 m)  Wt 202 lb 9.6 oz (91.899 kg)  BMI 37.05 kg/m2  SpO2 96% Assessment & Plan:  DIAGNOSTIC STUDIES: Oxygen Saturation is 96% on room air, normal by my interpretation.    COORDINATION OF CARE: 4:46 PM-Discussed treatment plan which includes splints, Effexor 37.5 mg for a week, then every other day and then d/c medicine, increase Amlodipine  to 5 mg, and F/U with Dr. Tamala Julian with pt at bedside and pt agreed to plan.  We'll wean off the Effexor. We'll start at 37.5 mg a day for 1 week then every other day for 1 week then every 3 days for 1 week. I did place her in thumb spica splint since that irregular carpal tunnel splints because her thumbs are so painful. Referral has been made to orthopedics surgery for their evaluation. I increased her amlodipine to 5 mg a day. She has had some trouble with swelling in the past at 5 mg but I want to get her pressure down. She has been compliant with her medications suspect the Effexor has driven her pressure up  I personally performed the services described in this documentation, which was scribed in my presence. The recorded information has been reviewed and is accurate.

## 2014-06-04 ENCOUNTER — Other Ambulatory Visit: Payer: Self-pay | Admitting: Family Medicine

## 2014-06-08 ENCOUNTER — Encounter: Payer: Self-pay | Admitting: Family Medicine

## 2014-06-08 ENCOUNTER — Ambulatory Visit (INDEPENDENT_AMBULATORY_CARE_PROVIDER_SITE_OTHER): Payer: 59

## 2014-06-08 ENCOUNTER — Ambulatory Visit (INDEPENDENT_AMBULATORY_CARE_PROVIDER_SITE_OTHER): Payer: 59 | Admitting: Family Medicine

## 2014-06-08 VITALS — BP 142/78 | HR 74 | Temp 98.4°F | Resp 16 | Ht 62.0 in | Wt 205.0 lb

## 2014-06-08 DIAGNOSIS — I10 Essential (primary) hypertension: Secondary | ICD-10-CM

## 2014-06-08 DIAGNOSIS — J189 Pneumonia, unspecified organism: Secondary | ICD-10-CM

## 2014-06-08 DIAGNOSIS — M79645 Pain in left finger(s): Secondary | ICD-10-CM

## 2014-06-08 DIAGNOSIS — R319 Hematuria, unspecified: Secondary | ICD-10-CM

## 2014-06-08 DIAGNOSIS — M79644 Pain in right finger(s): Secondary | ICD-10-CM

## 2014-06-08 DIAGNOSIS — G4710 Hypersomnia, unspecified: Secondary | ICD-10-CM

## 2014-06-08 DIAGNOSIS — F411 Generalized anxiety disorder: Secondary | ICD-10-CM

## 2014-06-08 LAB — POCT URINALYSIS DIPSTICK
Bilirubin, UA: NEGATIVE
Blood, UA: NEGATIVE
Glucose, UA: NEGATIVE
Leukocytes, UA: NEGATIVE
Nitrite, UA: NEGATIVE
Protein, UA: 30
Spec Grav, UA: 1.015
Urobilinogen, UA: 0.2
pH, UA: 5

## 2014-06-08 LAB — POCT UA - MICROSCOPIC ONLY
Crystals, Ur, HPF, POC: NEGATIVE
Mucus, UA: POSITIVE
Yeast, UA: NEGATIVE
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
## 2014-06-11 ENCOUNTER — Other Ambulatory Visit: Payer: Self-pay | Admitting: Emergency Medicine

## 2014-06-12 NOTE — Telephone Encounter (Signed)
Dr Tamala Julian, I don't see that we have Rxd this for pt before, but you have seen pt for cervical DDD before. Do you want to RF this or RTC for eval?

## 2014-06-25 ENCOUNTER — Other Ambulatory Visit: Payer: Self-pay | Admitting: Family Medicine

## 2014-07-22 ENCOUNTER — Ambulatory Visit (INDEPENDENT_AMBULATORY_CARE_PROVIDER_SITE_OTHER): Payer: 59 | Admitting: Emergency Medicine

## 2014-07-22 VITALS — BP 140/90 | HR 71 | Temp 97.7°F | Resp 17 | Ht 62.5 in | Wt 206.0 lb

## 2014-07-22 DIAGNOSIS — R059 Cough, unspecified: Secondary | ICD-10-CM

## 2014-07-22 DIAGNOSIS — J029 Acute pharyngitis, unspecified: Secondary | ICD-10-CM

## 2014-07-22 DIAGNOSIS — R05 Cough: Secondary | ICD-10-CM

## 2014-07-22 LAB — POCT INFLUENZA A/B
Influenza A, POC: NEGATIVE
Influenza B, POC: NEGATIVE

## 2014-07-22 LAB — POCT RAPID STREP A (OFFICE): Rapid Strep A Screen: NEGATIVE

## 2014-07-22 MED ORDER — AMLODIPINE BESYLATE 10 MG PO TABS: 10.0000 mg | ORAL_TABLET | Freq: Every day | ORAL | Status: DC

## 2014-07-22 MED ORDER — AMOXICILLIN 875 MG PO TABS: 875.0000 mg | ORAL_TABLET | Freq: Two times a day (BID) | ORAL | Status: DC

## 2014-07-22 NOTE — Progress Notes (Signed)
   This chart was scribed for Darlyne Russian, MD by Edison Simon, ED Scribe. This patient was seen in room 9 and the patient's care was started at 1:38 PM.   Subjective:    Patient ID: Emily Melton, female    DOB: Feb 02, 1961, 54 y.o.   MRN: [id number]  Cough Associated symptoms include ear pain and a sore throat.  URI  Associated symptoms include congestion, coughing, ear pain, neck pain and a sore throat.  Hypertension Associated symptoms include neck pain.    HPI Comments: Emily Melton is a 54 y.o. female who presents to the Emergency Department complaining of with onset sore throat with onset 6 days ago, worsening progressively. She notes diffuse body aches, ear pain, and congestion. She reports pain in all her "glands," as well as to her posterior neck, ribs when deep breath, and shins. She states she has a tickle in her throat but denies significant coughing. She states her mucous is white, yellow, green, and slightly bloody. She states she looked at her tonsils and found them red and swollen with exudate. She notes she took care of her grandchildren 12/26-1/4 and notes one of them had rhinorrhea and another had an earache. She states she has had a flu shot. She states she has used Amoxicillin and Penicillin before without problems.  She notes an appointment with a specialist on 1/24 for problem with her thumbs.  Review of Systems  HENT: Positive for congestion, ear pain and sore throat.   Respiratory: Positive for cough.   Musculoskeletal: Positive for neck pain.       Body aches Pain to ribs and shins       Objective:   Physical Exam  CONSTITUTIONAL: Well developed/well nourished, alert and cooperative HEAD: Normocephalic/atraumatic EYES: EOMI/PERRL ENMT: Mucous membranes moist, nasal congestion, tonsils are 2+ enlarged with exuadate present bilaterally NECK: supple no meningeal signs, small tender anterior cervical nodes SPINE/BACK:entire spine nontender CV:  S1/S2 noted, no murmurs/rubs/gallops noted LUNGS: Lungs are clear to auscultation bilaterally, no apparent distress ABDOMEN: soft, nontender, no rebound or guarding, bowel sounds noted throughout abdomen GU:no cva tenderness NEURO: Pt is awake/alert/appropriate, moves all extremitiesx4.  No facial droop.   EXTREMITIES: pulses normal/equal, full ROM, wearing splint on left wrist SKIN: warm, color normal PSYCH: no abnormalities of mood noted, alert and oriented to situation  Results for orders placed or performed in visit on 07/22/14  POCT Influenza A/B  Result Value Ref Range   Influenza A, POC Negative    Influenza B, POC Negative   POCT rapid strep A  Result Value Ref Range   Rapid Strep A Screen Negative Negative      Assessment & Plan:  Strep test and flu tests are negative. We'll treat with amoxicillin twice a day for 10 days. She has a periodic nasal drainage along with the sore throat.I personally performed the services described in this documentation, which was scribed in my presence. The recorded information has been reviewed and is accurate.

## 2014-07-22 NOTE — Patient Instructions (Signed)

## 2014-07-22 NOTE — Progress Notes (Deleted)
° °  This chart was scribed for Darlyne Russian, MD by Edison Simon, ED Scribe. This patient was seen in room 9 and the patient's care was started at 1:38 PM.   Subjective:    Patient ID: Emily Melton, female    DOB: [birth year], 54 y.o.   MRN: [id number]  HPI  HPI Comments: Emily Melton is a 54 y.o. female who presents to the Emergency Department complaining of with onset sore throat with onset 6 days ago, worsening progressively. She notes diffuse body aches, ear pain, and congestion. She reports pain in all her "glands," as well as to her posterior neck, ribs when deep breath, and shins. She states she has a tickle in her throat but denies significant coughing. She states her mucous is white, yellow, green, and slightly bloody. She states she looked at her tonsils and found them red and swollen with exudate. She notes she took care of her grandchildren 12/26-1/4 and notes one of them had rhinorrhea and another had an earache. She states she has had a flu shot. She states she has used Amoxicillin and Penicillin before without problems.  She notes an appointment with a specialist on 1/24 for problem with her thumbs.  Review of Systems  HENT: Positive for congestion, ear pain and sore throat.   Respiratory: Negative for cough.   Musculoskeletal: Positive for neck pain.       Body aches Pain to ribs and shins       Objective:   Physical Exam  CONSTITUTIONAL: Well developed/well nourished, alert and cooperative HEAD: Normocephalic/atraumatic EYES: EOMI/PERRL ENMT: Mucous membranes moist, nasal congestion, tonsils are 2+ enlarged with exuadate present bilaterally NECK: supple no meningeal signs, small tender anterior cervical nodes SPINE/BACK:entire spine nontender CV: S1/S2 noted, no murmurs/rubs/gallops noted LUNGS: Lungs are clear to auscultation bilaterally, no apparent distress ABDOMEN: soft, nontender, no rebound or guarding, bowel sounds noted throughout abdomen GU:no cva  tenderness NEURO: Pt is awake/alert/appropriate, moves all extremitiesx4.  No facial droop.   EXTREMITIES: pulses normal/equal, full ROM, wearing splint on left wrist SKIN: warm, color normal PSYCH: no abnormalities of mood noted, alert and oriented to situation  Results for orders placed or performed in visit on 07/22/14  POCT Influenza A/B  Result Value Ref Range   Influenza A, POC Negative    Influenza B, POC Negative   POCT rapid strep A  Result Value Ref Range   Rapid Strep A Screen Negative Negative      Assessment & Plan:  Strep test and flu tests are negative. We'll treat with amoxicillin twice a day for 10 days. She has a periodic nasal drainage along with the sore throat.I personally performed the services described in this documentation, which was scribed in my presence. The recorded information has been reviewed and is accurate.

## 2014-07-24 LAB — CULTURE, GROUP A STREP: Organism ID, Bacteria: NORMAL

## 2014-08-25 ENCOUNTER — Other Ambulatory Visit: Payer: Self-pay | Admitting: Family Medicine

## 2014-09-05 ENCOUNTER — Ambulatory Visit (INDEPENDENT_AMBULATORY_CARE_PROVIDER_SITE_OTHER): Payer: 59

## 2014-09-06 ENCOUNTER — Ambulatory Visit (INDEPENDENT_AMBULATORY_CARE_PROVIDER_SITE_OTHER): Payer: 59 | Admitting: Family Medicine

## 2014-09-06 VITALS — BP 168/80 | HR 69 | Temp 98.5°F | Resp 18 | Wt 212.0 lb

## 2014-09-06 DIAGNOSIS — H8111 Benign paroxysmal vertigo, right ear: Secondary | ICD-10-CM

## 2014-09-06 DIAGNOSIS — I10 Essential (primary) hypertension: Secondary | ICD-10-CM

## 2014-09-06 DIAGNOSIS — E7800 Pure hypercholesterolemia, unspecified: Secondary | ICD-10-CM

## 2014-09-06 DIAGNOSIS — R42 Dizziness and giddiness: Secondary | ICD-10-CM

## 2014-09-06 DIAGNOSIS — J0101 Acute recurrent maxillary sinusitis: Secondary | ICD-10-CM

## 2014-09-06 LAB — POCT URINALYSIS DIPSTICK
Bilirubin, UA: NEGATIVE
Glucose, UA: NEGATIVE
Ketones, UA: NEGATIVE
Leukocytes, UA: NEGATIVE
Nitrite, UA: NEGATIVE
Spec Grav, UA: 1.025
Urobilinogen, UA: 0.2
pH, UA: 5

## 2014-09-06 LAB — POCT CBC
Granulocyte percent: 60.8 %G (ref 37–80)
HCT, POC: 39.7 % (ref 37.7–47.9)
Hemoglobin: 12.7 g/dL (ref 12.2–16.2)
Lymph, poc: 2.5 (ref 0.6–3.4)
MCH, POC: 29.5 pg (ref 27–31.2)
MCHC: 32 g/dL (ref 31.8–35.4)
MCV: 92.2 fL (ref 80–97)
MID (cbc): 0.4 (ref 0–0.9)
MPV: 9 fL (ref 0–99.8)
POC Granulocyte: 4.4 (ref 2–6.9)
POC LYMPH PERCENT: 34.3 %L (ref 10–50)
POC MID %: 4.9 %M (ref 0–12)
Platelet Count, POC: 179 10*3/uL (ref 142–424)
RBC: 4.31 M/uL (ref 4.04–5.48)
RDW, POC: 14 %
WBC: 7.3 10*3/uL (ref 4.6–10.2)

## 2014-09-06 LAB — COMPREHENSIVE METABOLIC PANEL
ALT: 33 U/L (ref 0–35)
AST: 26 U/L (ref 0–37)
Albumin: 4.1 g/dL (ref 3.5–5.2)
Alkaline Phosphatase: 65 U/L (ref 39–117)
BUN: 13 mg/dL (ref 6–23)
CO2: 26 mEq/L (ref 19–32)
Calcium: 9.8 mg/dL (ref 8.4–10.5)
Chloride: 106 mEq/L (ref 96–112)
Creat: 0.63 mg/dL (ref 0.50–1.10)
Glucose, Bld: 112 mg/dL — ABNORMAL HIGH (ref 70–99)
Potassium: 4.3 mEq/L (ref 3.5–5.3)
Sodium: 141 mEq/L (ref 135–145)
Total Bilirubin: 0.4 mg/dL (ref 0.2–1.2)
Total Protein: 6.5 g/dL (ref 6.0–8.3)

## 2014-09-06 LAB — TSH: TSH: 2.527 u[IU]/mL (ref 0.350–4.500)

## 2014-09-06 MED ORDER — MECLIZINE HCL 25 MG PO TABS: 25.0000 mg | ORAL_TABLET | Freq: Three times a day (TID) | ORAL | Status: DC | PRN

## 2014-09-06 MED ORDER — AMOXICILLIN 500 MG PO CAPS: 1000.0000 mg | ORAL_CAPSULE | Freq: Two times a day (BID) | ORAL | Status: DC

## 2014-09-06 NOTE — Progress Notes (Signed)
Subjective:    Patient ID: Emily Melton, female    DOB: 09/20/1960, 54 y.o.   MRN: [id number]  09/06/2014  Dizziness and Emesis   HPI This 54 y.o. female presents for evaluation of dizziness, emesis.   Onset three weeks ago.  Started upon getting up with light-headed.  Associated nausea and vomiting with unsteadiness.  No room spinning in the beginning.  Has been falling to the left.  Would occur with position changes.  Vomited x 6 during first week of dizziness.  No recurrent vomiting since dizziness started.  Has felt as uneasy in stomach today but has not vomited.  The room will spend with laying supine; typically laying down on side on R with mild to moderate dizziness for one minute.  When getting up, when almost upright, will get dizziness for 1 minute. Having some under L eye twitching.  No fever/chills/sweats.  Some pressure in L side of headache and ache in L side of neck.  No focal weakness; no n/t.  No blurred vision.  If moving eyes will trigger dizziness but usually due to looking up will trigger dizziness. Turning head to one side will trigger dizziness. No syncope.  Mild nasal congestion which is clear; no thick or discolored.  Mild swelling L maxillary region; behind eyes burn at times.  Started new MVI in November 2015.  In January treated with sinus infection in early January.  Blood pressure running 120-147/71-80.  Feels swollen; has gained six pounds in six weeks.  No hearing loss; no tinnitus.    Review of Systems  Constitutional: Positive for unexpected weight change. Negative for fever, chills, diaphoresis and fatigue.  HENT: Positive for congestion and sinus pressure. Negative for ear pain, nosebleeds, postnasal drip, rhinorrhea and sore throat.   Eyes: Negative for photophobia and visual disturbance.  Respiratory: Negative for cough and shortness of breath.   Cardiovascular: Negative for chest pain, palpitations and leg swelling.  Gastrointestinal: Positive for  nausea. Negative for vomiting, abdominal pain and diarrhea.  Neurological: Positive for dizziness and light-headedness. Negative for tremors, syncope, facial asymmetry, speech difficulty, weakness, numbness and headaches.    Past Medical History  Diagnosis Date  . Depression   . Narcolepsy     w/o cataplexy  . GERD (gastroesophageal reflux disease)   . HTN (hypertension)   . 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 Chicken pox     childhood  . Migraine, unspecified, without mention of intractable migraine without mention of status migrainosus   . Periodic limb movement disorder   . Nonspecific abnormal electrocardiogram (ECG) (EKG)   . Other and unspecified disc disorder of cervical region   . Diaphragmatic hernia without mention of obstruction or gangrene   . Tobacco use disorder   . Barrett's esophagus   . Personal history of colonic polyps   . Allergic rhinitis, cause unspecified   . Insomnia, unspecified   . Unspecified vitamin D deficiency   . Other abnormal glucose   . Pre-operative cardiovascular examination   . Anemia   . Allergy   . Osteoporosis    Past Surgical History  Procedure Laterality Date  . Cesarean section      x2  1984 and 1986  . Pilonidal cyst resection  1992  . Cholecystectomy    . Carpal tunnel release - bilateral    . Dilation and curettage of uterus      SAB  .  Tubel ligation  2002  . Breast biopsy  2005     R   Benign  . Fusion of cervical spine  2007    C5,C6,C7, and T1  . Laprascopy  2009    for RLQ pain:R oophorectomy performed no improvement in pain.   . Vaginal hysterectomy  2009    fibroid/endometriosis, one L ovary remaining  . R shoulder surgery  06/24/09    2 tears in rotator cuff,bone spurs. Dr. Modena Morrow  . 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 Spine surgery  09/2011    Cervical spine fusion  . Back surgery    . Tubal ligation    . Colonoscopy  03/17/2012    [facility]    . Esophagogastroduodenoscopy  03/17/2012    no Barrett's esophagus. [facility].  Marland Kitchen Abdominal hysterectomy      fibroids and endometriosis.  ovarian resection R.   Allergies  Allergen Reactions  . St Johns Wort Anaphylaxis  . Bupropion Hcl   . Celebrex [Celecoxib]   . Effexor [Venlafaxine] Other (See Comments)    Moodiness, vivid dreams, poor sleep cycle.  . Lyrica [Pregabalin]   . Methylphenidate Hcl   . Modafinil   . Rabeprazole Sodium    Current Outpatient Prescriptions  Medication Sig Dispense Refill  . amLODipine (NORVASC) 10 MG tablet Take 1 tablet (10 mg total) by mouth daily. (Patient taking differently: Take 5 mg by mouth daily. ) 90 tablet 3  . B Complex Vitamins (B COMPLEX 50 PO) Take 50 mg by mouth daily.    . cetirizine (ZYRTEC) 10 MG tablet Take 10 mg by mouth daily.      . Cholecalciferol (VITAMIN D3) 2000 UNITS TABS Take 1 tablet by mouth daily.     Marland Kitchen dexlansoprazole (DEXILANT) 60 MG capsule Take 60 mg by mouth daily.      . hyoscyamine (ANASPAZ) 0.125 MG TBDP Place 0.125-0.25 mg under the tongue every 6 (six) hours as needed. For stomach cramps    . losartan (COZAAR) 100 MG tablet Take 1 tablet (100 mg total) by mouth daily. 30 tablet 1  . montelukast (SINGULAIR) 10 MG tablet TAKE 1 TABLET (10 MG TOTAL) BY MOUTH AT BEDTIME. 30 tablet 5  . Multiple Vitamin (MULTIVITAMIN) tablet Take 1 tablet by mouth daily.    . Omega-3 Fatty Acids (FISH OIL PO) Take 1,200 mg by mouth daily.    . propranolol (INDERAL) 20 MG tablet TAKE 3 TABLETS (60 MG TOTAL) BY MOUTH 2 (TWO) TIMES DAILY. 180 tablet 5  . propranolol (INDERAL) 60 MG tablet Take 1 tablet (60 mg total) by mouth 2 (two) times daily. 60 tablet 1  . psyllium (HYDROCIL/METAMUCIL) 95 % PACK Take 1 packet by mouth daily.    Marland Kitchen tiZANidine (ZANAFLEX) 4 MG capsule TAKE 1 CAPSULE (4 MG TOTAL) BY MOUTH NIGHTLY. 30 capsule 5  . tiZANidine (ZANAFLEX) 4 MG tablet Take 4 mg by mouth at bedtime as needed. For muscle spasms    .  amoxicillin (AMOXIL) 500 MG capsule Take 2 capsules (1,000 mg total) by mouth 2 (two) times daily. 40 capsule 0  . meclizine (ANTIVERT) 25 MG tablet Take 1 tablet (25 mg total) by mouth 3 (three) times daily as needed for dizziness. 30 tablet 0   No current facility-administered medications for this visit.       Objective:    BP 170/90 mmHg  Pulse 69  Temp(Src) 98.5 F (36.9 C) (Oral)  Resp 18  Wt  212 lb (96.163 kg)  SpO2 97% Physical Exam  Constitutional: She is oriented to person, place, and time. She appears well-developed and well-nourished. No distress.  HENT:  Head: Normocephalic and atraumatic.  Right Ear: External ear normal.  Left Ear: External ear normal.  Nose: Right sinus exhibits no maxillary sinus tenderness and no frontal sinus tenderness. Left sinus exhibits maxillary sinus tenderness. Left sinus exhibits no frontal sinus tenderness.  Mouth/Throat: Oropharynx is clear and moist.  Eyes: Conjunctivae and EOM are normal. Pupils are equal, round, and reactive to light.  Neck: Normal range of motion. Neck supple. Carotid bruit is not present. No thyromegaly present.  Cardiovascular: Normal rate, regular rhythm, normal heart sounds and intact distal pulses.  Exam reveals no gallop and no friction rub.   No murmur heard. Pulmonary/Chest: Effort normal and breath sounds normal. She has no wheezes. She has no rales.  Abdominal: Soft. Bowel sounds are normal. She exhibits no distension and no mass. There is no tenderness. There is no rebound and no guarding.  Lymphadenopathy:    She has no cervical adenopathy.  Neurological: She is alert and oriented to person, place, and time. She has normal strength. No cranial nerve deficit or sensory deficit. She exhibits normal muscle tone. She displays a negative Romberg sign. Coordination normal.  Dix-Halpike with nystagmus to the L; +dizziness.  Skin: Skin is warm and dry. No rash noted. She is not diaphoretic. No erythema. No pallor.    Psychiatric: She has a normal mood and affect. Her behavior is normal.   Results for orders placed or performed in visit on 09/06/14  POCT urinalysis dipstick  Result Value Ref Range   Color, UA yellow    Clarity, UA clear    Glucose, UA neg    Bilirubin, UA neg    Ketones, UA neg    Spec Grav, UA 1.025    Blood, UA trace    pH, UA 5.0    Protein, UA trace    Urobilinogen, UA 0.2    Nitrite, UA neg    Leukocytes, UA Negative   POCT CBC  Result Value Ref Range   WBC 7.3 4.6 - 10.2 K/uL   Lymph, poc 2.5 0.6 - 3.4   POC LYMPH PERCENT 34.3 10 - 50 %L   MID (cbc) 0.4 0 - 0.9   POC MID % 4.9 0 - 12 %M   POC Granulocyte 4.4 2 - 6.9   Granulocyte percent 60.8 37 - 80 %G   RBC 4.31 4.04 - 5.48 M/uL   Hemoglobin 12.7 12.2 - 16.2 g/dL   HCT, POC 39.7 37.7 - 47.9 %   MCV 92.2 80 - 97 fL   MCH, POC 29.5 27 - 31.2 pg   MCHC 32.0 31.8 - 35.4 g/dL   RDW, POC 14.0 %   Platelet Count, POC 179 142 - 424 K/uL   MPV 9.0 0 - 99.8 fL       Assessment & Plan:   1. Dizziness   2. Essential hypertension, benign   3. Pure hypercholesterolemia   4. Benign paroxysmal positional vertigo, right   5. Acute recurrent maxillary sinusitis      1. Benign positional vertigo:  New.  Rx for Meclizine provided; Epley's maneuvers provided to perform daily. If no improvement in 2 weeks, refer to ENT and obtain MRI/MRI brain.  Normal neurological exam in office; obtain labs to rule out secondary contributing causes. 2.  HTN: elevated; with recent fluid retention.  Add HCTZ  80m daily for next 5 days to help with BP and fluid retention; obtain labs. 3.  Hypercholesterolemia: uncontrolled.  Refuses statin. 4.  Acute maxillary sinusitis: Improved but persistent; refill of Amoxicillin provided.      Meds ordered this encounter  Medications  . amoxicillin (AMOXIL) 500 MG capsule    Sig: Take 2 capsules (1,000 mg total) by mouth 2 (two) times daily.    Dispense:  40 capsule    Refill:  0  . meclizine  (ANTIVERT) 25 MG tablet    Sig: Take 1 tablet (25 mg total) by mouth 3 (three) times daily as needed for dizziness.    Dispense:  30 tablet    Refill:  0    No Follow-up on file.    Tanmay Halteman MElayne Guerin M.D. [facility]  [phone]

## 2014-09-06 NOTE — Patient Instructions (Addendum)
1.  Call with update in 2 weeks.  Epley Maneuver Self-Care WHAT IS THE EPLEY MANEUVER? The Epley maneuver is an exercise you can do to relieve symptoms of benign paroxysmal positional vertigo (BPPV). This condition is often just referred to as vertigo. BPPV is caused by the movement of tiny crystals (canaliths) inside your inner ear. The accumulation and movement of canaliths in your inner ear causes a sudden spinning sensation (vertigo) when you move your head to certain positions. Vertigo usually lasts about 30 seconds. BPPV usually occurs in just one ear. If you get vertigo when you lie on your left side, you probably have BPPV in your left ear. Your health care provider can tell you which ear is involved.  BPPV may be caused by a head injury. Many people older than 50 get BPPV for unknown reasons. If you have been diagnosed with BPPV, your health care provider may teach you how to do this maneuver. BPPV is not life threatening (benign) and usually goes away in time.  WHEN SHOULD I PERFORM THE EPLEY MANEUVER? You can do this maneuver at home whenever you have symptoms of vertigo. You may do the Epley maneuver up to 3 times a day until your symptoms of vertigo go away. HOW SHOULD I DO THE EPLEY MANEUVER? 1. Sit on the edge of a bed or table with your back straight. Your legs should be extended or hanging over the edge of the bed or table.  2. Turn your head halfway toward the affected ear.  3. Lie backward quickly with your head turned until you are lying flat on your back. You may want to position a pillow under your shoulders.  4. Hold this position for 30 seconds. You may experience an attack of vertigo. This is normal. Hold this position until the vertigo stops. 5. Then turn your head to the opposite direction until your unaffected ear is facing the floor.  6. Hold this position for 30 seconds. You may experience an attack of vertigo. This is normal. Hold this position until the vertigo  stops. 7. Now turn your whole body to the same side as your head. Hold for another 30 seconds.  8. You can then sit back up. ARE THERE RISKS TO THIS MANEUVER? In some cases, you may have other symptoms (such as changes in your vision, weakness, or numbness). If you have these symptoms, stop doing the maneuver and call your health care provider. Even if doing these maneuvers relieves your vertigo, you may still have dizziness. Dizziness is the sensation of light-headedness but without the sensation of movement. Even though the Epley maneuver may relieve your vertigo, it is possible that your symptoms will return within 5 years. WHAT SHOULD I DO AFTER THIS MANEUVER? After doing the Epley maneuver, you can return to your normal activities. Ask your doctor if there is anything you should do at home to prevent vertigo. This may include:  Sleeping with two or more pillows to keep your head elevated.  Not sleeping on the side of your affected ear.  Getting up slowly from bed.  Avoiding sudden movements during the day.  Avoiding extreme head movement, like looking up or bending over.  Wearing a cervical collar to prevent sudden head movements. WHAT SHOULD I DO IF MY SYMPTOMS GET WORSE? Call your health care provider if your vertigo gets worse. Call your provider right way if you have other symptoms, including:   Nausea.  Vomiting.  Headache.  Weakness.  Numbness.  Vision changes. Document Released: 07/08/2013 Document Reviewed: 07/08/2013 [facility] Patient Information 2015 [place], [place]. This information is not intended to replace advice given to you by your health care provider. Make sure you discuss any questions you have with your health care provider.

## 2014-09-17 ENCOUNTER — Telehealth: Payer: Self-pay

## 2014-09-17 DIAGNOSIS — R42 Dizziness and giddiness: Secondary | ICD-10-CM

## 2014-09-17 NOTE — Telephone Encounter (Signed)
Pt of Dr. Tamala Julian Advised to follow up with Dr. Tamala Julian about recovery from previous appt. Pt states that the dizziness has not approved at all, even with the completion of her antibiotics. She states that when she lies down she sometimes blacks out/ falls asleep with no recollection.  She states that she has also notices her left eye is not blood shot. Please advise pt

## 2014-09-18 ENCOUNTER — Encounter: Payer: Self-pay | Admitting: Family Medicine

## 2014-09-19 NOTE — Telephone Encounter (Signed)
Call --- recommend referral to ENT in [facility] and MRI brain.  Does she have a preference of ENT physicians in [place]?

## 2014-09-20 NOTE — Telephone Encounter (Signed)
Spoke with pt. She sent you a MyChart message. Has no preference for ENT

## 2014-09-20 NOTE — Telephone Encounter (Signed)
Referral to ENT in [place] placed.

## 2014-09-20 NOTE — Telephone Encounter (Signed)
Left message on machine to call back  

## 2014-09-25 ENCOUNTER — Other Ambulatory Visit: Payer: Self-pay | Admitting: Family Medicine

## 2014-09-25 ENCOUNTER — Ambulatory Visit
Admission: RE | Admit: 2014-09-25 | Discharge: 2014-09-25 | Disposition: A | Discharge status: Home / Self Care | Payer: 59 | Source: Ambulatory Visit | Attending: Family Medicine | Admitting: Family Medicine

## 2014-09-25 DIAGNOSIS — R42 Dizziness and giddiness: Secondary | ICD-10-CM

## 2014-10-03 ENCOUNTER — Other Ambulatory Visit: Payer: Self-pay

## 2014-10-05 ENCOUNTER — Ambulatory Visit (INDEPENDENT_AMBULATORY_CARE_PROVIDER_SITE_OTHER): Payer: 59 | Admitting: Family Medicine

## 2014-10-05 ENCOUNTER — Encounter: Payer: Self-pay | Admitting: Family Medicine

## 2014-10-05 VITALS — BP 144/80 | HR 61 | Temp 98.0°F | Resp 16 | Ht 62.5 in | Wt 210.8 lb

## 2014-10-05 DIAGNOSIS — E785 Hyperlipidemia, unspecified: Secondary | ICD-10-CM

## 2014-10-05 DIAGNOSIS — F430 Acute stress reaction: Secondary | ICD-10-CM | POA: Diagnosis not present

## 2014-10-05 DIAGNOSIS — H8113 Benign paroxysmal vertigo, bilateral: Secondary | ICD-10-CM | POA: Diagnosis not present

## 2014-10-05 DIAGNOSIS — I10 Essential (primary) hypertension: Secondary | ICD-10-CM | POA: Diagnosis not present

## 2014-10-05 DIAGNOSIS — G894 Chronic pain syndrome: Secondary | ICD-10-CM

## 2014-10-05 NOTE — Progress Notes (Signed)
Subjective:    Patient ID: Emily Melton, female    DOB: 1960-10-31, 54 y.o.   MRN: [id number]  10/05/2014  Hypertension and Dizziness   HPI This 54 y.o. female presents for four month follow-up:  1.  Benign Positional Vertigo:  S/p MRI/MRA brain that was negative.  S/p Eply Maneuvers with audiologist on 09/29/14 which seemed to cure vertigo.  S/p ENT consultation by [facility].  After Eply Maneuvers was advised to keep head elevated.  Had goggles placed. No dizziness.   Had an excellent experience with Dr. Pryor Ochoa.  Has a follow-up in ten days.  May warrant carotid doppler if dizziness persists.  No recurrent dizziness.  Eply maneuvers performed by patient were not successful.  Looking up and down now does not cause dizziness.  No trigger to dizziness/vertigo.  CBC, CMET, TSH, u/a all negative at last visit in 08/2014.    2. Acute stress reaction:  Husband talks about divorce.  Just completed will.  Ongoing for past year.  Husband reports that pt has changed.  Plans to help daughter out this summer.  Husband does not want grandchildren at his house.    3.  HTN:  Patient reports good compliance with medication, good tolerance to medication, and good symptom control.  Home BP readings running   4. Chronic pain syndrome:  Unable to work currently; Chief Executive Officer states that case will need to be transferred to [facility] if patient moved to daughter's area; would like to get own place; [place].   Dr. Amedeo Plenty.     Review of Systems  Constitutional: Negative for fever, chills, diaphoresis and fatigue.  Eyes: Negative for visual disturbance.  Respiratory: Negative for cough and shortness of breath.   Cardiovascular: Negative for chest pain, palpitations and leg swelling.  Gastrointestinal: Negative for nausea, vomiting, abdominal pain, diarrhea and constipation.  Endocrine: Negative for cold intolerance, heat intolerance, polydipsia, polyphagia and polyuria.  Musculoskeletal: Positive for  myalgias, back pain, arthralgias, neck pain and neck stiffness.  Neurological: Negative for dizziness, tremors, seizures, syncope, facial asymmetry, speech difficulty, weakness, light-headedness, numbness and headaches.  Psychiatric/Behavioral: Positive for dysphoric mood. Negative for suicidal ideas, sleep disturbance and self-injury.    Past Medical History  Diagnosis Date  . Depression   . Narcolepsy     w/o cataplexy  . GERD (gastroesophageal reflux disease)   . HTN (hypertension)   . 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 Chicken pox     childhood  . Migraine, unspecified, without mention of intractable migraine without mention of status migrainosus   . Periodic limb movement disorder   . Nonspecific abnormal electrocardiogram (ECG) (EKG)   . Other and unspecified disc disorder of cervical region   . Diaphragmatic hernia without mention of obstruction or gangrene   . Tobacco use disorder   . Barrett's esophagus   . Personal history of colonic polyps   . Allergic rhinitis, cause unspecified   . Insomnia, unspecified   . Unspecified vitamin D deficiency   . Other abnormal glucose   . Pre-operative cardiovascular examination   . Anemia   . Allergy   . Osteoporosis    Past Surgical History  Procedure Laterality Date  . Cesarean section      x2  1984 and 1986  . Pilonidal cyst resection  1992  . Cholecystectomy    . Carpal tunnel release - bilateral    . Dilation  and curettage of uterus      SAB  . Tubel ligation  2002  . Breast biopsy  2005     R   Benign  . Fusion of cervical spine  2007    C5,C6,C7, and T1  . Laprascopy  2009    for RLQ pain:R oophorectomy performed no improvement in pain.   . Vaginal hysterectomy  2009    fibroid/endometriosis, one L ovary remaining  . R shoulder surgery  06/24/09    2 tears in rotator cuff,bone spurs. Dr. Modena Morrow  . 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 Spine surgery   09/2011    Cervical spine fusion  . Back surgery    . Tubal ligation    . Colonoscopy  03/17/2012    [facility]  . Esophagogastroduodenoscopy  03/17/2012    no Barrett's esophagus. [facility].  Marland Kitchen Abdominal hysterectomy      fibroids and endometriosis.  ovarian resection R.   Allergies  Allergen Reactions  . St Johns Wort Anaphylaxis  . Bupropion Hcl   . Celebrex [Celecoxib]   . Effexor [Venlafaxine] Other (See Comments)    Moodiness, vivid dreams, poor sleep cycle.  . Lyrica [Pregabalin]   . Methylphenidate Hcl   . Modafinil   . Rabeprazole Sodium    Current Outpatient Prescriptions  Medication Sig Dispense Refill  . amLODipine (NORVASC) 10 MG tablet Take 1 tablet (10 mg total) by mouth daily. (Patient taking differently: Take 5 mg by mouth daily. ) 90 tablet 3  . B Complex Vitamins (B COMPLEX 50 PO) Take 50 mg by mouth daily.    . cetirizine (ZYRTEC) 10 MG tablet Take 10 mg by mouth daily.      . Cholecalciferol (VITAMIN D3) 2000 UNITS TABS Take 1 tablet by mouth daily.     Marland Kitchen dexlansoprazole (DEXILANT) 60 MG capsule Take 60 mg by mouth daily.      . hyoscyamine (ANASPAZ) 0.125 MG TBDP Place 0.125-0.25 mg under the tongue every 6 (six) hours as needed. For stomach cramps    . losartan (COZAAR) 100 MG tablet Take 1 tablet (100 mg total) by mouth daily. 30 tablet 1  . montelukast (SINGULAIR) 10 MG tablet TAKE 1 TABLET (10 MG TOTAL) BY MOUTH AT BEDTIME. 30 tablet 5  . Multiple Vitamin (MULTIVITAMIN) tablet Take 1 tablet by mouth daily.    . Omega-3 Fatty Acids (FISH OIL PO) Take 1,200 mg by mouth daily.    . propranolol (INDERAL) 20 MG tablet TAKE 3 TABLETS (60 MG TOTAL) BY MOUTH 2 (TWO) TIMES DAILY. 180 tablet 5  . propranolol (INDERAL) 60 MG tablet Take 1 tablet (60 mg total) by mouth 2 (two) times daily. 60 tablet 1  . psyllium (HYDROCIL/METAMUCIL) 95 % PACK Take 1 packet by mouth daily.    Marland Kitchen tiZANidine (ZANAFLEX) 4 MG capsule TAKE 1 CAPSULE (4 MG TOTAL) BY MOUTH NIGHTLY. 30  capsule 5  . tiZANidine (ZANAFLEX) 4 MG tablet Take 4 mg by mouth at bedtime as needed. For muscle spasms    . meclizine (ANTIVERT) 25 MG tablet Take 1 tablet (25 mg total) by mouth 3 (three) times daily as needed for dizziness. (Patient not taking: Reported on 10/05/2014) 30 tablet 0   No current facility-administered medications for this visit.       Objective:    BP 144/80 mmHg  Pulse 61  Temp(Src) 98 F (36.7 C) (Oral)  Resp 16  Ht 5' 2.5" (1.588 m)  Wt 210 lb 12.8 oz (95.618 kg)  BMI 37.92 kg/m2  SpO2 95% Physical Exam  Constitutional: She is oriented to person, place, and time. She appears well-developed and well-nourished. No distress.  HENT:  Head: Normocephalic and atraumatic.  Right Ear: External ear normal.  Left Ear: External ear normal.  Nose: Nose normal.  Mouth/Throat: Oropharynx is clear and moist.  Eyes: Conjunctivae and EOM are normal. Pupils are equal, round, and reactive to light.  Neck: Normal range of motion. Neck supple. Carotid bruit is not present. No thyromegaly present.  Cardiovascular: Normal rate, regular rhythm, normal heart sounds and intact distal pulses.  Exam reveals no gallop and no friction rub.   No murmur heard. Pulmonary/Chest: Effort normal and breath sounds normal. She has no wheezes. She has no rales.  Abdominal: Soft. Bowel sounds are normal. She exhibits no distension and no mass. There is no tenderness. There is no rebound and no guarding.  Lymphadenopathy:    She has no cervical adenopathy.  Neurological: She is alert and oriented to person, place, and time. No cranial nerve deficit. She exhibits normal muscle tone. Coordination normal.  Dix Hallpike negative today B.  Skin: Skin is warm and dry. No rash noted. She is not diaphoretic. No erythema. No pallor.  Psychiatric: She has a normal mood and affect. Her behavior is normal. Judgment and thought content normal.  Tearful.   Results for orders placed or performed in visit on  10/05/14  Lipid panel  Result Value Ref Range   Cholesterol 194 0 - 200 mg/dL   Triglycerides 107 <150 mg/dL   HDL 51 >=46 mg/dL   Total CHOL/HDL Ratio 3.8 Ratio   VLDL 21 0 - 40 mg/dL   LDL Cholesterol 122 (H) 0 - 99 mg/dL       Assessment & Plan:   1. Hyperlipidemia   2. Essential hypertension, benign   3. Benign paroxysmal positional vertigo, bilateral   4. Chronic pain syndrome   5. Stress reaction     1. Hyperlipidemia: uncontrolled; pt refuses statin therapy.  Obtain labs. 2.  HTN: improved with increase of Amlodipine to 9m daily; minimal leg swelling; obtain labs.  No change to therapy today. 3.  Benign positional vertigo: Improved s/p Eply maneuvers at Dr. VDarien RamusENT office.  S/p MRI brain negative.   4.  Chronic pain syndrome: persistent and uncontrolled; interferes with ability to work; greatly impacting quality of life.  Causing marital strain. 5.  Stress reaction: worsening; husband threatening divorce due to patient's inability to work and to have quality of life.  Patient going to stay with daughter over the summer months to help with child care.     No orders of the defined types were placed in this encounter.    Return in about 6 months (around 04/07/2015) for complete physical examiniation.    Saharra Santo MElayne Guerin M.D. [facility]  [phone]

## 2014-10-06 LAB — LIPID PANEL
Cholesterol: 194 mg/dL (ref 0–200)
HDL: 51 mg/dL (ref >=46)
LDL Cholesterol: 122 mg/dL — ABNORMAL HIGH (ref 0–99)
Total CHOL/HDL Ratio: 3.8 Ratio
Triglycerides: 107 mg/dL (ref <150)
VLDL: 21 mg/dL (ref 0–40)

## 2014-10-22 ENCOUNTER — Other Ambulatory Visit: Payer: Self-pay

## 2014-10-22 DIAGNOSIS — I10 Essential (primary) hypertension: Secondary | ICD-10-CM

## 2014-10-22 MED ORDER — MONTELUKAST SODIUM 10 MG PO TABS: ORAL_TABLET | ORAL | Status: DC

## 2014-10-22 MED ORDER — TIZANIDINE HCL 4 MG PO CAPS: ORAL_CAPSULE | ORAL | Status: DC

## 2014-10-22 MED ORDER — LOSARTAN POTASSIUM 100 MG PO TABS: 100.0000 mg | ORAL_TABLET | Freq: Every day | ORAL | Status: DC

## 2014-10-22 MED ORDER — AMLODIPINE BESYLATE 10 MG PO TABS: 10.0000 mg | ORAL_TABLET | Freq: Every day | ORAL | Status: DC

## 2014-10-22 MED ORDER — PROPRANOLOL HCL 20 MG PO TABS: ORAL_TABLET | ORAL | Status: DC

## 2014-10-23 DIAGNOSIS — Z0271 Encounter for disability determination: Secondary | ICD-10-CM

## 2014-10-28 ENCOUNTER — Telehealth: Payer: Self-pay

## 2014-10-28 DIAGNOSIS — G4710 Hypersomnia, unspecified: Secondary | ICD-10-CM

## 2014-10-28 NOTE — Telephone Encounter (Signed)
Dr Tamala Julian, can we do another referral? I can put the order in.

## 2014-10-28 NOTE — Telephone Encounter (Signed)
Pt is needing a new referrral to [facility]-the original one has expired and she is unable to schedule with it

## 2014-10-28 NOTE — Telephone Encounter (Signed)
New order placed for sleep medicine consultation.

## 2014-10-29 NOTE — Telephone Encounter (Signed)
Spoke with pt, advised referral sent in.

## 2014-11-10 ENCOUNTER — Encounter: Payer: Self-pay | Admitting: Neurology

## 2014-11-10 ENCOUNTER — Ambulatory Visit (INDEPENDENT_AMBULATORY_CARE_PROVIDER_SITE_OTHER): Payer: 59 | Admitting: Neurology

## 2014-11-10 VITALS — BP 154/82 | HR 78 | Resp 16 | Ht 62.5 in | Wt 216.0 lb

## 2014-11-10 DIAGNOSIS — G4710 Hypersomnia, unspecified: Secondary | ICD-10-CM

## 2014-11-10 DIAGNOSIS — G2581 Restless legs syndrome: Secondary | ICD-10-CM

## 2014-11-10 DIAGNOSIS — R0683 Snoring: Secondary | ICD-10-CM | POA: Diagnosis not present

## 2014-11-10 DIAGNOSIS — E669 Obesity, unspecified: Secondary | ICD-10-CM | POA: Diagnosis not present

## 2014-11-10 DIAGNOSIS — Z9889 Other specified postprocedural states: Secondary | ICD-10-CM

## 2014-11-10 DIAGNOSIS — G4761 Periodic limb movement disorder: Secondary | ICD-10-CM | POA: Diagnosis not present

## 2014-11-10 DIAGNOSIS — M549 Dorsalgia, unspecified: Secondary | ICD-10-CM | POA: Diagnosis not present

## 2014-11-10 NOTE — Patient Instructions (Addendum)
We will do overnight sleep testing and next day nap testing. For this you need to be off of your Tizanidine and hydromorphone for at least 10-14 days. We will see you back after sleep testing.  You have a history of significant sleepiness.  Do not drive if you feel sleepy.  We will also look for signs of obstructive sleep apnea during sleep testing and leg movements.

## 2014-11-10 NOTE — Progress Notes (Signed)
Subjective:    Patient ID: Emily Melton is a 54 y.o. female.  HPI     Star Age, MD, PhD [facility] [address] [address] [place], [place] [address]  Dear Dr. Tamala Julian,  I saw your patient, Emily Melton, upon your kind request in my neurologic clinic today for initial consultation of her sleep disorder, in particular her excessive daytime somnolence. The patient is unaccompanied today. As you know, Emily Melton is a 54 year old right-handed woman with an underlying complex medical history of hypertension, reflux disease, vitamin D deficiency, vertigo, anemia, allergies, arthritis, degenerative spine disease, status post cervical spine fusion, migraine headaches, smoking, depression and obesity, who reports significant daytime somnolence dating back to 1998 or so. 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 At the time, she was residing in [place]. She had a sleep study overnight under the care of Dr. Manuella Ghazi from [facility] neurology in [place], [place]. She says the sleep study did not show any sleep apnea but showed signs of narcolepsy. She did not have a nap study the next day as I understand. She reports that she went into REM sleep fairly quickly that night. She was diagnosed with narcolepsy based on these test results and tried on Ritalin. It helped her stay more alert but she had an allergic reaction to it and had to stop it. She also tried Provigil which caused leg swelling and blood pressure rises. She also came off of that. She then was placed on Dexedrine up to 4 times a day but was using primarily for driving. She was working at the time and needed to drive for at least 20 minutes each way and it helped her stay awake. She did not have side effects. She does snore but denies any gasping sensations while asleep. She moved to [place], [place] in 2002 after she got divorced from her first husband. She moved in with her  sister. She then met her current husband are not married. She endorses significant daytime somnolence, denies any cataplexy, denies hypnagogic or hypnopompic hallucinations and has not experienced any sleep paralysis. She has vivid dreams. Her husband has not noticed any breathing pauses while she is asleep. She does report nocturia once or twice. She currently does not work and does not keep a very set schedule for her sleep time and rise time. She goes to bed maybe around 10 PM and falls asleep after a few minutes. She cannot sleep on her back. She has significant neck pain and says she still has radiating pain to her right arm. She had a total of 3 neck surgeries and 2 right shoulder surgeries. She is to see pain management in the past and was treated with hydromorphone. She has had some pills left from a prescription from 2014 and uses hydromorphone very infrequently. She does take tizanidine almost every night. She was tried in the past on Lyrica and Cymbalta and other medications that did not help or caused side effects. She was also diagnosed with fibromyalgia several years ago. Prior records from her sleep doctor and prior sleep test results are not available for review today. We will request records from Dr. Trena Platt office.  She had a brain MRI without contrast on 09/25/2014: Negative MRI of brain without contrast. In addition, personally reviewed the images through the PACS system.  She has occasional restless leg symptoms. These symptoms typically do not keep her up. She has been told that she twitches in her sleep  and her sleep study apparently showed leg twitching. She has no family history of narcolepsy or OSA. Her rise time is around 7:30 to 8:30. Sometimes she stays in bed longer she does not have an appointment has to be somewhere. Her current Epworth Sleepiness Scale score is 18 out of 24 and her fatigue score is 62. Since her sleep study testing some 16 years ago she has gained weight. She is  not able to exercise very much. She reports neck pain and shoulder pain on the right. She also reports low back pain and had tailbone surgery in the past. She has a history of pilonidal cyst repair.   Her Past Medical History Is Significant For: Past Medical History  Diagnosis Date  . Depression   . Narcolepsy     w/o cataplexy  . GERD (gastroesophageal reflux disease)   . HTN (hypertension)   . 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 Chicken pox     childhood  . Migraine, unspecified, without mention of intractable migraine without mention of status migrainosus   . Periodic limb movement disorder   . Nonspecific abnormal electrocardiogram (ECG) (EKG)   . Other and unspecified disc disorder of cervical region   . Diaphragmatic hernia without mention of obstruction or gangrene   . Tobacco use disorder   . Barrett's esophagus   . Personal history of colonic polyps   . Allergic rhinitis, cause unspecified   . Insomnia, unspecified   . Unspecified vitamin D deficiency   . Other abnormal glucose   . Pre-operative cardiovascular examination   . Anemia   . Allergy   . Osteoporosis   . Fibromyalgia     Her Past Surgical History Is Significant For: Past Surgical History  Procedure Laterality Date  . Cesarean section      x2  1984 and 1986  . Pilonidal cyst resection  1992  . Cholecystectomy    . Carpal tunnel release - bilateral    . Dilation and curettage of uterus      SAB  . Tubel ligation  2002  . Breast biopsy  2005     R   Benign  . Fusion of cervical spine  2007    C5,C6,C7, and T1  . Laprascopy  2009    for RLQ pain:R oophorectomy performed no improvement in pain.   . Vaginal hysterectomy  2009    fibroid/endometriosis, one L ovary remaining  . R shoulder surgery  06/24/09    2 tears in rotator cuff,bone spurs. Dr. Modena Morrow  . 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 Spine surgery  09/2011    Cervical spine  fusion  . Back surgery    . Tubal ligation    . Colonoscopy  03/17/2012    [facility]  . Esophagogastroduodenoscopy  03/17/2012    no Barrett's esophagus. [facility].  Marland Kitchen Abdominal hysterectomy      fibroids and endometriosis.  ovarian resection R.    Her Family History Is Significant For: Family History  Problem Relation Age of Onset  . Bipolar disorder Mother   . Cancer Mother     bladder  . Hypertension Mother   . Colon polyps Mother   . Osteoarthritis Mother   . Diabetes Mother   . Thyroid disease Mother   . Heart disease Father 29    CAD/AMI with stenting  . Hyperlipidemia Father   .  Cancer Father     skin  . Angina Father   . Asthma Daughter   . Cancer Maternal Grandmother     colon  . Heart disease Maternal Grandmother   . Cancer Maternal Grandfather     lung  . Parkinson's disease Maternal Grandfather   . Stroke Maternal Grandfather   . Diabetes Maternal Grandfather   . Hyperlipidemia Maternal Grandfather   . Rheum arthritis Paternal Grandmother   . Heart attack Paternal Grandfather     Her Social History Is Significant For: History   Social History  . Marital Status: Married    Spouse Name: N/A  . Number of Children: 2  . Years of Education: High Schol   Occupational History  . Disabled    Social History Main Topics  . Smoking status: Former Research scientist (life sciences)  . Smokeless tobacco: Former Systems developer    Quit date: 11/19/2013     Comment: smoked 1 ppd 30-35 years; quit in 2011  . Alcohol Use: 0.0 oz/week    0 Standard drinks or equivalent per week     Comment: occasional  . Drug Use: No  . Sexual Activity: Yes    Birth Control/ Protection: Surgical   Other Topics Concern  . None   Social History Narrative   Patient exercises 3-4 times per week (walking x 20 minutes 3 days a week).    Marital status:  Married x 11 years; happily married; second marriage; no abuse.      Children:  2 children(31, 29); three grandsons.      Employment:  Long term disability  through work; applying for disability in 2015.  Quit working 02/2013.      Tobacco:  1/2 ppd x 34 years.; quit smoking 11/2013.       Alcohol: rare;        Drugs: none      Exercise:  Rare       lives in [place].       Seatbelt:  100%       Guns: none   1-2 cups of coffee a day        Her Allergies Are:  Allergies  Allergen Reactions  . Duloxetine Hcl Other (See Comments)    Hypertension, mood changes  . Mirtazapine Swelling  . Serotonin Reuptake Inhibitors (Ssris) Other (See Comments)    Hypertension, mood changes  . St Johns Wort Anaphylaxis  . Bupropion Hcl   . Celebrex [Celecoxib]   . Effexor [Venlafaxine] Other (See Comments)    Moodiness, vivid dreams, poor sleep cycle.  . Lyrica [Pregabalin]   . Methylphenidate Hcl   . Modafinil   . Rabeprazole Sodium   . Rabeprazole Sodium Other (See Comments)  . Lisinopril Rash    Hypertension  :   Her Current Medications Are:  Outpatient Encounter Prescriptions as of 11/10/2014  Medication Sig  . amLODipine (NORVASC) 10 MG tablet Take 1 tablet (10 mg total) by mouth daily.  . B Complex Vitamins (B COMPLEX 50 PO) Take 50 mg by mouth daily.  . cetirizine (ZYRTEC) 10 MG tablet Take 10 mg by mouth daily.    . Cholecalciferol (VITAMIN D3) 2000 UNITS TABS Take 1 tablet by mouth daily.   Marland Kitchen dexlansoprazole (DEXILANT) 60 MG capsule Take 60 mg by mouth daily.    Marland Kitchen HYDROmorphone (DILAUDID) 2 MG tablet Take by mouth every 4 (four) hours as needed for severe pain.  . hyoscyamine (ANASPAZ) 0.125 MG TBDP Place 0.125-0.25 mg under the tongue every 6 (  six) hours as needed. For stomach cramps  . losartan (COZAAR) 100 MG tablet Take 1 tablet (100 mg total) by mouth daily.  . montelukast (SINGULAIR) 10 MG tablet TAKE 1 TABLET (10 MG TOTAL) BY MOUTH AT BEDTIME.  . Multiple Vitamin (MULTIVITAMIN) tablet Take 1 tablet by mouth daily.  . Omega-3 Fatty Acids (FISH OIL PO) Take 1,200 mg by mouth daily.  . Probiotic Product (PROBIOTIC DAILY PO) Take  by mouth.  . propranolol (INDERAL) 20 MG tablet TAKE 3 TABLETS (60 MG TOTAL) BY MOUTH 2 (TWO) TIMES DAILY.  Marland Kitchen psyllium (HYDROCIL/METAMUCIL) 95 % PACK Take 1 packet by mouth daily.  Marland Kitchen tiZANidine (ZANAFLEX) 4 MG capsule TAKE 1 CAPSULE (4 MG TOTAL) BY MOUTH NIGHTLY.  :  Review of Systems:  Out of a complete 14 point review of systems, all are reviewed and negative with the exception of these symptoms as listed below:   Review of Systems  Constitutional: Positive for fatigue.       Weight gain   HENT: Positive for trouble swallowing.        Spinning sensation   Eyes:       Blurred vision   Respiratory:       Snoring   Cardiovascular: Positive for leg swelling.  Endocrine:       Feeling hot   Musculoskeletal:       Joint pain, Aching muscles   Neurological:       Memory loss, Headache, Numbness, Weakness, Difficulty Swallowing, Passing out, Sleepiness, Snoring, Restless legs  Psychiatric/Behavioral:       Not enough sleep, Decreased energy     Objective:  Neurologic Exam  Physical Exam Physical Examination:   Filed Vitals:   11/10/14 1446  BP: 154/82  Pulse: 78  Resp: 16    General Examination: The patient is a very pleasant 54 y.o. female in no acute distress. She appears well-developed and well-nourished and well groomed.   HEENT: Normocephalic, atraumatic, pupils are equal, round and reactive to light and accommodation. Funduscopic exam is normal with sharp disc margins noted. Extraocular tracking is good without limitation to gaze excursion or nystagmus noted. Normal smooth pursuit is noted. Hearing is grossly intact. Tympanic membranes are clear bilaterally. Face is symmetric with normal facial animation and normal facial sensation. Speech is clear with no dysarthria noted. There is no hypophonia. There is no lip, neck/head, jaw or voice tremor. Neck is supple but she has decrease in range of motion.  she has unremarkable scars in the front from neck surgery.There are no  carotid bruits on auscultation. Oropharynx exam reveals: moderate mouth dryness, adequate dental hygiene and mild airway crowding, due to narrow airway entry. Mallampati is class II. Tongue protrudes centrally and palate elevates symmetrically. Tonsils are small (or absent?). She has a fairly strong gag reflex. Neck size is 18-1/8 inches.  Chest: Clear to auscultation without wheezing, rhonchi or crackles noted.  Heart: S1+S2+0, regular and normal without murmurs, rubs or gallops noted.   Abdomen: Soft, non-tender and non-distended with normal bowel sounds appreciated on auscultation.  Extremities: There is trace pitting edema in the distal lower extremities bilaterally. Pedal pulses are intact.  Skin: Warm and dry without trophic changes noted. There are no varicose veins.  Musculoskeletal: exam reveals no obvious joint deformities, tenderness or joint swelling or erythema.   Neurologically:  Mental status: The patient is awake, alert and oriented in all 4 spheres. Her immediate and remote memory, attention, language skills and fund of knowledge are appropriate.  There is no evidence of aphasia, agnosia, apraxia or anomia. Speech is clear with normal prosody and enunciation. Thought process is linear. Mood is normal and affect is normal.  Cranial nerves II - XII are as described above under HEENT exam. In addition: shoulder shrug is normal with equal shoulder height noted. Motor exam: Normal bulk, strength and tone is noted. There is no drift, tremor or rebound. Romberg is negative. Reflexes are  2+ in the upper extremities, trace in both knees and ankles. Toes are  downgoing bilaterally. Fine motor skills and coordination: intact with normal finger taps, normal hand movements, normal rapid alternating patting, normal foot taps and normal foot agility.  Cerebellar testing: No dysmetria or intention tremor on finger to nose testing. Heel to shin is  difficult for her bilaterally. There is no truncal  or gait ataxia.  Sensory exam: intact to light touch, pinprick, vibration, temperature sense  in the upper extremities but does have a patchy decrease in pinprick sensation in the right forearm. She also has some decrease in pinprick and temperature sense in the medial aspect of her right distal leg. Gait, station and balance: She stands easily. No veering to one side is noted. No leaning to one side is noted. Posture is age-appropriate and stance is narrow based. Gait shows normal stride length and normal pace. No problems turning are noted. She has difficulty with tandem walk but is able to do it for several steps.              Assessment and Plan:  In summary, Lanina Larranaga is a very pleasant 54 y.o.-year old female with an underlying complex medical history of hypertension, reflux disease, vitamin D deficiency, vertigo, anemia, allergies, arthritis, degenerative spine disease, status post cervical spine fusion, migraine headaches, smoking, depression and obesity, who reports significant daytime somnolence dating back to 1998 or so. 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 Her history is significant for sleepiness but not telltale for narcolepsy at this point. She may have a hypersomnolence disorder. She does endorse snoring and states that her previous sleep study did not show any signs of obstructive sleep apnea. She has RLS symptoms and her prior sleep study apparently did show PLMs. I had a long chat with the patient about her symptoms. At this juncture, I would like to proceed with further testing in the form of an overnight polysomnogram and next a nap testing (MSLT) to help delineate her underlying sleep disorder better. We will also try to get records from her previous neurologist and sleep doctor from [place]. She is advised to try to be off of hydromorphone and tizanidine and any other potentially sedating  medications for at least 10-14 days prior to sleep testing. I will see her back after she has had sleep studies. She is advised strongly not to drive when feeling sleepy. She has tried medications for symptomatic treatment of her sleepiness. We will await further test results and records. She has had multiple intolerances to medications and allergic reactions in the past which may limit our options.   Thank you very much for allowing me to participate in the care of this nice patient. If I can be of any further assistance to you please do not hesitate to call me at [phone].  Sincerely,   Star Age, MD, PhD

## 2014-11-11 ENCOUNTER — Telehealth: Payer: Self-pay

## 2014-11-11 NOTE — Telephone Encounter (Signed)
Spoke with pt. She takes capsules. How is she supposed to split that?

## 2014-11-11 NOTE — Telephone Encounter (Signed)
Ask patient to half the her normal dose tonight and then to take 1/4 of the original dose the next night, and then stop on the third night.

## 2014-11-11 NOTE — Telephone Encounter (Signed)
Pt is on zanaflex. Is this something she can just abruptly stop taking?

## 2014-11-11 NOTE — Telephone Encounter (Signed)
Pt of Dr. Tamala Julian states that the Dr. Berneice Gandy the sleep study we referred her too would like for her to stop taking a muscle relaxer that she has been taking for a long time. Pt would like to know if this is something that she can just stop taking or if she needs to ween off of it.

## 2014-11-30 ENCOUNTER — Ambulatory Visit (INDEPENDENT_AMBULATORY_CARE_PROVIDER_SITE_OTHER): Payer: 59 | Admitting: Neurology

## 2014-11-30 DIAGNOSIS — G4710 Hypersomnia, unspecified: Secondary | ICD-10-CM | POA: Diagnosis not present

## 2014-11-30 DIAGNOSIS — G4761 Periodic limb movement disorder: Secondary | ICD-10-CM

## 2014-11-30 DIAGNOSIS — G4733 Obstructive sleep apnea (adult) (pediatric): Secondary | ICD-10-CM

## 2014-11-30 DIAGNOSIS — R9431 Abnormal electrocardiogram [ECG] [EKG]: Secondary | ICD-10-CM

## 2014-11-30 DIAGNOSIS — G4730 Sleep apnea, unspecified: Secondary | ICD-10-CM

## 2014-11-30 DIAGNOSIS — G479 Sleep disorder, unspecified: Secondary | ICD-10-CM

## 2014-11-30 DIAGNOSIS — G4720 Circadian rhythm sleep disorder, unspecified type: Secondary | ICD-10-CM

## 2014-11-30 DIAGNOSIS — G4734 Idiopathic sleep related nonobstructive alveolar hypoventilation: Secondary | ICD-10-CM

## 2014-11-30 NOTE — Sleep Study (Signed)
Please see the scanned sleep study interpretation located in the Procedure tab within the Chart Review section. 

## 2014-12-01 ENCOUNTER — Telehealth: Payer: Self-pay | Admitting: Neurology

## 2014-12-01 NOTE — Telephone Encounter (Signed)
Patient called wanting to follow up on her sleep study test results. Please call and advise. Patient can be reached @ [phone]

## 2014-12-02 NOTE — Telephone Encounter (Signed)
Pls advise patient: I will try to read tomorrow or day after.  Looking at results of sleep study briefly, no significant obstructive sleep apnea, no findings in keeping with severe sleepiness, in fact, we did not have her stay the next day for nap testing as she did not sleep enough at night.

## 2014-12-03 NOTE — Telephone Encounter (Signed)
I spoke to patient and she is aware of below information. She is aware that I will call back with further details.

## 2014-12-07 ENCOUNTER — Telehealth: Payer: Self-pay | Admitting: Neurology

## 2014-12-07 NOTE — Telephone Encounter (Signed)
Patient seen on 11/10/14 and sleep study was done on 11/30/14.   Beverlee Nims:   Please call and notify the patient that the recent sleep study showed very mild or borderline obstructive sleep apnea. She had overall lower than normal oxygen saturations and also significant leg twitching, contributing to sleep disruption. She slept poorly, only about 2 1/2 hours with sleep disruption. She may benefit from seeing a lung doctor and it may be worth while treating her overall mild obstructive sleep apnea, to see if she sleeps better and feels better.  Please inform patient that I would like to go over the details of the study during a follow up appointment and if not already previously scheduled, arrange a followup appointment. Also, route or fax report to PCP, Dr. Reginia Forts.  Once you have spoken to patient, you can close this encounter.   Thanks,  Star Age, MD, PhD [facility])

## 2014-12-07 NOTE — Telephone Encounter (Signed)
I spoke to patient and she is aware of sleep study results. She has made appt for 5/31.

## 2014-12-15 ENCOUNTER — Encounter: Payer: Self-pay | Admitting: Neurology

## 2014-12-15 ENCOUNTER — Ambulatory Visit (INDEPENDENT_AMBULATORY_CARE_PROVIDER_SITE_OTHER): Payer: 59 | Admitting: Neurology

## 2014-12-15 VITALS — BP 132/74 | HR 62 | Resp 18 | Ht 62.5 in | Wt 210.0 lb

## 2014-12-15 DIAGNOSIS — G4761 Periodic limb movement disorder: Secondary | ICD-10-CM | POA: Diagnosis not present

## 2014-12-15 DIAGNOSIS — G4734 Idiopathic sleep related nonobstructive alveolar hypoventilation: Secondary | ICD-10-CM | POA: Diagnosis not present

## 2014-12-15 DIAGNOSIS — G2581 Restless legs syndrome: Secondary | ICD-10-CM

## 2014-12-15 DIAGNOSIS — E669 Obesity, unspecified: Secondary | ICD-10-CM | POA: Diagnosis not present

## 2014-12-15 MED ORDER — PRAMIPEXOLE DIHYDROCHLORIDE 0.125 MG PO TABS: ORAL_TABLET | ORAL | Status: DC

## 2014-12-15 NOTE — Progress Notes (Signed)
Subjective:    Patient ID: Emily Melton is a 54 y.o. female.  HPI     Interim history:   Emily Melton is a 54 year old right-handed woman with an underlying complex medical history of hypertension, reflux disease, vitamin D deficiency, vertigo, anemia, allergies, arthritis, degenerative spine disease, status post cervical spine fusion, migraine headaches, smoking, depression and obesity, who presents for follow-up consultation of her sleep disorder, after her recent sleep study. The patient is unaccompanied today. I first met her on 11/10/2014 at the request of her primary care physician, at which time the patient reported significant daytime sleepiness since the late 90s. She also reported a prior diagnosis of narcolepsy but had not had sleep study with subsequent nap testing per patient report. I did not have any previous test results available for review at the time. I invited her back for sleep study with potential next a nap testing. She had a baseline sleep study on 11/30/2014 and underwent over her test results with her in detail today. Sleep efficiency was low at 30.3% with a markedly prolonged sleep latency of 175 minutes. Wake after sleep onset was high at 166.5 minutes with severe sleep fragmentation noted. She had an elevated arousal index secondary primarily to periodic leg movements. She had an increased percentage of stage I and stage II sleep, a decreased percentage of slow-wave sleep and absence of REM sleep. Severe periodic leg movements were noted with an index of 88.9 per hour, resulting and 23.8 arousals per hour. She had rare PVCs on EKG. She had no EEG changes. Mild to moderate snoring was noted. Total AHI was 5.3 per hour. Baseline oxygen saturation was only 86%. Nadir was 83%. She did not qualify for next a MSLT due to low total sleep time during the nocturnal polysomnogram.  Today, 12/15/2014: She reports trying sinemet, but could not tolerate it for leg twitching. She has  occasional restless leg symptoms. She has never been officially diagnosed with chronic lung disease, but has a history of recurrent pneumonias. She says she was premature. She quit smoking a year ago. She had a breathing test with her primary care physician a few years ago. She was advised strongly to quit smoking. She is trying to lose weight. She has lost a little bit. Her sleep is disrupted by pain. She has right-sided sciatica. She has ongoing issues with her right shoulder. She had 3 neck surgeries and still has neck pain and lower back pain. She has to get up to use bathroom at night.  Previously:  She reports significant daytime somnolence dating back to 1998 or so. 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 At the time, she was residing in [place]. She had a sleep study overnight under the care of Dr. Manuella Ghazi from [facility] neurology in [place], [place]. She says the sleep study did not show any sleep apnea but showed signs of narcolepsy. She did not have a nap study the next day as I understand. She reports that she went into REM sleep fairly quickly that night. She was diagnosed with narcolepsy based on these test results and tried on Ritalin. It helped her stay more alert but she had an allergic reaction to it and had to stop it. She also tried Provigil which caused leg swelling and blood pressure rises. She also came off of that. She then was placed on Dexedrine up to 4 times a day but was using primarily for driving. She was working at the time and needed to  drive for at least 20 minutes each way and it helped her stay awake. She did not have side effects. She does snore but denies any gasping sensations while asleep. She moved to [place], [place] in 2002 after she got divorced from her first husband. She moved in with her sister. She then met her current husband are not married. She endorses significant daytime somnolence, denies any cataplexy, denies hypnagogic or  hypnopompic hallucinations and has not experienced any sleep paralysis. She has vivid dreams. Her husband has not noticed any breathing pauses while she is asleep. She does report nocturia once or twice. She currently does not work and does not keep a very set schedule for her sleep time and rise time. She goes to bed maybe around 10 PM and falls asleep after a few minutes. She cannot sleep on her back. She has significant neck pain and says she still has radiating pain to her right arm. She had a total of 3 neck surgeries and 2 right shoulder surgeries. She is to see pain management in the past and was treated with hydromorphone. She has had some pills left from a prescription from 2014 and uses hydromorphone very infrequently. She does take tizanidine almost every night. She was tried in the past on Lyrica and Cymbalta and other medications that did not help or caused side effects. She was also diagnosed with fibromyalgia several years ago.  Prior records from her sleep doctor and prior sleep test results are not available for review today. We will request records from Dr. Trena Platt office.   She had a brain MRI without contrast on 09/25/2014: Negative MRI of brain without contrast. In addition, personally reviewed the images through the PACS system.  She has occasional restless leg symptoms. These symptoms typically do not keep her up. She has been told that she twitches in her sleep and her sleep study apparently showed leg twitching. She has no family history of narcolepsy or OSA. Her rise time is around 7:30 to 8:30. Sometimes she stays in bed longer she does not have an appointment has to be somewhere. Her current Epworth Sleepiness Scale score is 18 out of 24 and her fatigue score is 62. Since her sleep study testing some 16 years ago she has gained weight. She is not able to exercise very much. She reports neck pain and shoulder pain on the right. She also reports low back pain and had tailbone surgery  in the past. She has a history of pilonidal cyst repair.   Her Past Medical History Is Significant For: Past Medical History  Diagnosis Date  . Depression   . Narcolepsy     w/o cataplexy  . GERD (gastroesophageal reflux disease)   . HTN (hypertension)   . 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 Chicken pox     childhood  . Migraine, unspecified, without mention of intractable migraine without mention of status migrainosus   . Periodic limb movement disorder   . Nonspecific abnormal electrocardiogram (ECG) (EKG)   . Other and unspecified disc disorder of cervical region   . Diaphragmatic hernia without mention of obstruction or gangrene   . Tobacco use disorder   . Barrett's esophagus   . Personal history of colonic polyps   . Allergic rhinitis, cause unspecified   . Insomnia, unspecified   . Unspecified vitamin D deficiency   . Other abnormal glucose   . Pre-operative  cardiovascular examination   . Anemia   . Allergy   . Osteoporosis   . Fibromyalgia     Her Past Surgical History Is Significant For: Past Surgical History  Procedure Laterality Date  . Cesarean section      x2  1984 and 1986  . Pilonidal cyst resection  1992  . Cholecystectomy    . Carpal tunnel release - bilateral    . Dilation and curettage of uterus      SAB  . Tubel ligation  2002  . Breast biopsy  2005     R   Benign  . Fusion of cervical spine  2007    C5,C6,C7, and T1  . Laprascopy  2009    for RLQ pain:R oophorectomy performed no improvement in pain.   . Vaginal hysterectomy  2009    fibroid/endometriosis, one L ovary remaining  . R shoulder surgery  06/24/09    2 tears in rotator cuff,bone spurs. Dr. Modena Morrow  . 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 Spine surgery  09/2011    Cervical spine fusion  . Back surgery    . Tubal ligation    . Colonoscopy  03/17/2012    [facility]  . Esophagogastroduodenoscopy  03/17/2012    no  Barrett's esophagus. [facility].  Marland Kitchen Abdominal hysterectomy      fibroids and endometriosis.  ovarian resection R.    Her Family History Is Significant For: Family History  Problem Relation Age of Onset  . Bipolar disorder Mother   . Cancer Mother     bladder  . Hypertension Mother   . Colon polyps Mother   . Osteoarthritis Mother   . Diabetes Mother   . Thyroid disease Mother   . Heart disease Father 64    CAD/AMI with stenting  . Hyperlipidemia Father   . Cancer Father     skin  . Angina Father   . Asthma Daughter   . Cancer Maternal Grandmother     colon  . Heart disease Maternal Grandmother   . Cancer Maternal Grandfather     lung  . Parkinson's disease Maternal Grandfather   . Stroke Maternal Grandfather   . Diabetes Maternal Grandfather   . Hyperlipidemia Maternal Grandfather   . Rheum arthritis Paternal Grandmother   . Heart attack Paternal Grandfather     Her Social History Is Significant For: History   Social History  . Marital Status: Married    Spouse Name: N/A  . Number of Children: 2  . Years of Education: High Schol   Occupational History  . Disabled    Social History Main Topics  . Smoking status: Former Research scientist (life sciences)  . Smokeless tobacco: Former Systems developer    Quit date: 11/19/2013     Comment: smoked 1 ppd 30-35 years; quit in 2011  . Alcohol Use: 0.0 oz/week    0 Standard drinks or equivalent per week     Comment: occasional  . Drug Use: No  . Sexual Activity: Yes    Birth Control/ Protection: Surgical   Other Topics Concern  . None   Social History Narrative   Patient exercises 3-4 times per week (walking x 20 minutes 3 days a week).    Marital status:  Married x 11 years; happily married; second marriage; no abuse.      Children:  2 children(31, 29); three grandsons.      Employment:  Long term disability through work; applying for disability in 2015.  Quit working 02/2013.      Tobacco:  1/2 ppd x 34 years.; quit smoking 11/2013.        Alcohol: rare;        Drugs: none      Exercise:  Rare       lives in [place].       Seatbelt:  100%       Guns: none   1-2 cups of coffee a day/ rare occasional Pepsi        Her Allergies Are:  Allergies  Allergen Reactions  . Duloxetine Hcl Other (See Comments)    Hypertension, mood changes  . Mirtazapine Swelling  . Serotonin Reuptake Inhibitors (Ssris) Other (See Comments)    Hypertension, mood changes  . St Johns Wort Anaphylaxis  . Bupropion Hcl   . Celebrex [Celecoxib]   . Effexor [Venlafaxine] Other (See Comments)    Moodiness, vivid dreams, poor sleep cycle.  . Lyrica [Pregabalin]   . Methylphenidate Hcl   . Modafinil   . Rabeprazole Sodium   . Rabeprazole Sodium Other (See Comments)  . Lisinopril Rash    Hypertension  :   Her Current Medications Are:  Outpatient Encounter Prescriptions as of 12/15/2014  Medication Sig  . amLODipine (NORVASC) 10 MG tablet Take 1 tablet (10 mg total) by mouth daily.  . B Complex Vitamins (B COMPLEX 50 PO) Take 50 mg by mouth daily.  . cetirizine (ZYRTEC) 10 MG tablet Take 10 mg by mouth daily.    . Cholecalciferol (VITAMIN D3) 2000 UNITS TABS Take 1 tablet by mouth daily.   Marland Kitchen dexlansoprazole (DEXILANT) 60 MG capsule Take 60 mg by mouth daily.    Marland Kitchen HYDROmorphone (DILAUDID) 2 MG tablet Take by mouth every 4 (four) hours as needed for severe pain.  . hyoscyamine (ANASPAZ) 0.125 MG TBDP Place 0.125-0.25 mg under the tongue every 6 (six) hours as needed. For stomach cramps  . losartan (COZAAR) 100 MG tablet Take 1 tablet (100 mg total) by mouth daily.  . montelukast (SINGULAIR) 10 MG tablet TAKE 1 TABLET (10 MG TOTAL) BY MOUTH AT BEDTIME.  . Multiple Vitamin (MULTIVITAMIN) tablet Take 1 tablet by mouth daily.  . Omega-3 Fatty Acids (FISH OIL PO) Take 1,200 mg by mouth daily.  . Probiotic Product (PROBIOTIC DAILY PO) Take by mouth.  . propranolol (INDERAL) 20 MG tablet TAKE 3 TABLETS (60 MG TOTAL) BY MOUTH 2 (TWO) TIMES DAILY.  Marland Kitchen  psyllium (HYDROCIL/METAMUCIL) 95 % PACK Take 1 packet by mouth daily.  Marland Kitchen tiZANidine (ZANAFLEX) 4 MG capsule TAKE 1 CAPSULE (4 MG TOTAL) BY MOUTH NIGHTLY.   No facility-administered encounter medications on file as of 12/15/2014.  :  Review of Systems:  Out of a complete 14 point review of systems, all are reviewed and negative with the exception of these symptoms as listed below:    Review of Systems  Constitutional: Positive for fatigue.  Musculoskeletal:       Sciatic pain into R leg    Objective:  Neurologic Exam  Physical Exam Physical Examination:   Filed Vitals:   12/15/14 1352  BP: 132/74  Pulse: 62  Resp: 18    General Examination: The patient is a very pleasant 54 y.o. female in no acute distress. She appears well-developed and well-nourished and well groomed.   HEENT: Normocephalic, atraumatic, pupils are equal, round and reactive to light and accommodation. Funduscopic exam is normal with sharp disc margins noted. Extraocular tracking is good without limitation to gaze  excursion or nystagmus noted. Normal smooth pursuit is noted. Hearing is grossly intact. Tympanic membranes are clear bilaterally. Face is symmetric with normal facial animation and normal facial sensation. Speech is clear with no dysarthria noted. There is no hypophonia. There is no lip, neck/head, jaw or voice tremor. Neck is supple but she has decrease in range of motion.  she has unremarkable scars in the front from neck surgery.There are no carotid bruits on auscultation. Oropharynx exam reveals: moderate mouth dryness, adequate dental hygiene and mild airway crowding, due to narrow airway entry. Mallampati is class II. Tongue protrudes centrally and palate elevates symmetrically.   Chest: Clear to auscultation without wheezing, rhonchi or crackles noted.  Heart: S1+S2+0, regular and normal without murmurs, rubs or gallops noted.   Abdomen: Soft, non-tender and non-distended with normal bowel sounds  appreciated on auscultation.  Extremities: There is trace pitting edema in the distal lower extremities bilaterally. Pedal pulses are intact.  Skin: Warm and dry without trophic changes noted. There are no varicose veins.  Musculoskeletal: exam reveals no obvious joint deformities, tenderness or joint swelling or erythema, with the exception of decrease in range of motion in her right shoulder and right shoulder pain. She also has decrease in range of motion in her neck..   Neurologically:  Mental status: The patient is awake, alert and oriented in all 4 spheres. Her immediate and remote memory, attention, language skills and fund of knowledge are appropriate. There is no evidence of aphasia, agnosia, apraxia or anomia. Speech is clear with normal prosody and enunciation. Thought process is linear. Mood is normal and affect is normal.  Cranial nerves II - XII are as described above under HEENT exam. In addition: shoulder shrug is normal with equal shoulder height noted. Motor exam: Normal bulk, strength and tone is noted. There is no drift, tremor or rebound. Romberg is negative. Reflexes are  2+ in the upper extremities, trace in both knees and ankles. Toes are  downgoing bilaterally. Fine motor skills and coordination: intact with normal finger taps, normal hand movements, normal rapid alternating patting, normal foot taps and normal foot agility.  Cerebellar testing: No dysmetria or intention tremor on finger to nose testing. Heel to shin is  difficult for her bilaterally. There is no truncal or gait ataxia.  Sensory exam: intact to light touch, pinprick, vibration, temperature sense  in the upper extremities but does have a patchy decrease in pinprick sensation in the right forearm, unchanged. She also has some decrease in pinprick and temperature sense in the medial aspect of her right distal leg. Gait, station and balance: She stands easily. No veering to one side is noted. No leaning to one side  is noted. Posture is age-appropriate and stance is narrow based. Gait shows normal stride length and normal pace. No problems turning are noted. She has improved tandem walk today.   Assessment and Plan:  In summary, Emily Melton is a very pleasant 54 year old female with an underlying complex medical history of hypertension, reflux disease, vitamin D deficiency, vertigo, anemia, allergies, arthritis, degenerative spine disease, status post cervical spine fusion, migraine headaches, smoking, depression and obesity, who reports significant daytime somnolence dating back to 1998 or so. 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 Her she had a recent sleep study and went over her test results with her in detail. 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 Her most recent sleep study showed poor  sleep efficiency, poor sleep consolidation, absence of REM sleep, no telltale findings for narcolepsy, she did not qualify for next a nap testing. She had severe periodic leg movements with arousals, likely contributing to sleep disruption. She had low average oxygen saturation since sleep. This is most likely due to a combination of having had multiple pneumonia bouts in the past, prior smoking with recent cessation, and obesity as well as taking potentially sedating medications but she is only taking Dilaudid as needed. She is advised to pursue weight loss. She is advised to discuss with her primary care physician repeat pulmonary function testing and a potential referral to her pulmonologist. She would like to discuss this first with her primary care physician. She is advised to try Mirapex for periodic leg movements and milder restless leg symptoms. She has significant PLM associated arousals. I talked her about trying Mirapex, its potential side effects and benefits. We will titrate slowly. She does not have the greatest track records with  medications in general. Her neurological exam is largely nonfocal. She is reassured in that regard. Her exam is overall stable from last time.  She is advised strongly not to drive when feeling sleepy. She has tried medications for symptomatic treatment of her sleepiness. I will see her back in about 3 months, sooner if the need arises. I provided her with a new prescription for generic Mirapex, 0.125 mg strength with slow titration to up to 0.375 mg each night. I answered all her questions today and the patient was in agreement. I spent 20 minutes in total face-to-face time with the patient, more than 50% of which was spent in counseling and coordination of care, reviewing test results, reviewing medication and discussing or reviewing the diagnosis of PLMD, nocturnal hypoxemia, the prognosis and treatment options.

## 2014-12-15 NOTE — Patient Instructions (Signed)
We will try a new medication for RLS and leg twitching: Mirapex (generic name: pramipexole) 0.125 mg: Take 1 pill each night for 1 week, the 2 pills each night for 1 week, then 3 pills each night thereafter. Common side effects reported are: Sedation, sleepiness, nausea, vomiting, and rare side effects are confusion, hallucinations, swelling in legs, and abnormal behaviors, including impulse control problems, which can manifest as excessive eating, obsessions with food or gambling, or hypersexuality.  We will consider a referral to pulmonology. Talk to Dr. Tamala Julian about a referral to a lung doctor. In the meantime, try to lose weight, sleep a little bit more elevated.   I will see you back in about 3 months.   Your sleep study did not suggest findings in keeping with narcolepsy.

## 2014-12-16 ENCOUNTER — Ambulatory Visit: Payer: 59 | Admitting: Neurology

## 2015-03-17 ENCOUNTER — Ambulatory Visit: Payer: 59 | Admitting: Neurology

## 2015-03-23 ENCOUNTER — Telehealth: Payer: Self-pay

## 2015-03-23 DIAGNOSIS — I10 Essential (primary) hypertension: Secondary | ICD-10-CM

## 2015-03-23 MED ORDER — LOSARTAN POTASSIUM 100 MG PO TABS: 100.0000 mg | ORAL_TABLET | Freq: Every day | ORAL | Status: DC

## 2015-03-23 NOTE — Telephone Encounter (Signed)
Pt is needing to refill her  losartin 100 mg  And have it sent to [facility] to [address] in [place] she has an appt with dr Tamala Julian on 21st but will run out before then on this mediation  Pt has had to change insurane and pharmacies

## 2015-03-23 NOTE — Telephone Encounter (Signed)
Rx sent to [facility]

## 2015-03-24 NOTE — Telephone Encounter (Signed)
Pt.notified

## 2015-04-05 ENCOUNTER — Encounter: Payer: Self-pay | Admitting: Family Medicine

## 2015-04-05 ENCOUNTER — Ambulatory Visit (INDEPENDENT_AMBULATORY_CARE_PROVIDER_SITE_OTHER): Payer: 59 | Admitting: Family Medicine

## 2015-04-05 VITALS — BP 152/84 | HR 69 | Temp 97.9°F | Resp 16 | Ht 62.0 in | Wt 203.2 lb

## 2015-04-05 DIAGNOSIS — R7302 Impaired glucose tolerance (oral): Secondary | ICD-10-CM

## 2015-04-05 DIAGNOSIS — M5030 Other cervical disc degeneration, unspecified cervical region: Secondary | ICD-10-CM | POA: Diagnosis not present

## 2015-04-05 DIAGNOSIS — E7800 Pure hypercholesterolemia, unspecified: Secondary | ICD-10-CM

## 2015-04-05 DIAGNOSIS — I10 Essential (primary) hypertension: Secondary | ICD-10-CM

## 2015-04-05 DIAGNOSIS — Z23 Encounter for immunization: Secondary | ICD-10-CM

## 2015-04-05 DIAGNOSIS — G2581 Restless legs syndrome: Secondary | ICD-10-CM | POA: Diagnosis not present

## 2015-04-05 DIAGNOSIS — R9431 Abnormal electrocardiogram [ECG] [EKG]: Secondary | ICD-10-CM | POA: Diagnosis not present

## 2015-04-05 DIAGNOSIS — Z0000 Encounter for general adult medical examination without abnormal findings: Secondary | ICD-10-CM

## 2015-04-05 DIAGNOSIS — G4734 Idiopathic sleep related nonobstructive alveolar hypoventilation: Secondary | ICD-10-CM

## 2015-04-05 LAB — POCT URINALYSIS DIPSTICK
Bilirubin, UA: NEGATIVE
Blood, UA: NEGATIVE
Glucose, UA: NEGATIVE
Ketones, UA: NEGATIVE
Leukocytes, UA: NEGATIVE
Nitrite, UA: NEGATIVE
Protein, UA: 30
Spec Grav, UA: 1.025
Urobilinogen, UA: 0.2
pH, UA: 5.5

## 2015-04-05 LAB — CBC WITH DIFFERENTIAL/PLATELET
Basophils Absolute: 0.1 10*3/uL (ref 0.0–0.1)
Basophils Relative: 1 % (ref 0–1)
Eosinophils Absolute: 0.1 10*3/uL (ref 0.0–0.7)
Eosinophils Relative: 2 % (ref 0–5)
HCT: 37.9 % (ref 36.0–46.0)
Hemoglobin: 12.8 g/dL (ref 12.0–15.0)
Lymphocytes Relative: 31 % (ref 12–46)
Lymphs Abs: 1.8 10*3/uL (ref 0.7–4.0)
MCH: 29.8 pg (ref 26.0–34.0)
MCHC: 33.8 g/dL (ref 30.0–36.0)
MCV: 88.3 fL (ref 78.0–100.0)
MPV: 11.2 fL (ref 8.6–12.4)
Monocytes Absolute: 0.3 10*3/uL (ref 0.1–1.0)
Monocytes Relative: 5 % (ref 3–12)
Neutro Abs: 3.5 10*3/uL (ref 1.7–7.7)
Neutrophils Relative %: 61 % (ref 43–77)
Platelets: 192 10*3/uL (ref 150–400)
RBC: 4.29 MIL/uL (ref 3.87–5.11)
RDW: 14 % (ref 11.5–15.5)
WBC: 5.8 10*3/uL (ref 4.0–10.5)

## 2015-04-05 LAB — COMPREHENSIVE METABOLIC PANEL
ALT: 38 U/L — ABNORMAL HIGH (ref 6–29)
AST: 29 U/L (ref 10–35)
Albumin: 4.4 g/dL (ref 3.6–5.1)
Alkaline Phosphatase: 72 U/L (ref 33–130)
BUN: 15 mg/dL (ref 7–25)
CO2: 28 mmol/L (ref 20–31)
Calcium: 10.1 mg/dL (ref 8.6–10.4)
Chloride: 104 mmol/L (ref 98–110)
Creat: 0.7 mg/dL (ref 0.50–1.05)
Glucose, Bld: 106 mg/dL — ABNORMAL HIGH (ref 65–99)
Potassium: 4.3 mmol/L (ref 3.5–5.3)
Sodium: 139 mmol/L (ref 135–146)
Total Bilirubin: 0.7 mg/dL (ref 0.2–1.2)
Total Protein: 6.9 g/dL (ref 6.1–8.1)

## 2015-04-05 LAB — LIPID PANEL
Cholesterol: 226 mg/dL — ABNORMAL HIGH (ref 125–200)
HDL: 60 mg/dL (ref >=46)
LDL Cholesterol: 144 mg/dL — ABNORMAL HIGH (ref <130)
Total CHOL/HDL Ratio: 3.8 Ratio (ref <=5.0)
Triglycerides: 110 mg/dL (ref <150)
VLDL: 22 mg/dL (ref <30)

## 2015-04-05 LAB — TSH: TSH: 1.595 u[IU]/mL (ref 0.350–4.500)

## 2015-04-05 MED ORDER — HYDROCODONE-ACETAMINOPHEN 5-325 MG PO TABS: 1.0000 | ORAL_TABLET | Freq: Four times a day (QID) | ORAL | Status: DC | PRN

## 2015-04-05 NOTE — Patient Instructions (Signed)
Keeping You Healthy  Get These Tests  Blood Pressure- Have your blood pressure checked by your healthcare provider at least once a year.  Normal blood pressure is 120/80.  Weight- Have your body mass index (BMI) calculated to screen for obesity.  BMI is a measure of body fat based on height and weight.  You can calculate your own BMI at [employer].it  Cholesterol- Have your cholesterol checked every year.  Diabetes- Have your blood sugar checked every year if you have high blood pressure, high cholesterol, a family history of diabetes or if you are overweight.  Pap Test - Have a pap test every 1 to 5 years if you have been sexually active.  If you are older than 65 and recent pap tests have been normal you may not need additional pap tests.  In addition, if you have had a hysterectomy  for benign disease additional pap tests are not necessary.  Mammogram-Yearly mammograms are essential for early detection of breast cancer  Screening for Colon Cancer- Colonoscopy starting at age 63. Screening may begin sooner depending on your family history and other health conditions.  Follow up colonoscopy as directed by your Gastroenterologist.  Screening for Osteoporosis- Screening begins at age 73 with bone density scanning, sooner if you are at higher risk for developing Osteoporosis.  Get these medicines  Calcium with Vitamin D- Your body requires 1200-1500 mg of Calcium a day and [phone] IU of Vitamin D a day.  You can only absorb 500 mg of Calcium at a time therefore Calcium must be taken in 2 or 3 separate doses throughout the day.  Hormones- Hormone therapy has been associated with increased risk for certain cancers and heart disease.  Talk to your healthcare provider about if you need relief from menopausal symptoms.  Aspirin- Ask your healthcare provider about taking Aspirin to prevent Heart Disease and Stroke.  Get these Immuniztions  Flu shot- Every fall  Pneumonia shot-  Once after the age of 51; if you are younger ask your healthcare provider if you need a pneumonia shot.  Tetanus- Every ten years.  Zostavax- Once after the age of 78 to prevent shingles.  Take these steps  Don't smoke- Your healthcare provider can help you quit. For tips on how to quit, ask your healthcare provider or go to www.smokefree.gov or call 1-800 QUIT-NOW.  Be physically active- Exercise 5 days a week for a minimum of 30 minutes.  If you are not already physically active, start slow and gradually work up to 30 minutes of moderate physical activity.  Try walking, dancing, bike riding, swimming, etc.  Eat a healthy diet- Eat a variety of healthy foods such as fruits, vegetables, whole grains, low fat milk, low fat cheeses, yogurt, lean meats, chicken, fish, eggs, dried beans, tofu, etc.  For more information go to www.thenutritionsource.org  Dental visit- Brush and floss teeth twice daily; visit your dentist twice a year.  Eye exam- Visit your Optometrist or Ophthalmologist yearly.  Drink alcohol in moderation- Limit alcohol intake to one drink or less a day.  Never drink and drive.  Depression- Your emotional health is as important as your physical health.  If you're feeling down or losing interest in things you normally enjoy, please talk to your healthcare provider.  Seat Belts- can save your life; always wear one  Smoke/Carbon Monoxide detectors- These detectors need to be installed on the appropriate level of your home.  Replace batteries at least once a year.  Violence- If  anyone is threatening or hurting you, please tell your healthcare provider.  Living Will/ Health care power of attorney- Discuss with your healthcare provider and family.

## 2015-04-05 NOTE — Progress Notes (Signed)
Subjective:    Patient ID: Emily Melton, female    DOB: [birth year], 54 y.o.   MRN: [id number]  04/05/2015  Annual Exam and Medication Refill   HPI This 54 y.o. female presents for Complete Physical Examination.  Last physical:  02-04-2014 Pap smear:  07-15-2009; fibroids and endometriosis; one ovary resected.  Mammogram:  08-14-2013 Colonoscopy:  12-22-2011; repeat in 5 years. Bone density: never TDAP:  2012 Pneumovax:  2013 Zostavax: never Influenza:  2015; received Eye exam:  2015; +glasses.  Scheduled in 05/2015.  RLS/periodic leg movement: Mirapex is out; took for three weeks; suffered with nausea, moodiness, dry mouth.  Has not tried Requip.    Husband got terminated from [facility] in June 2016; then got [employer] in 02/2015.  Now, husband started working at [employer] at [employer].  In November, can get on [employer].    HTN:  Home BP running 110-139/65-84.    Nocturnal hypoxia:  S/p sleep study; horrible experience.  Suffered with horrible myalgias and arthralgias; sciatica acted up; unable to take Dilaudid for ten days.    S/p removal of all metal fillings over the summer.  90 day mail order with [facility].  Review of Systems  Constitutional: Negative for fever, chills, diaphoresis, activity change, appetite change, fatigue and unexpected weight change.  HENT: Negative for congestion, dental problem, drooling, ear discharge, ear pain, facial swelling, hearing loss, mouth sores, nosebleeds, postnasal drip, rhinorrhea, sinus pressure, sneezing, sore throat, tinnitus, trouble swallowing and voice change.   Eyes: Negative for photophobia, pain, discharge, redness, itching and visual disturbance.  Respiratory: Negative for apnea, cough, choking, chest tightness, shortness of breath, wheezing and stridor.   Cardiovascular: Negative for chest pain, palpitations and leg swelling.  Gastrointestinal: Positive for abdominal pain. Negative for nausea, vomiting, diarrhea,  constipation, blood in stool, abdominal distention, anal bleeding and rectal pain.  Endocrine: Negative for cold intolerance, heat intolerance, polydipsia, polyphagia and polyuria.  Genitourinary: Negative for dysuria, urgency, frequency, hematuria, flank pain, decreased urine volume, vaginal bleeding, vaginal discharge, enuresis, difficulty urinating, genital sores, vaginal pain, menstrual problem, pelvic pain and dyspareunia.  Musculoskeletal: Positive for myalgias, back pain, arthralgias and neck pain. Negative for joint swelling, gait problem and neck stiffness.  Skin: Negative for color change, pallor, rash and wound.  Allergic/Immunologic: Negative for environmental allergies, food allergies and immunocompromised state.  Neurological: Negative for dizziness, tremors, seizures, syncope, facial asymmetry, speech difficulty, weakness, light-headedness, numbness and headaches.  Hematological: Negative for adenopathy. Does not bruise/bleed easily.  Psychiatric/Behavioral: Positive for dysphoric mood. Negative for suicidal ideas, hallucinations, behavioral problems, confusion, sleep disturbance, self-injury, decreased concentration and agitation. The patient is nervous/anxious. The patient is not hyperactive.     Past Medical History  Diagnosis Date  . Depression   . Narcolepsy     w/o cataplexy  . GERD (gastroesophageal reflux disease)   . HTN (hypertension)   . 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 Chicken pox     childhood  . Migraine, unspecified, without mention of intractable migraine without mention of status migrainosus   . Periodic limb movement disorder   . Nonspecific abnormal electrocardiogram (ECG) (EKG)   . Diaphragmatic hernia without mention of obstruction or gangrene   . Tobacco use disorder   . Barrett's esophagus   . Personal history of colonic polyps   . Allergic rhinitis, cause unspecified   . Insomnia, unspecified    .  Unspecified vitamin D deficiency   . Other abnormal glucose   . Anemia   . Allergy   . Osteoporosis   . Fibromyalgia    Past Surgical History  Procedure Laterality Date  . Cesarean section      x2  1984 and 1986  . Pilonidal cyst resection  1992  . Cholecystectomy    . Carpal tunnel release - bilateral    . Dilation and curettage of uterus      SAB  . Tubel ligation  2002  . Breast biopsy  2005     R   Benign  . Fusion of cervical spine  2007    C5,C6,C7, and T1  . Laprascopy  2009    for RLQ pain:R oophorectomy performed no improvement in pain.   . Vaginal hysterectomy  2009    fibroid/endometriosis, one L ovary remaining  . R shoulder surgery  06/24/09    2 tears in rotator cuff,bone spurs. Dr. Modena Morrow  . 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 Spine surgery  09/2011    Cervical spine fusion  . Back surgery    . Tubal ligation    . Colonoscopy  03/17/2012    [facility]  . Esophagogastroduodenoscopy  03/17/2012    no Barrett's esophagus. [facility].  Marland Kitchen Abdominal hysterectomy      fibroids and endometriosis.  ovarian resection R.   Allergies  Allergen Reactions  . Duloxetine Hcl Other (See Comments)    Hypertension, mood changes  . Mirtazapine Swelling  . Serotonin Reuptake Inhibitors (Ssris) Other (See Comments)    Hypertension, mood changes  . St Johns Wort Anaphylaxis  . Bupropion Hcl   . Celebrex [Celecoxib]   . Effexor [Venlafaxine] Other (See Comments)    Moodiness, vivid dreams, poor sleep cycle.  . Lyrica [Pregabalin]   . Methylphenidate Hcl   . Modafinil   . Rabeprazole Sodium   . Rabeprazole Sodium Other (See Comments)  . Lisinopril Rash    Hypertension   Current Outpatient Prescriptions  Medication Sig Dispense Refill  . cetirizine (ZYRTEC) 10 MG tablet Take 10 mg by mouth daily.      . Cholecalciferol (VITAMIN D3) 2000 UNITS TABS Take 1 tablet by mouth daily.     Marland Kitchen dexlansoprazole (DEXILANT) 60 MG capsule Take 60 mg by mouth daily.       Marland Kitchen HYDROmorphone (DILAUDID) 2 MG tablet Take by mouth every 4 (four) hours as needed for severe pain.    . hyoscyamine (ANASPAZ) 0.125 MG TBDP Place 0.125-0.25 mg under the tongue every 6 (six) hours as needed. For stomach cramps    . losartan (COZAAR) 100 MG tablet Take 1 tablet (100 mg total) by mouth daily. 90 tablet 3  . montelukast (SINGULAIR) 10 MG tablet TAKE 1 TABLET (10 MG TOTAL) BY MOUTH AT BEDTIME. 90 tablet 3  . Multiple Vitamin (MULTIVITAMIN) tablet Take 1 tablet by mouth daily.    . Omega-3 Fatty Acids (FISH OIL PO) Take 1,200 mg by mouth daily.    . Probiotic Product (PROBIOTIC DAILY PO) Take by mouth.    . psyllium (HYDROCIL/METAMUCIL) 95 % PACK Take 1 packet by mouth daily.    Marland Kitchen tiZANidine (ZANAFLEX) 4 MG capsule TAKE 1 CAPSULE (4 MG TOTAL) BY MOUTH NIGHTLY. 90 capsule 1  . amLODipine (NORVASC) 10 MG tablet Take 1 tablet (10 mg total) by mouth daily. 90 tablet 2  . B Complex Vitamins (B COMPLEX 50 PO) Take 50 mg by  mouth daily.    Marland Kitchen HYDROcodone-acetaminophen (NORCO/VICODIN) 5-325 MG per tablet Take 1 tablet by mouth every 6 (six) hours as needed for moderate pain. 30 tablet 0  . pramipexole (MIRAPEX) 0.125 MG tablet 1 pill each night x 1 week, then 2 pills each night x 1 week, then 3 pills each night thereafter. Take 90-120 minutes before bedtime. (Patient not taking: Reported on 04/05/2015) 90 tablet 5  . propranolol (INDERAL) 20 MG tablet take 3 tablets by mouth twice a day 180 tablet 5   No current facility-administered medications for this visit.   Social History   Social History  . Marital Status: Married    Spouse Name: N/A  . Number of Children: 2  . Years of Education: High Schol   Occupational History  . Disabled    Social History Main Topics  . Smoking status: Former Research scientist (life sciences)  . Smokeless tobacco: Former Systems developer    Quit date: 11/19/2013     Comment: smoked 1 ppd 30-35 years; quit in 2011  . Alcohol Use: 0.0 oz/week    0 Standard drinks or equivalent per week       Comment: occasional  . Drug Use: Yes    Special: Flunitrazepam  . Sexual Activity: Yes    Birth Control/ Protection: Surgical   Other Topics Concern  . Not on file   Social History Narrative   Patient exercises 3-4 times per week (walking x 20 minutes 3 days a week).    Marital status:  Married x 11 years; happily married; second marriage; no abuse.      Children:  2 children(31, 29); three grandsons.      Employment:  Long term disability through work; applying for disability in 2015.  Quit working 02/2013.      Tobacco:  1/2 ppd x 34 years.; quit smoking 11/2013.       Alcohol: rare;        Drugs: none      Exercise:  Rare       lives in [place].       Seatbelt:  100%       Guns: none   1-2 cups of coffee a day/ rare occasional Pepsi       Family History  Problem Relation Age of Onset  . Bipolar disorder Mother   . Cancer Mother     bladder  . Hypertension Mother   . Colon polyps Mother   . Osteoarthritis Mother   . Diabetes Mother   . Thyroid disease Mother   . Heart disease Father 73    CAD/AMI with stenting/PAD.  Marland Kitchen Hyperlipidemia Father   . Cancer Father     skin  . Angina Father   . Asthma Daughter   . Cancer Maternal Grandmother     colon  . Heart disease Maternal Grandmother   . Cancer Maternal Grandfather     lung  . Parkinson's disease Maternal Grandfather   . Stroke Maternal Grandfather   . Diabetes Maternal Grandfather   . Hyperlipidemia Maternal Grandfather   . Rheum arthritis Paternal Grandmother   . Heart attack Paternal Grandfather        Objective:    BP 152/84 mmHg  Pulse 69  Temp(Src) 97.9 F (36.6 C) (Oral)  Resp 16  Ht 5' 2"  (1.575 m)  Wt 203 lb 3.2 oz (92.171 kg)  BMI 37.16 kg/m2 Physical Exam  Constitutional: She is oriented to person, place, and time. She appears well-developed and well-nourished. No distress.  HENT:  Head: Normocephalic and atraumatic.  Right Ear: External ear normal.  Left Ear: External ear  normal.  Nose: Nose normal.  Mouth/Throat: Oropharynx is clear and moist.  Eyes: Conjunctivae and EOM are normal. Pupils are equal, round, and reactive to light.  Neck: Normal range of motion and full passive range of motion without pain. Neck supple. No JVD present. Carotid bruit is not present. No thyromegaly present.  Cardiovascular: Normal rate, regular rhythm and normal heart sounds.  Exam reveals no gallop and no friction rub.   No murmur heard. Pulmonary/Chest: Effort normal and breath sounds normal. She has no wheezes. She has no rales. Right breast exhibits no inverted nipple, no mass, no nipple discharge, no skin change and no tenderness. Left breast exhibits no inverted nipple, no mass, no nipple discharge, no skin change and no tenderness. Breasts are symmetrical.  Abdominal: Soft. Bowel sounds are normal. She exhibits no distension and no mass. There is no tenderness. There is no rebound and no guarding.  Genitourinary: Vagina normal. There is no rash, tenderness or lesion on the right labia. There is no rash, tenderness or lesion on the left labia. Right adnexum displays no mass, no tenderness and no fullness. Left adnexum displays no mass, no tenderness and no fullness.  Musculoskeletal:       Right shoulder: Normal.       Left shoulder: Normal.       Cervical back: Normal.  Lymphadenopathy:    She has no cervical adenopathy.  Neurological: She is alert and oriented to person, place, and time. She has normal reflexes. No cranial nerve deficit. She exhibits normal muscle tone. Coordination normal.  Skin: Skin is warm and dry. No rash noted. She is not diaphoretic. No erythema. No pallor.  Psychiatric: She has a normal mood and affect. Her behavior is normal. Judgment and thought content normal.  Nursing note and vitals reviewed.  Results for orders placed or performed in visit on 04/05/15  CBC with Differential/Platelet  Result Value Ref Range   WBC 5.8 4.0 - 10.5 K/uL   RBC  4.29 3.87 - 5.11 MIL/uL   Hemoglobin 12.8 12.0 - 15.0 g/dL   HCT 37.9 36.0 - 46.0 %   MCV 88.3 78.0 - 100.0 fL   MCH 29.8 26.0 - 34.0 pg   MCHC 33.8 30.0 - 36.0 g/dL   RDW 14.0 11.5 - 15.5 %   Platelets 192 150 - 400 K/uL   MPV 11.2 8.6 - 12.4 fL   Neutrophils Relative % 61 43 - 77 %   Neutro Abs 3.5 1.7 - 7.7 K/uL   Lymphocytes Relative 31 12 - 46 %   Lymphs Abs 1.8 0.7 - 4.0 K/uL   Monocytes Relative 5 3 - 12 %   Monocytes Absolute 0.3 0.1 - 1.0 K/uL   Eosinophils Relative 2 0 - 5 %   Eosinophils Absolute 0.1 0.0 - 0.7 K/uL   Basophils Relative 1 0 - 1 %   Basophils Absolute 0.1 0.0 - 0.1 K/uL   Smear Review Criteria for review not met   Comprehensive metabolic panel  Result Value Ref Range   Sodium 139 135 - 146 mmol/L   Potassium 4.3 3.5 - 5.3 mmol/L   Chloride 104 98 - 110 mmol/L   CO2 28 20 - 31 mmol/L   Glucose, Bld 106 (H) 65 - 99 mg/dL   BUN 15 7 - 25 mg/dL   Creat 0.70 0.50 - 1.05 mg/dL   Total Bilirubin  0.7 0.2 - 1.2 mg/dL   Alkaline Phosphatase 72 33 - 130 U/L   AST 29 10 - 35 U/L   ALT 38 (H) 6 - 29 U/L   Total Protein 6.9 6.1 - 8.1 g/dL   Albumin 4.4 3.6 - 5.1 g/dL   Calcium 10.1 8.6 - 10.4 mg/dL  Hemoglobin A1c  Result Value Ref Range   Hgb A1c MFr Bld 5.6 <5.7 %   Mean Plasma Glucose 114 <117 mg/dL  Lipid panel  Result Value Ref Range   Cholesterol 226 (H) 125 - 200 mg/dL   Triglycerides 110 <150 mg/dL   HDL 60 >=46 mg/dL   Total CHOL/HDL Ratio 3.8 <=5.0 Ratio   VLDL 22 <30 mg/dL   LDL Cholesterol 144 (H) <130 mg/dL  TSH  Result Value Ref Range   TSH 1.595 0.350 - 4.500 uIU/mL  Vit D  25 hydroxy (rtn osteoporosis monitoring)  Result Value Ref Range   Vit D, 25-Hydroxy 48 30 - 100 ng/mL  POCT urinalysis dipstick  Result Value Ref Range   Color, UA Yellow    Clarity, UA Clear    Glucose, UA Negative    Bilirubin, UA Negative    Ketones, UA Negative    Spec Grav, UA 1.025    Blood, UA Negative    pH, UA 5.5    Protein, UA 30     Urobilinogen, UA 0.2    Nitrite, UA Negative    Leukocytes, UA Negative Negative   INFLUENZA VACCINE ADMINISTERED.    Assessment & Plan:   1. Routine physical examination   2. Pure hypercholesterolemia   3. Glucose intolerance (impaired glucose tolerance)   4. Essential hypertension, benign   5. ELECTROCARDIOGRAM, ABNORMAL   6. DDD (degenerative disc disease), cervical   7. Need for prophylactic vaccination and inoculation against influenza   8. Restless leg syndrome   9. Nocturnal hypoxia     1.  Complete Physical Examination: anticipatory guidance --- exercise, weight loss, calcium 3 servings daily.  Pap smear not indicated due to hysterectomy status. Refer for mammogram.  Colonoscopy UTD.  Immunizations reviewed; s/p flu vaccine.  2.  Hypercholesterolemia: uncontrolled; pt refuses statin therapy; highly recommend weight loss, exercise, and low-cholesterol food choices. 3.  Glucose intolerance: stable; recommend weight loss, exercise, low-sugar and low-carbohydrate food choices. 4.  HTN: controlled; labs stable; refills provided. 5.  EKG abnormal: stable EKG. 6.  DDD cervical: suffers with chronic pain.  Managed by ortho.  Agreeable to one time rx for hydrocodone for more severe pain. 7. RLS syndrome: intolerant to Mirapex; recommend trial of Requip yet contraindicated due to anaphylaxis with St. John's Wort. 8.  Nocturnal hypoxia: New; diagnosed during recent sleep study; to follow-up with sleep medicine to review results.  Refer to pulmonology per recommendations of sleep medicine; history of tobacco abuse now s/p cessation.   Orders Placed This Encounter  Procedures  . Flu Vaccine QUAD 36+ mos IM  . CBC with Differential/Platelet  . Comprehensive metabolic panel    Order Specific Question:  Has the patient fasted?    Answer:  Yes  . Hemoglobin A1c  . Lipid panel    Order Specific Question:  Has the patient fasted?    Answer:  Yes  . TSH  . Vit D  25 hydroxy (rtn  osteoporosis monitoring)  . Ambulatory referral to Pulmonology    Referral Priority:  Routine    Referral Type:  Consultation    Referral Reason:  Specialty Services Required  Requested Specialty:  Pulmonary Disease    Number of Visits Requested:  1  . POCT urinalysis dipstick  . EKG 12-Lead   Meds ordered this encounter  Medications  . HYDROcodone-acetaminophen (NORCO/VICODIN) 5-325 MG per tablet    Sig: Take 1 tablet by mouth every 6 (six) hours as needed for moderate pain.    Dispense:  30 tablet    Refill:  0  . montelukast (SINGULAIR) 10 MG tablet    Sig: TAKE 1 TABLET (10 MG TOTAL) BY MOUTH AT BEDTIME.    Dispense:  90 tablet    Refill:  3  . losartan (COZAAR) 100 MG tablet    Sig: Take 1 tablet (100 mg total) by mouth daily.    Dispense:  90 tablet    Refill:  3    Return in about 4 months (around 08/05/2015) for recheck blood pressure, restless leg.    Kristi Elayne Guerin, M.D. [facility] [address] [place], [place]  [address] [phone] phone [phone] fax

## 2015-04-06 LAB — HEMOGLOBIN A1C
Hgb A1c MFr Bld: 5.6 % (ref <5.7)
Mean Plasma Glucose: 114 mg/dL (ref <117)

## 2015-04-06 LAB — VITAMIN D 25 HYDROXY (VIT D DEFICIENCY, FRACTURES): Vit D, 25-Hydroxy: 48 ng/mL (ref 30–100)

## 2015-04-19 ENCOUNTER — Other Ambulatory Visit: Payer: Self-pay | Admitting: Family Medicine

## 2015-04-19 ENCOUNTER — Telehealth: Payer: Self-pay

## 2015-04-19 MED ORDER — AMLODIPINE BESYLATE 10 MG PO TABS: 10.0000 mg | ORAL_TABLET | Freq: Every day | ORAL | Status: DC

## 2015-04-19 NOTE — Telephone Encounter (Signed)
Pt states that she changed pharmacies due to insurance and the norvasc did not have any refills and so it was not able to be transferred and she needs this called in to [facility]

## 2015-04-19 NOTE — Telephone Encounter (Signed)
Rx sent. Pt notified.

## 2015-05-09 ENCOUNTER — Encounter: Payer: Self-pay | Admitting: Family Medicine

## 2015-05-09 MED ORDER — LOSARTAN POTASSIUM 100 MG PO TABS: 100.0000 mg | ORAL_TABLET | Freq: Every day | ORAL | Status: DC

## 2015-05-09 MED ORDER — MONTELUKAST SODIUM 10 MG PO TABS: ORAL_TABLET | ORAL | Status: DC

## 2015-05-19 ENCOUNTER — Encounter: Payer: Self-pay | Admitting: *Deleted

## 2015-05-21 ENCOUNTER — Other Ambulatory Visit: Payer: Self-pay | Admitting: Family Medicine

## 2015-06-21 ENCOUNTER — Other Ambulatory Visit: Payer: Self-pay | Admitting: Family Medicine

## 2015-07-22 ENCOUNTER — Other Ambulatory Visit: Payer: Self-pay | Admitting: Family Medicine

## 2015-07-30 ENCOUNTER — Telehealth: Payer: Self-pay | Admitting: Family Medicine

## 2015-07-30 DIAGNOSIS — Z1231 Encounter for screening mammogram for malignant neoplasm of breast: Secondary | ICD-10-CM

## 2015-07-30 NOTE — Telephone Encounter (Signed)
LEFT A MESSAGE FOR PATIENT TO CALL AND LET us KNOW IF SHE HAS HAD A MAMMOGRAM IN THE PAST YEAR, IF NOT, WE NEED TO ORDER ONE FOR HER.

## 2015-07-30 NOTE — Telephone Encounter (Signed)
SPOKE WITH PATIENT AND SHE HAD NOT HAD HER MAMMOGRAM SO WE PUT AN ORDER IN FOR IT.  SHE IS GOING TO CALL [facility] TO SCHEDULE IT HERSELF.  SHE ALSO HAD A QUESTION ABOUT A HEP C SCREENING, WE ARE GOING TO CALL [facility] AND SEE IF SHE ACTUALLY HAD IT OR NOT.  I WILL TRY AND LET HER KNOW AT HER APPOINTMENT ON 08/02/15 WITH DR. Tamala Julian,

## 2015-08-02 ENCOUNTER — Ambulatory Visit (INDEPENDENT_AMBULATORY_CARE_PROVIDER_SITE_OTHER): Payer: 59 | Admitting: Family Medicine

## 2015-08-02 ENCOUNTER — Encounter: Payer: Self-pay | Admitting: Family Medicine

## 2015-08-02 VITALS — BP 128/82 | HR 60 | Temp 98.2°F | Resp 18 | Wt 213.2 lb

## 2015-08-02 DIAGNOSIS — I10 Essential (primary) hypertension: Secondary | ICD-10-CM | POA: Diagnosis not present

## 2015-08-02 DIAGNOSIS — Z1159 Encounter for screening for other viral diseases: Secondary | ICD-10-CM | POA: Diagnosis not present

## 2015-08-02 DIAGNOSIS — Z114 Encounter for screening for human immunodeficiency virus [HIV]: Secondary | ICD-10-CM | POA: Diagnosis not present

## 2015-08-02 DIAGNOSIS — K625 Hemorrhage of anus and rectum: Secondary | ICD-10-CM | POA: Diagnosis not present

## 2015-08-02 DIAGNOSIS — R7302 Impaired glucose tolerance (oral): Secondary | ICD-10-CM | POA: Diagnosis not present

## 2015-08-02 DIAGNOSIS — K7689 Other specified diseases of liver: Secondary | ICD-10-CM | POA: Diagnosis not present

## 2015-08-02 DIAGNOSIS — Z1231 Encounter for screening mammogram for malignant neoplasm of breast: Secondary | ICD-10-CM | POA: Diagnosis not present

## 2015-08-02 DIAGNOSIS — R945 Abnormal results of liver function studies: Secondary | ICD-10-CM

## 2015-08-02 LAB — CBC WITH DIFFERENTIAL/PLATELET
Basophils Absolute: 0.1 10*3/uL (ref 0.0–0.1)
Basophils Relative: 1 % (ref 0–1)
Eosinophils Absolute: 0.2 10*3/uL (ref 0.0–0.7)
Eosinophils Relative: 3 % (ref 0–5)
HCT: 38.1 % (ref 36.0–46.0)
Hemoglobin: 12.8 g/dL (ref 12.0–15.0)
Lymphocytes Relative: 31 % (ref 12–46)
Lymphs Abs: 1.8 10*3/uL (ref 0.7–4.0)
MCH: 29.5 pg (ref 26.0–34.0)
MCHC: 33.6 g/dL (ref 30.0–36.0)
MCV: 87.8 fL (ref 78.0–100.0)
MPV: 11.5 fL (ref 8.6–12.4)
Monocytes Absolute: 0.5 10*3/uL (ref 0.1–1.0)
Monocytes Relative: 8 % (ref 3–12)
Neutro Abs: 3.2 10*3/uL (ref 1.7–7.7)
Neutrophils Relative %: 57 % (ref 43–77)
Platelets: 172 10*3/uL (ref 150–400)
RBC: 4.34 MIL/uL (ref 3.87–5.11)
RDW: 14.6 % (ref 11.5–15.5)
WBC: 5.7 10*3/uL (ref 4.0–10.5)

## 2015-08-02 LAB — COMPREHENSIVE METABOLIC PANEL
ALT: 42 U/L — ABNORMAL HIGH (ref 6–29)
AST: 31 U/L (ref 10–35)
Albumin: 4.2 g/dL (ref 3.6–5.1)
Alkaline Phosphatase: 77 U/L (ref 33–130)
BUN: 11 mg/dL (ref 7–25)
CO2: 27 mmol/L (ref 20–31)
Calcium: 10 mg/dL (ref 8.6–10.4)
Chloride: 101 mmol/L (ref 98–110)
Creat: 0.59 mg/dL (ref 0.50–1.05)
Glucose, Bld: 113 mg/dL — ABNORMAL HIGH (ref 65–99)
Potassium: 4.1 mmol/L (ref 3.5–5.3)
Sodium: 136 mmol/L (ref 135–146)
Total Bilirubin: 0.5 mg/dL (ref 0.2–1.2)
Total Protein: 6.5 g/dL (ref 6.1–8.1)

## 2015-08-02 NOTE — Patient Instructions (Signed)
Why follow it? Research shows. . Those who follow the Mediterranean diet have a reduced risk of heart disease  . The diet is associated with a reduced incidence of Parkinson's and Alzheimer's diseases . People following the diet may have longer life expectancies and lower rates of chronic diseases  . The Dietary Guidelines for Americans recommends the Mediterranean diet as an eating plan to promote health and prevent disease  What Is the Mediterranean Diet?  . Healthy eating plan based on typical foods and recipes of Mediterranean-style cooking . The diet is primarily a plant based diet; these foods should make up a majority of meals   Starches - Plant based foods should make up a majority of meals - They are an important sources of vitamins, minerals, energy, antioxidants, and fiber - Choose whole grains, foods high in fiber and minimally processed items  - Typical grain sources include wheat, oats, barley, corn, brown rice, bulgar, farro, millet, polenta, couscous  - Various types of beans include chickpeas, lentils, fava beans, black beans, white beans   Fruits  Veggies - Large quantities of antioxidant rich fruits & veggies; 6 or more servings  - Vegetables can be eaten raw or lightly drizzled with oil and cooked  - Vegetables common to the traditional Mediterranean Diet include: artichokes, arugula, beets, broccoli, brussel sprouts, cabbage, carrots, celery, collard greens, cucumbers, eggplant, kale, leeks, lemons, lettuce, mushrooms, okra, onions, peas, peppers, potatoes, pumpkin, radishes, rutabaga, shallots, spinach, sweet potatoes, turnips, zucchini - Fruits common to the Mediterranean Diet include: apples, apricots, avocados, cherries, clementines, dates, figs, grapefruits, grapes, melons, nectarines, oranges, peaches, pears, pomegranates, strawberries, tangerines  Fats - Replace butter and margarine with healthy oils, such as olive oil, canola oil, and tahini  - Limit nuts to no  more than a handful a day  - Nuts include walnuts, almonds, pecans, pistachios, pine nuts  - Limit or avoid candied, honey roasted or heavily salted nuts - Olives are central to the [employer] - can be eaten whole or used in a variety of dishes   Meats Protein - Limiting red meat: no more than a few times a month - When eating red meat: choose lean cuts and keep the portion to the size of deck of cards - Eggs: approx. 0 to 4 times a week  - Fish and lean poultry: at least 2 a week  - Healthy protein sources include, chicken, [place], lean beef, lamb - Increase intake of seafood such as tuna, salmon, trout, mackerel, shrimp, scallops - Avoid or limit high fat processed meats such as sausage and bacon  Dairy - Include moderate amounts of low fat dairy products  - Focus on healthy dairy such as fat free yogurt, skim milk, low or reduced fat cheese - Limit dairy products higher in fat such as whole or 2% milk, cheese, ice cream  Alcohol - Moderate amounts of red wine is ok  - No more than 5 oz daily for women (all ages) and men older than age 70  - No more than 10 oz of wine daily for men younger than 32  Other - Limit sweets and other desserts  - Use herbs and spices instead of salt to flavor foods  - Herbs and spices common to the traditional Mediterranean Diet include: basil, bay leaves, chives, cloves, cumin, fennel, garlic, lavender, marjoram, mint, oregano, parsley, pepper, rosemary, sage, savory, sumac, tarragon, thyme   It's not just a diet, it's a lifestyle:  . The Mediterranean diet includes  lifestyle factors typical of those in the region  . Foods, drinks and meals are best eaten with others and savored . Daily physical activity is important for overall good health . This could be strenuous exercise like running and aerobics . This could also be more leisurely activities such as walking, housework, yard-work, or taking the stairs . Moderation is the key; a balanced and  healthy diet accommodates most foods and drinks . Consider portion sizes and frequency of consumption of certain foods   Meal Ideas & Options:  . Breakfast:  o Whole wheat toast or whole wheat English muffins with peanut butter & hard boiled egg o Steel cut oats topped with apples & cinnamon and skim milk  o Fresh fruit: banana, strawberries, melon, berries, peaches  o Smoothies: strawberries, bananas, greek yogurt, peanut butter o Low fat greek yogurt with blueberries and granola  o Egg white omelet with spinach and mushrooms o Breakfast couscous: whole wheat couscous, apricots, skim milk, cranberries  . Sandwiches:  o Hummus and grilled vegetables (peppers, zucchini, squash) on whole wheat bread   o Grilled chicken on whole wheat pita with lettuce, tomatoes, cucumbers or tzatziki  o Tuna salad on whole wheat bread: tuna salad made with greek yogurt, olives, red peppers, capers, green onions o Garlic rosemary lamb pita: lamb sauted with garlic, rosemary, salt & pepper; add lettuce, cucumber, greek yogurt to pita - flavor with lemon juice and black pepper  . Seafood:  o Mediterranean grilled salmon, seasoned with garlic, basil, parsley, lemon juice and black pepper o Shrimp, lemon, and spinach whole-grain pasta salad made with low fat greek yogurt  o Seared scallops with lemon orzo  o Seared tuna steaks seasoned salt, pepper, coriander topped with tomato mixture of olives, tomatoes, olive oil, minced garlic, parsley, green onions and cappers  . Meats:  o Herbed greek chicken salad with kalamata olives, cucumber, feta  o Red bell peppers stuffed with spinach, bulgur, lean ground beef (or lentils) & topped with feta   o Kebabs: skewers of chicken, tomatoes, onions, zucchini, squash  o [place] burgers: made with red onions, mint, dill, lemon juice, feta cheese topped with roasted red peppers . Vegetarian o Cucumber salad: cucumbers, artichoke hearts, celery, red onion, feta cheese, tossed in  olive oil & lemon juice  o Hummus and whole grain pita points with a greek salad (lettuce, tomato, feta, olives, cucumbers, red onion) o Lentil soup with celery, carrots made with vegetable broth, garlic, salt and pepper  o Tabouli salad: parsley, bulgur, mint, scallions, cucumbers, tomato, radishes, lemon juice, olive oil, salt and pepper.

## 2015-08-02 NOTE — Progress Notes (Signed)
Subjective:    Patient ID: Emily Melton, female    DOB: [birth year], 55 y.o.   MRN: [id number]  08/02/2015  Follow-up and Medication Problem   HPI This 55 y.o. female presents for four month follow-up:   1. HTN: Patient reports good compliance with medication, good tolerance to medication, and good symptom control.    2. Glucose intolerance: glucose 106.    3. RLS:   4.  Elevated LFTs: due for repeat LFTs; discussed with GI specialist in 04/2015.    5. Nocturnal hypoxia:  Appointment with Dr. Annamaria Boots on 08/10/15.   6.  Breast cancer screening: scheduled 08-11-15.  Last 07-2013.    7. GERD: s/p evaluation by GI physician in 04/2015.  Discussed with GI specialist elevated LFTs.  Decreased Dexilant to 82m daily. No Barrett's on last EGD.  Next colonoscopy 2020.  Typical routine the day following GI appointment, usually has 3 stools before noon; with decrease in Dexilant dose, less frequent b.m.s and less gas.  With wiping, there was red fresh blood.  Had a boil but not close to anus.  With next b.m., was more blood with b.m.  No blood in underpants; repeat b.m. Later in day, large amount of blood with clots.  Had repairman at home; planned to undergo evaluation due to worsening bleeding; went to [facility] in.  Evaluated by AKem Kays diagnosed with anal fissure. Prescribed steroid suppository with improvement; no recurrence.  Hgb 12.5 at that visit.  Pain-free bowel movements.    Electrical shock: intermittent in neck; occurs with certain movements. Onset before Christmas; for past six weeks.  Does not occur daily; intermittent.  R leg bump shin: has pain in shin all the time.  Skin center.  KNehemiah Massed  Had been seeing female provider; burned facial lesion off in Spring 2016.  Tried burning R upper arm lesion.  No history of skin cancer.    L sciatica:  Flared up for weeks.    B knee pain: not exercising as much.   Receiving disability from work; court case in 08/2015 for SSI.   Plans to move out if gets SSI.  When at daughter's house this summer, did not miss husband at home; missed home but not husband.   Review of Systems  Constitutional: Negative for fever, chills, diaphoresis and fatigue.  Eyes: Negative for visual disturbance.  Respiratory: Negative for cough and shortness of breath.   Cardiovascular: Negative for chest pain, palpitations and leg swelling.  Gastrointestinal: Negative for nausea, vomiting, abdominal pain, diarrhea and constipation.  Endocrine: Negative for cold intolerance, heat intolerance, polydipsia, polyphagia and polyuria.  Neurological: Negative for dizziness, tremors, seizures, syncope, facial asymmetry, speech difficulty, weakness, light-headedness, numbness and headaches.    Past Medical History  Diagnosis Date  . Depression   . Narcolepsy     w/o cataplexy  . GERD (gastroesophageal reflux disease)   . HTN (hypertension)   . 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 Chicken pox     childhood  . Migraine, unspecified, without mention of intractable migraine without mention of status migrainosus   . Periodic limb movement disorder   . Nonspecific abnormal electrocardiogram (ECG) (EKG)   . Diaphragmatic hernia without mention of obstruction or gangrene   . Tobacco use disorder   . Barrett's esophagus   . Personal history of colonic polyps   . Allergic rhinitis, cause unspecified   . Insomnia, unspecified   .  Unspecified vitamin D deficiency   . Other abnormal glucose   . Anemia   . Allergy   . Osteoporosis   . Fibromyalgia    Past Surgical History  Procedure Laterality Date  . Cesarean section      x2  1984 and 1986  . Pilonidal cyst resection  1992  . Cholecystectomy    . Carpal tunnel release - bilateral    . Dilation and curettage of uterus      SAB  . Tubel ligation  2002  . Breast biopsy  2005     R   Benign  . Fusion of cervical spine  2007    C5,C6,C7, and  T1  . Laprascopy  2009    for RLQ pain:R oophorectomy performed no improvement in pain.   . Vaginal hysterectomy  2009    fibroid/endometriosis, one L ovary remaining  . R shoulder surgery  06/24/09    2 tears in rotator cuff,bone spurs. Dr. Modena Morrow  . 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 Spine surgery  09/2011    Cervical spine fusion  . Back surgery    . Tubal ligation    . Colonoscopy  03/17/2012    [facility]  . Esophagogastroduodenoscopy  03/17/2012    no Barrett's esophagus. [facility].  Marland Kitchen Abdominal hysterectomy      fibroids and endometriosis.  ovarian resection R.   Allergies  Allergen Reactions  . Duloxetine Hcl Other (See Comments)    Hypertension, mood changes  . Mirtazapine Swelling  . Serotonin Reuptake Inhibitors (Ssris) Other (See Comments)    Hypertension, mood changes  . St Johns Wort Anaphylaxis  . Bupropion Hcl   . Celebrex [Celecoxib]   . Effexor [Venlafaxine] Other (See Comments)    Moodiness, vivid dreams, poor sleep cycle.  . Lyrica [Pregabalin]   . Methylphenidate Hcl   . Mirapex [Pramipexole]     HTN, hallucinations and nausea  . Modafinil   . Rabeprazole Sodium   . Rabeprazole Sodium Other (See Comments)  . Lisinopril Rash    Hypertension   Current Outpatient Prescriptions  Medication Sig Dispense Refill  . amLODipine (NORVASC) 10 MG tablet Take 1 tablet (10 mg total) by mouth daily. 90 tablet 2  . B Complex Vitamins (B COMPLEX 50 PO) Take 50 mg by mouth daily.    . cetirizine (ZYRTEC) 10 MG tablet Take 10 mg by mouth daily.      . Cholecalciferol (VITAMIN D3) 2000 UNITS TABS Take 1 tablet by mouth daily.     Marland Kitchen Dexlansoprazole (DEXILANT) 30 MG capsule Take 30 mg by mouth daily.    Marland Kitchen HYDROcodone-acetaminophen (NORCO/VICODIN) 5-325 MG per tablet Take 1 tablet by mouth every 6 (six) hours as needed for moderate pain. 30 tablet 0  . HYDROmorphone (DILAUDID) 2 MG tablet Take by mouth every 4 (four) hours as needed for severe pain.    .  hyoscyamine (ANASPAZ) 0.125 MG TBDP Place 0.125-0.25 mg under the tongue every 6 (six) hours as needed. For stomach cramps    . losartan (COZAAR) 100 MG tablet Take 1 tablet (100 mg total) by mouth daily. 90 tablet 3  . losartan (COZAAR) 100 MG tablet take 1 tablet by mouth once daily 90 tablet 3  . montelukast (SINGULAIR) 10 MG tablet take 1 tablet by mouth at bedtime 90 tablet 3  . Multiple Vitamin (MULTIVITAMIN) tablet Take 1 tablet by mouth daily.    . Omega-3 Fatty Acids (FISH OIL PO)  Take 1,200 mg by mouth daily.    . Probiotic Product (PROBIOTIC DAILY PO) Take by mouth.    . propranolol (INDERAL) 20 MG tablet take 3 tablets by mouth twice a day 180 tablet 5  . psyllium (HYDROCIL/METAMUCIL) 95 % PACK Take 1 packet by mouth daily.    Marland Kitchen tiZANidine (ZANAFLEX) 4 MG capsule take 1 capsule by mouth every evening 90 capsule 0  . dexlansoprazole (DEXILANT) 60 MG capsule Take 30 mg by mouth daily. Reported on 08/02/2015     No current facility-administered medications for this visit.   Social History   Social History  . Marital Status: Married    Spouse Name: N/A  . Number of Children: 2  . Years of Education: High Schol   Occupational History  . Disabled    Social History Main Topics  . Smoking status: Former Research scientist (life sciences)  . Smokeless tobacco: Former Systems developer    Quit date: 11/19/2013     Comment: smoked 1 ppd 30-35 years; quit in 2011  . Alcohol Use: 0.0 oz/week    0 Standard drinks or equivalent per week     Comment: occasional  . Drug Use: Yes    Special: Flunitrazepam  . Sexual Activity: Yes    Birth Control/ Protection: Surgical   Other Topics Concern  . Not on file   Social History Narrative   Patient exercises 3-4 times per week (walking x 20 minutes 3 days a week).    Marital status:  Married x 11 years; happily married; second marriage; no abuse.      Children:  2 children(31, 29); three grandsons.      Employment:  Long term disability through work; applying for disability in  2015.  Quit working 02/2013.      Tobacco:  1/2 ppd x 34 years.; quit smoking 11/2013.       Alcohol: rare;        Drugs: none      Exercise:  Rare       lives in [place].       Seatbelt:  100%       Guns: none   1-2 cups of coffee a day/ rare occasional Pepsi       Family History  Problem Relation Age of Onset  . Bipolar disorder Mother   . Cancer Mother     bladder  . Hypertension Mother   . Colon polyps Mother   . Osteoarthritis Mother   . Diabetes Mother   . Thyroid disease Mother   . Heart disease Father 85    CAD/AMI with stenting/PAD.  Marland Kitchen Hyperlipidemia Father   . Cancer Father     skin  . Angina Father   . Asthma Daughter   . Cancer Maternal Grandmother     colon  . Heart disease Maternal Grandmother   . Cancer Maternal Grandfather     lung  . Parkinson's disease Maternal Grandfather   . Stroke Maternal Grandfather   . Diabetes Maternal Grandfather   . Hyperlipidemia Maternal Grandfather   . Rheum arthritis Paternal Grandmother   . Heart attack Paternal Grandfather        Objective:    BP 128/82 mmHg  Pulse 60  Temp(Src) 98.2 F (36.8 C) (Oral)  Resp 18  Wt 213 lb 3.2 oz (96.707 kg)  SpO2 95% Physical Exam  Constitutional: She is oriented to person, place, and time. She appears well-developed and well-nourished. No distress.  HENT:  Head: Normocephalic and atraumatic.  Right  Ear: External ear normal.  Left Ear: External ear normal.  Nose: Nose normal.  Mouth/Throat: Oropharynx is clear and moist.  Eyes: Conjunctivae and EOM are normal. Pupils are equal, round, and reactive to light.  Neck: Normal range of motion. Neck supple. Carotid bruit is not present. No thyromegaly present.  Cardiovascular: Normal rate, regular rhythm, normal heart sounds and intact distal pulses.  Exam reveals no gallop and no friction rub.   No murmur heard. Pulmonary/Chest: Effort normal and breath sounds normal. She has no wheezes. She has no rales.  Abdominal: Soft.  Bowel sounds are normal. She exhibits no distension and no mass. There is no tenderness. There is no rebound and no guarding.  Lymphadenopathy:    She has no cervical adenopathy.  Neurological: She is alert and oriented to person, place, and time. No cranial nerve deficit.  Skin: Skin is warm and dry. No rash noted. She is not diaphoretic. No erythema. No pallor.  Psychiatric: She has a normal mood and affect. Her behavior is normal.   Results for orders placed or performed in visit on 04/05/15  CBC with Differential/Platelet  Result Value Ref Range   WBC 5.8 4.0 - 10.5 K/uL   RBC 4.29 3.87 - 5.11 MIL/uL   Hemoglobin 12.8 12.0 - 15.0 g/dL   HCT 37.9 36.0 - 46.0 %   MCV 88.3 78.0 - 100.0 fL   MCH 29.8 26.0 - 34.0 pg   MCHC 33.8 30.0 - 36.0 g/dL   RDW 14.0 11.5 - 15.5 %   Platelets 192 150 - 400 K/uL   MPV 11.2 8.6 - 12.4 fL   Neutrophils Relative % 61 43 - 77 %   Neutro Abs 3.5 1.7 - 7.7 K/uL   Lymphocytes Relative 31 12 - 46 %   Lymphs Abs 1.8 0.7 - 4.0 K/uL   Monocytes Relative 5 3 - 12 %   Monocytes Absolute 0.3 0.1 - 1.0 K/uL   Eosinophils Relative 2 0 - 5 %   Eosinophils Absolute 0.1 0.0 - 0.7 K/uL   Basophils Relative 1 0 - 1 %   Basophils Absolute 0.1 0.0 - 0.1 K/uL   Smear Review Criteria for review not met   Comprehensive metabolic panel  Result Value Ref Range   Sodium 139 135 - 146 mmol/L   Potassium 4.3 3.5 - 5.3 mmol/L   Chloride 104 98 - 110 mmol/L   CO2 28 20 - 31 mmol/L   Glucose, Bld 106 (H) 65 - 99 mg/dL   BUN 15 7 - 25 mg/dL   Creat 0.70 0.50 - 1.05 mg/dL   Total Bilirubin 0.7 0.2 - 1.2 mg/dL   Alkaline Phosphatase 72 33 - 130 U/L   AST 29 10 - 35 U/L   ALT 38 (H) 6 - 29 U/L   Total Protein 6.9 6.1 - 8.1 g/dL   Albumin 4.4 3.6 - 5.1 g/dL   Calcium 10.1 8.6 - 10.4 mg/dL  Hemoglobin A1c  Result Value Ref Range   Hgb A1c MFr Bld 5.6 <5.7 %   Mean Plasma Glucose 114 <117 mg/dL  Lipid panel  Result Value Ref Range   Cholesterol 226 (H) 125 - 200 mg/dL    Triglycerides 110 <150 mg/dL   HDL 60 >=46 mg/dL   Total CHOL/HDL Ratio 3.8 <=5.0 Ratio   VLDL 22 <30 mg/dL   LDL Cholesterol 144 (H) <130 mg/dL  TSH  Result Value Ref Range   TSH 1.595 0.350 - 4.500 uIU/mL  Vit  D  25 hydroxy (rtn osteoporosis monitoring)  Result Value Ref Range   Vit D, 25-Hydroxy 48 30 - 100 ng/mL  POCT urinalysis dipstick  Result Value Ref Range   Color, UA Yellow    Clarity, UA Clear    Glucose, UA Negative    Bilirubin, UA Negative    Ketones, UA Negative    Spec Grav, UA 1.025    Blood, UA Negative    pH, UA 5.5    Protein, UA 30    Urobilinogen, UA 0.2    Nitrite, UA Negative    Leukocytes, UA Negative Negative       Assessment & Plan:   1. Essential hypertension   2. Glucose intolerance (impaired glucose tolerance)   3. Liver function abnormality   4. Screening for HIV (human immunodeficiency virus)   5. Need for hepatitis C screening test     Orders Placed This Encounter  Procedures  . Comprehensive metabolic panel  . Hepatitis C antibody  . HIV antibody  . Hemoglobin A1c  . Acute Hep Panel & Hep B Surface Ab   Meds ordered this encounter  Medications  . Dexlansoprazole (DEXILANT) 30 MG capsule    Sig: Take 30 mg by mouth daily.    No Follow-up on file.    Odilon Cass Elayne Guerin, M.D. [facility] [address] [place], [place]  [address] [phone] phone [phone] fax

## 2015-08-03 LAB — HEMOGLOBIN A1C
Hgb A1c MFr Bld: 5.7 % — ABNORMAL HIGH (ref <5.7)
Mean Plasma Glucose: 117 mg/dL — ABNORMAL HIGH (ref <117)

## 2015-08-03 LAB — ACUTE HEP PANEL AND HEP B SURFACE AB
HCV Ab: NEGATIVE
Hep A IgM: NONREACTIVE
Hep B C IgM: NONREACTIVE
Hep B S Ab: NEGATIVE
Hepatitis B Surface Ag: NEGATIVE

## 2015-08-03 LAB — HIV ANTIBODY (ROUTINE TESTING W REFLEX): HIV 1&2 Ab, 4th Generation: NONREACTIVE

## 2015-08-04 ENCOUNTER — Telehealth: Payer: Self-pay

## 2015-08-04 NOTE — Telephone Encounter (Signed)
Waiting on payment of $33.75 for 56 pages. From [employer].

## 2015-08-10 ENCOUNTER — Encounter: Payer: Self-pay | Admitting: Internal Medicine

## 2015-08-10 ENCOUNTER — Ambulatory Visit (INDEPENDENT_AMBULATORY_CARE_PROVIDER_SITE_OTHER): Payer: Commercial Managed Care - HMO | Admitting: Internal Medicine

## 2015-08-10 VITALS — BP 152/98 | HR 57 | Ht 62.0 in | Wt 214.0 lb

## 2015-08-10 DIAGNOSIS — Z87891 Personal history of nicotine dependence: Secondary | ICD-10-CM | POA: Diagnosis not present

## 2015-08-10 DIAGNOSIS — R0600 Dyspnea, unspecified: Secondary | ICD-10-CM

## 2015-08-10 DIAGNOSIS — G4710 Hypersomnia, unspecified: Secondary | ICD-10-CM

## 2015-08-10 DIAGNOSIS — R0609 Other forms of dyspnea: Secondary | ICD-10-CM

## 2015-08-10 DIAGNOSIS — G47419 Narcolepsy without cataplexy: Secondary | ICD-10-CM

## 2015-08-10 NOTE — Patient Instructions (Addendum)
Order- schedule   NPSG (not split),  Followed next day by MSLT      Dx hypersomnia with narcolepsy                 Try to be off Zanaflex for 2 days before these studies  Order- schedule PFT       Dx dyspnea on exertion, former smoker

## 2015-08-10 NOTE — Progress Notes (Signed)
08/10/2015-55 year old female former smoker Referred by Dr Tamala Julian for nocturnal hypoxemia. Sleep Study 2016 - Dr Rexene Alberts. History of Narcolepsy - diagnosed 2001- tried and failed Ritalin and Provigil. Epworth Score: 13 NPSG  11/30/14- [facility] AHI 5.3/ hr, desat to 83%, weight 216 lbs An earlier study in 2001 in [place], [place], with report no longer available, included MSLT and she says diagnosed narcolepsy without sleep apnea. The 2016 sleep study was supposed to have included and MST but that didn't get done and she didn't like her experience there.. She takes Zanaflex for cervical radiculopathy pain and it helps her sleep. She avoids naps. Only sleeps on her sides. Denies sleep paralysis. Describes 2 episodes of possible cataplexy when knees gave out, but circumstances not clear. Previous trials of Provigil and Ritalin both said to cause HBP and "swelling". She ended up on Dexedrine for a while. Tried Mirapex for vaguely defined limb jerks. This caused a lot of malaise and nausea. History of recurrent bronchitis and chronic cough until she quits smoking. Several previous pneumonias.  Prior to Admission medications   Medication Sig Start Date End Date Taking? Authorizing Provider  amLODipine (NORVASC) 10 MG tablet Take 1 tablet (10 mg total) by mouth daily. 04/19/15   Wardell Honour, MD  B Complex Vitamins (B COMPLEX 50 PO) Take 50 mg by mouth daily.    Historical Provider, MD  cetirizine (ZYRTEC) 10 MG tablet Take 10 mg by mouth daily.      Historical Provider, MD  Cholecalciferol (VITAMIN D3) 2000 UNITS TABS Take 1 tablet by mouth daily.     Historical Provider, MD  Dexlansoprazole (DEXILANT) 30 MG capsule Take 30 mg by mouth daily.    Historical Provider, MD  HYDROcodone-acetaminophen (NORCO/VICODIN) 5-325 MG per tablet Take 1 tablet by mouth every 6 (six) hours as needed for moderate pain. 04/05/15   Wardell Honour, MD  HYDROmorphone (DILAUDID) 2 MG tablet Take by mouth every 4  (four) hours as needed for severe pain.    Historical Provider, MD  hyoscyamine (ANASPAZ) 0.125 MG TBDP Place 0.125-0.25 mg under the tongue every 6 (six) hours as needed. For stomach cramps    Historical Provider, MD  losartan (COZAAR) 100 MG tablet Take 1 tablet (100 mg total) by mouth daily. 05/09/15   Wardell Honour, MD  montelukast (SINGULAIR) 10 MG tablet take 1 tablet by mouth at bedtime 06/23/15   Wardell Honour, MD  Multiple Vitamin (MULTIVITAMIN) tablet Take 1 tablet by mouth daily.    Historical Provider, MD  Omega-3 Fatty Acids (FISH OIL PO) Take 1,200 mg by mouth daily.    Historical Provider, MD  Probiotic Product (PROBIOTIC DAILY PO) Take by mouth.    Historical Provider, MD  propranolol (INDERAL) 20 MG tablet take 3 tablets by mouth twice a day 04/20/15   Wardell Honour, MD  psyllium (HYDROCIL/METAMUCIL) 95 % PACK Take 1 packet by mouth daily.    Historical Provider, MD  tiZANidine (ZANAFLEX) 4 MG capsule take 1 capsule by mouth every evening 07/23/15   Wardell Honour, MD   Past Medical History  Diagnosis Date  . Depression   . Narcolepsy     w/o cataplexy  . GERD (gastroesophageal reflux disease)   . HTN (hypertension)   . 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 Chicken pox     childhood  . Migraine, unspecified, without mention of intractable migraine  without mention of status migrainosus   . Periodic limb movement disorder   . Nonspecific abnormal electrocardiogram (ECG) (EKG)   . Diaphragmatic hernia without mention of obstruction or gangrene   . Tobacco use disorder   . Barrett's esophagus   . Personal history of colonic polyps   . Allergic rhinitis, cause unspecified   . Insomnia, unspecified   . Unspecified vitamin D deficiency   . Other abnormal glucose   . Anemia   . Allergy   . Osteoporosis   . Fibromyalgia    Past Surgical History  Procedure Laterality Date  . Cesarean section      x2  1984 and 1986  .  Pilonidal cyst resection  1992  . Cholecystectomy    . Carpal tunnel release - bilateral    . Dilation and curettage of uterus      SAB  . Tubel ligation  2002  . Fusion of cervical spine  2007    C5,C6,C7, and T1  . Laprascopy  2009    for RLQ pain:R oophorectomy performed no improvement in pain.   . Vaginal hysterectomy  2009    fibroid/endometriosis, one L ovary remaining  . R shoulder surgery  06/24/09    2 tears in rotator cuff,bone spurs. Dr. Modena Morrow  . 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 Spine surgery  09/2011    Cervical spine fusion  . Back surgery    . Tubal ligation    . Colonoscopy  03/17/2012    [facility]  . Esophagogastroduodenoscopy  03/17/2012    no Barrett's esophagus. [facility].  Marland Kitchen Abdominal hysterectomy      fibroids and endometriosis.  ovarian resection R.  . Breast excisional biopsy  2005     R   Benign   Family History  Problem Relation Age of Onset  . Bipolar disorder Mother   . Cancer Mother     bladder  . Hypertension Mother   . Colon polyps Mother   . Osteoarthritis Mother   . Diabetes Mother   . Thyroid disease Mother   . Heart disease Father 22    CAD/AMI with stenting/PAD.  Marland Kitchen Hyperlipidemia Father   . Cancer Father     skin  . Angina Father   . Asthma Daughter   . Cancer Maternal Grandmother     colon  . Heart disease Maternal Grandmother   . Cancer Maternal Grandfather     lung  . Parkinson's disease Maternal Grandfather   . Stroke Maternal Grandfather   . Diabetes Maternal Grandfather   . Hyperlipidemia Maternal Grandfather   . Rheum arthritis Paternal Grandmother   . Heart attack Paternal Grandfather    Social History   Social History  . Marital Status: Married    Spouse Name: N/A  . Number of Children: 2  . Years of Education: High Schol   Occupational History  . Disabled    Social History Main Topics  . Smoking status: Former Smoker -- 1.00 packs/day for 36 years    Types: Cigarettes    Quit date:  11/28/2013  . Smokeless tobacco: Former Systems developer    Quit date: 11/19/2013     Comment: smoked 1 ppd 30-35 years; quit in 2011  . Alcohol Use: 0.0 oz/week    0 Standard drinks or equivalent per week     Comment: occasional  . Drug Use: Yes    Special: Flunitrazepam  . Sexual Activity: Yes    Birth Control/  Protection: Surgical   Other Topics Concern  . Not on file   Social History Narrative   Patient exercises 3-4 times per week (walking x 20 minutes 3 days a week).    Marital status:  Married x 11 years; happily married; second marriage; no abuse.      Children:  2 children(31, 29); three grandsons.      Employment:  Long term disability through work; applying for disability in 2015.  Quit working 02/2013.      Tobacco:  1/2 ppd x 34 years.; quit smoking 11/2013.       Alcohol: rare;        Drugs: none      Exercise:  Rare       lives in [place].       Seatbelt:  100%       Guns: none   1-2 cups of coffee a day/ rare occasional Pepsi       ROS-see HPI   Negative unless "+" Constitutional:    weight loss, night sweats, fevers, chills, + fatigue, lassitude. HEENT:    headaches, difficulty swallowing, tooth/dental problems, sore throat,       sneezing, itching, ear ache, nasal congestion, post nasal drip, snoring CV:    chest pain, orthopnea, PND, swelling in lower extremities, anasarca,                                                     dizziness, palpitations Resp:   shortness of breath with exertion or at rest.                productive cough,   non-productive cough, coughing up of blood.              change in color of mucus.  wheezing.   Skin:    rash or lesions. GI:  No-   heartburn, indigestion, abdominal pain, nausea, vomiting, diarrhea,                 change in bowel habits, loss of appetite GU: dysuria, change in color of urine, no urgency or frequency.   flank pain. MS:   joint pain, stiffness, decreased range of motion, + back pain. Neuro-     nothing  unusual Psych:  change in mood or affect.  depression or anxiety.   memory loss.  OBJ- Physical Exam General- Alert, Oriented, Affect-appropriate, Distress- none acute, + obese with thick neck Skin- rash-none, lesions- none, excoriation- none Lymphadenopathy- none Head- atraumatic            Eyes- Gross vision intact, PERRLA, conjunctivae and secretions clear            Ears- Hearing, canals-normal            Nose- Clear, no-Septal dev, mucus, polyps, erosion, perforation             Throat- Mallampati III , mucosa clear , drainage- none, tonsils- atrophic Neck- flexible , trachea midline, no stridor , thyroid nl, carotid no bruit Chest - symmetrical excursion , unlabored           Heart/CV- RRR , no murmur , no gallop  , no rub, nl s1 s2                           -  JVD- none , edema- none, stasis changes- none, varices- none           Lung- clear to P&A, wheeze- none, cough- none , dullness-none, rub- none           Chest wall-  Abd-  Br/ Gen/ Rectal- Not done, not indicated Extrem- cyanosis- none, clubbing, none, atrophy- none, strength- nl Neuro- grossly intact to observation

## 2015-08-11 ENCOUNTER — Ambulatory Visit
Admission: RE | Admit: 2015-08-11 | Discharge: 2015-08-11 | Disposition: A | Discharge status: Home / Self Care | Payer: Commercial Managed Care - HMO | Source: Ambulatory Visit | Attending: Family Medicine | Admitting: Family Medicine

## 2015-08-11 DIAGNOSIS — Z1231 Encounter for screening mammogram for malignant neoplasm of breast: Secondary | ICD-10-CM | POA: Insufficient documentation

## 2015-08-13 DIAGNOSIS — Z0271 Encounter for disability determination: Secondary | ICD-10-CM

## 2015-08-13 DIAGNOSIS — J449 Chronic obstructive pulmonary disease, unspecified: Secondary | ICD-10-CM | POA: Insufficient documentation

## 2015-08-13 NOTE — Assessment & Plan Note (Signed)
Body habitus certainly suggests obstructive sleep apnea but apparently 2 sleep studies failed to show significant obstructive sleep apnea. Remote MSLT C[place] been consistent with narcolepsy but she had trouble tolerating stimulant medications. Sleep has been disturbed by somatic discomfort related to her cervical radiculopathy which restricts her to sleeping on her sides. There may be a limb jerk component with sleep disturbance. The best approach would be to try to get complete NPSG and MSLT. She is willing to try once more. She is willing to be off of Zanaflex for couple of days for this.

## 2015-08-13 NOTE — Telephone Encounter (Signed)
Payment received and records were sent on 08/13/15

## 2015-10-03 ENCOUNTER — Ambulatory Visit (HOSPITAL_BASED_OUTPATIENT_CLINIC_OR_DEPARTMENT_OTHER): Payer: Self-pay

## 2015-10-04 ENCOUNTER — Ambulatory Visit (HOSPITAL_BASED_OUTPATIENT_CLINIC_OR_DEPARTMENT_OTHER): Payer: Self-pay

## 2015-10-13 ENCOUNTER — Encounter (HOSPITAL_COMMUNITY): Payer: Self-pay | Admitting: Emergency Medicine

## 2015-10-13 ENCOUNTER — Emergency Department (HOSPITAL_COMMUNITY): Payer: Commercial Managed Care - HMO

## 2015-10-13 ENCOUNTER — Emergency Department (HOSPITAL_COMMUNITY)
Admission: EM | Admit: 2015-10-13 | Discharge: 2015-10-13 | Disposition: A | Discharge status: Home / Self Care | Payer: Commercial Managed Care - HMO | Attending: Emergency Medicine | Admitting: Emergency Medicine

## 2015-10-13 DIAGNOSIS — X501XXA Overexertion from prolonged static or awkward postures, initial encounter: Secondary | ICD-10-CM | POA: Insufficient documentation

## 2015-10-13 DIAGNOSIS — Z8619 Personal history of other infectious and parasitic diseases: Secondary | ICD-10-CM | POA: Diagnosis not present

## 2015-10-13 DIAGNOSIS — Z79899 Other long term (current) drug therapy: Secondary | ICD-10-CM | POA: Diagnosis not present

## 2015-10-13 DIAGNOSIS — Z862 Personal history of diseases of the blood and blood-forming organs and certain disorders involving the immune mechanism: Secondary | ICD-10-CM | POA: Diagnosis not present

## 2015-10-13 DIAGNOSIS — G43909 Migraine, unspecified, not intractable, without status migrainosus: Secondary | ICD-10-CM | POA: Insufficient documentation

## 2015-10-13 DIAGNOSIS — M810 Age-related osteoporosis without current pathological fracture: Secondary | ICD-10-CM | POA: Insufficient documentation

## 2015-10-13 DIAGNOSIS — Y998 Other external cause status: Secondary | ICD-10-CM | POA: Diagnosis not present

## 2015-10-13 DIAGNOSIS — S29002A Unspecified injury of muscle and tendon of back wall of thorax, initial encounter: Secondary | ICD-10-CM | POA: Insufficient documentation

## 2015-10-13 DIAGNOSIS — K219 Gastro-esophageal reflux disease without esophagitis: Secondary | ICD-10-CM | POA: Diagnosis not present

## 2015-10-13 DIAGNOSIS — Z8659 Personal history of other mental and behavioral disorders: Secondary | ICD-10-CM | POA: Insufficient documentation

## 2015-10-13 DIAGNOSIS — M1990 Unspecified osteoarthritis, unspecified site: Secondary | ICD-10-CM | POA: Diagnosis not present

## 2015-10-13 DIAGNOSIS — Z87891 Personal history of nicotine dependence: Secondary | ICD-10-CM | POA: Diagnosis not present

## 2015-10-13 DIAGNOSIS — Z8669 Personal history of other diseases of the nervous system and sense organs: Secondary | ICD-10-CM | POA: Diagnosis not present

## 2015-10-13 DIAGNOSIS — Y9389 Activity, other specified: Secondary | ICD-10-CM | POA: Diagnosis not present

## 2015-10-13 DIAGNOSIS — S29001A Unspecified injury of muscle and tendon of front wall of thorax, initial encounter: Secondary | ICD-10-CM | POA: Insufficient documentation

## 2015-10-13 DIAGNOSIS — E559 Vitamin D deficiency, unspecified: Secondary | ICD-10-CM | POA: Insufficient documentation

## 2015-10-13 DIAGNOSIS — Y9289 Other specified places as the place of occurrence of the external cause: Secondary | ICD-10-CM | POA: Insufficient documentation

## 2015-10-13 DIAGNOSIS — Z86010 Personal history of colonic polyps: Secondary | ICD-10-CM | POA: Diagnosis not present

## 2015-10-13 DIAGNOSIS — M546 Pain in thoracic spine: Secondary | ICD-10-CM

## 2015-10-13 DIAGNOSIS — R079 Chest pain, unspecified: Secondary | ICD-10-CM

## 2015-10-13 DIAGNOSIS — I10 Essential (primary) hypertension: Secondary | ICD-10-CM | POA: Insufficient documentation

## 2015-10-13 LAB — I-STAT CHEM 8, ED
BUN: 11 mg/dL (ref 6–20)
Calcium, Ion: 1.3 mmol/L — ABNORMAL HIGH (ref 1.12–1.23)
Chloride: 105 mmol/L (ref 101–111)
Creatinine, Ser: 0.6 mg/dL (ref 0.44–1.00)
Glucose, Bld: 132 mg/dL — ABNORMAL HIGH (ref 65–99)
HCT: 43 % (ref 36.0–46.0)
Hemoglobin: 14.6 g/dL (ref 12.0–15.0)
Potassium: 4 mmol/L (ref 3.5–5.1)
Sodium: 140 mmol/L (ref 135–145)
TCO2: 24 mmol/L (ref 0–100)

## 2015-10-13 LAB — CBC
HCT: 40 % (ref 36.0–46.0)
Hemoglobin: 13.3 g/dL (ref 12.0–15.0)
MCH: 29.4 pg (ref 26.0–34.0)
MCHC: 33.3 g/dL (ref 30.0–36.0)
MCV: 88.5 fL (ref 78.0–100.0)
Platelets: 170 10*3/uL (ref 150–400)
RBC: 4.52 MIL/uL (ref 3.87–5.11)
RDW: 13 % (ref 11.5–15.5)
WBC: 9.7 10*3/uL (ref 4.0–10.5)

## 2015-10-13 LAB — I-STAT TROPONIN, ED: Troponin i, poc: 0.01 ng/mL (ref 0.00–0.08)

## 2015-10-13 NOTE — ED Notes (Signed)
Per EMS, patient is from home with with complaints of chest pain that started this morning. She got up out of bed to stretch, and felt a "pop" in her chest, that radiates to neck and shoulders. She took 4 baby aspirin today prior to ems arrival. bp 210/120, p 80, o2 sat 95% on room air, rr 20. Reports pain 1/10. Previous neck surgery in 2012 on c5-t1 "front and back". Hx of htn and gerd.

## 2015-10-13 NOTE — Discharge Instructions (Signed)
Back Pain, Adult Back pain is very common in adults.The cause of back pain is rarely dangerous and the pain often gets better over time.The cause of your back pain may not be known. Some common causes of back pain include:  Strain of the muscles or ligaments supporting the spine.  Wear and tear (degeneration) of the spinal disks.  Arthritis.  Direct injury to the back. For many people, back pain may return. Since back pain is rarely dangerous, most people can learn to manage this condition on their own. HOME CARE INSTRUCTIONS Watch your back pain for any changes. The following actions may help to lessen any discomfort you are feeling:  Remain active. It is stressful on your back to sit or stand in one place for long periods of time. Do not sit, drive, or stand in one place for more than 30 minutes at a time. Take short walks on even surfaces as soon as you are able.Try to increase the length of time you walk each day.  Exercise regularly as directed by your health care provider. Exercise helps your back heal faster. It also helps avoid future injury by keeping your muscles strong and flexible.  Do not stay in bed.Resting more than 1-2 days can delay your recovery.  Pay attention to your body when you bend and lift. The most comfortable positions are those that put less stress on your recovering back. Always use proper lifting techniques, including:  Bending your knees.  Keeping the load close to your body.  Avoiding twisting.  Find a comfortable position to sleep. Use a firm mattress and lie on your side with your knees slightly bent. If you lie on your back, put a pillow under your knees.  Avoid feeling anxious or stressed.Stress increases muscle tension and can worsen back pain.It is important to recognize when you are anxious or stressed and learn ways to manage it, such as with exercise.  Take medicines only as directed by your health care provider. Over-the-counter  medicines to reduce pain and inflammation are often the most helpful.Your health care provider may prescribe muscle relaxant drugs.These medicines help dull your pain so you can more quickly return to your normal activities and healthy exercise.  Apply ice to the injured area:  Put ice in a plastic bag.  Place a towel between your skin and the bag.  Leave the ice on for 20 minutes, 2-3 times a day for the first 2-3 days. After that, ice and heat may be alternated to reduce pain and spasms.  Maintain a healthy weight. Excess weight puts extra stress on your back and makes it difficult to maintain good posture. SEEK MEDICAL CARE IF:  You have pain that is not relieved with rest or medicine.  You have increasing pain going down into the legs or buttocks.  You have pain that does not improve in one week.  You have night pain.  You lose weight.  You have a fever or chills. SEEK IMMEDIATE MEDICAL CARE IF:   You develop new bowel or bladder control problems.  You have unusual weakness or numbness in your arms or legs.  You develop nausea or vomiting.  You develop abdominal pain.  You feel faint.   This information is not intended to replace advice given to you by your health care provider. Make sure you discuss any questions you have with your health care provider.   Document Released: 07/03/2005 Document Revised: 07/24/2014 Document Reviewed: 11/04/2013 Elsevier Interactive Patient Education 2016 Elsevier  Inc[employer]  Nonspecific Chest Pain  Chest pain can be caused by many different conditions. There is always a chance that your pain could be related to something serious, such as a heart attack or a blood clot in your lungs. Chest pain can also be caused by conditions that are not life-threatening. If you have chest pain, it is very important to follow up with your health care provider. CAUSES  Chest pain can be caused by:  Heartburn.  Pneumonia or bronchitis.  Anxiety or  stress.  Inflammation around your heart (pericarditis) or lung (pleuritis or pleurisy).  A blood clot in your lung.  A collapsed lung (pneumothorax). It can develop suddenly on its own (spontaneous pneumothorax) or from trauma to the chest.  Shingles infection (varicella-zoster virus).  Heart attack.  Damage to the bones, muscles, and cartilage that make up your chest wall. This can include:  Bruised bones due to injury.  Strained muscles or cartilage due to frequent or repeated coughing or overwork.  Fracture to one or more ribs.  Sore cartilage due to inflammation (costochondritis). RISK FACTORS  Risk factors for chest pain may include:  Activities that increase your risk for trauma or injury to your chest.  Respiratory infections or conditions that cause frequent coughing.  Medical conditions or overeating that can cause heartburn.  Heart disease or family history of heart disease.  Conditions or health behaviors that increase your risk of developing a blood clot.  Having had chicken pox (varicella zoster). SIGNS AND SYMPTOMS Chest pain can feel like:  Burning or tingling on the surface of your chest or deep in your chest.  Crushing, pressure, aching, or squeezing pain.  Dull or sharp pain that is worse when you move, cough, or take a deep breath.  Pain that is also felt in your back, neck, shoulder, or arm, or pain that spreads to any of these areas. Your chest pain may come and go, or it may stay constant. DIAGNOSIS Lab tests or other studies may be needed to find the cause of your pain. Your health care provider may have you take a test called an ambulatory ECG (electrocardiogram). An ECG records your heartbeat patterns at the time the test is performed. You may also have other tests, such as:  Transthoracic echocardiogram (TTE). During echocardiography, sound waves are used to create a picture of all of the heart structures and to look at how blood flows  through your heart.  Transesophageal echocardiogram (TEE).This is a more advanced imaging test that obtains images from inside your body. It allows your health care provider to see your heart in finer detail.  Cardiac monitoring. This allows your health care provider to monitor your heart rate and rhythm in real time.  Holter monitor. This is a portable device that records your heartbeat and can help to diagnose abnormal heartbeats. It allows your health care provider to track your heart activity for several days, if needed.  Stress tests. These can be done through exercise or by taking medicine that makes your heart beat more quickly.  Blood tests.  Imaging tests. TREATMENT  Your treatment depends on what is causing your chest pain. Treatment may include:  Medicines. These may include:  Acid blockers for heartburn.  Anti-inflammatory medicine.  Pain medicine for inflammatory conditions.  Antibiotic medicine, if an infection is present.  Medicines to dissolve blood clots.  Medicines to treat coronary artery disease.  Supportive care for conditions that do not require medicines. This may include:  Resting.  Applying heat or cold packs to injured areas.  Limiting activities until pain decreases. HOME CARE INSTRUCTIONS  If you were prescribed an antibiotic medicine, finish it all even if you start to feel better.  Avoid any activities that bring on chest pain.  Do not use any tobacco products, including cigarettes, chewing tobacco, or electronic cigarettes. If you need help quitting, ask your health care provider.  Do not drink alcohol.  Take medicines only as directed by your health care provider.  Keep all follow-up visits as directed by your health care provider. This is important. This includes any further testing if your chest pain does not go away.  If heartburn is the cause for your chest pain, you may be told to keep your head raised (elevated) while sleeping.  This reduces the chance that acid will go from your stomach into your esophagus.  Make lifestyle changes as directed by your health care provider. These may include:  Getting regular exercise. Ask your health care provider to suggest some activities that are safe for you.  Eating a heart-healthy diet. A registered dietitian can help you to learn healthy eating options.  Maintaining a healthy weight.  Managing diabetes, if necessary.  Reducing stress. SEEK MEDICAL CARE IF:  Your chest pain does not go away after treatment.  You have a rash with blisters on your chest.  You have a fever. SEEK IMMEDIATE MEDICAL CARE IF:   Your chest pain is worse.  You have an increasing cough, or you cough up blood.  You have severe abdominal pain.  You have severe weakness.  You faint.  You have chills.  You have sudden, unexplained chest discomfort.  You have sudden, unexplained discomfort in your arms, back, neck, or jaw.  You have shortness of breath at any time.  You suddenly start to sweat, or your skin gets clammy.  You feel nauseous or you vomit.  You suddenly feel light-headed or dizzy.  Your heart begins to beat quickly, or it feels like it is skipping beats. These symptoms may represent a serious problem that is an emergency. Do not wait to see if the symptoms will go away. Get medical help right away. Call your local emergency services (911 in the [place] Do not drive yourself to the hospital.   This information is not intended to replace advice given to you by your health care provider. Make sure you discuss any questions you have with your health care provider.   Document Released: 04/12/2005 Document Revised: 07/24/2014 Document Reviewed: 02/06/2014 Elsevier Interactive Patient Education [employer].

## 2015-10-13 NOTE — ED Notes (Signed)
Dr. Pickering at the bedside.  

## 2015-10-13 NOTE — ED Provider Notes (Signed)
CSN: [id number]     Arrival date & time 10/13/15  2101 History   First MD Initiated Contact with Patient 10/13/15 2121     Chief Complaint  Patient presents with  . Chest Pain    Patient is a 55 y.o. female presenting with chest pain. The history is provided by the patient.  Chest Pain Associated symptoms: back pain, diaphoresis, numbness (chronic numbness) and shortness of breath   Associated symptoms: no abdominal pain   Patient was normal when she woke up this morning. States that when she was getting up she began to stretch and then began to feel a pop and pain in her back and anterior chest. States throughout the dates but now has been moderately severe. Worse with movements. States she also is feeling what felt like her previous GERD. States and also the medicine for that. She's had mild shortness of breath with it. States it hurts worse to lay down also. No fevers or chills. States she did get a little sweaty due to the pain. No cough. No abdominal pain. No lightheadedness or dizziness.  Past Medical History  Diagnosis Date  . Depression   . Narcolepsy     w/o cataplexy  . GERD (gastroesophageal reflux disease)   . HTN (hypertension)   . 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 Chicken pox     childhood  . Migraine, unspecified, without mention of intractable migraine without mention of status migrainosus   . Periodic limb movement disorder   . Nonspecific abnormal electrocardiogram (ECG) (EKG)   . Diaphragmatic hernia without mention of obstruction or gangrene   . Tobacco use disorder   . Barrett's esophagus   . Personal history of colonic polyps   . Allergic rhinitis, cause unspecified   . Insomnia, unspecified   . Unspecified vitamin D deficiency   . Other abnormal glucose   . Anemia   . Allergy   . Osteoporosis   . Fibromyalgia    Past Surgical History  Procedure Laterality Date  . Cesarean section      x2  1984  and 1986  . Pilonidal cyst resection  1992  . Cholecystectomy    . Carpal tunnel release - bilateral    . Dilation and curettage of uterus      SAB  . Tubel ligation  2002  . Fusion of cervical spine  2007    C5,C6,C7, and T1  . Laprascopy  2009    for RLQ pain:R oophorectomy performed no improvement in pain.   . Vaginal hysterectomy  2009    fibroid/endometriosis, one L ovary remaining  . R shoulder surgery  06/24/09    2 tears in rotator cuff,bone spurs. Dr. Modena Morrow  . 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 Spine surgery  09/2011    Cervical spine fusion  . Back surgery    . Tubal ligation    . Colonoscopy  03/17/2012    [facility]  . Esophagogastroduodenoscopy  03/17/2012    no Barrett's esophagus. [facility].  Marland Kitchen Abdominal hysterectomy      fibroids and endometriosis.  ovarian resection R.  . Breast excisional biopsy  2005     R   Benign   Family History  Problem Relation Age of Onset  . Bipolar disorder Mother   . Cancer Mother     bladder  . Hypertension Mother   .  Colon polyps Mother   . Osteoarthritis Mother   . Diabetes Mother   . Thyroid disease Mother   . Heart disease Father 24    CAD/AMI with stenting/PAD.  Marland Kitchen Hyperlipidemia Father   . Cancer Father     skin  . Angina Father   . Asthma Daughter   . Cancer Maternal Grandmother     colon  . Heart disease Maternal Grandmother   . Cancer Maternal Grandfather     lung  . Parkinson's disease Maternal Grandfather   . Stroke Maternal Grandfather   . Diabetes Maternal Grandfather   . Hyperlipidemia Maternal Grandfather   . Rheum arthritis Paternal Grandmother   . Heart attack Paternal Grandfather    Social History  Substance Use Topics  . Smoking status: Former Smoker -- 1.00 packs/day for 36 years    Types: Cigarettes    Quit date: 11/28/2013  . Smokeless tobacco: Former Systems developer    Quit date: 11/19/2013     Comment: smoked 1 ppd 30-35 years; quit in 2011  . Alcohol Use: No   OB History    No  data available     Review of Systems  Constitutional: Positive for diaphoresis. Negative for appetite change.  Respiratory: Positive for shortness of breath.   Cardiovascular: Positive for chest pain.  Gastrointestinal: Negative for abdominal pain.  Musculoskeletal: Positive for back pain.  Skin: Negative for color change and wound.  Neurological: Positive for numbness (chronic numbness).      Allergies  Duloxetine hcl; Mirtazapine; Serotonin reuptake inhibitors (ssris); St johns wort; Bupropion hcl; Celebrex; Effexor; Lyrica; Methylphenidate hcl; Mirapex; Modafinil; Rabeprazole sodium; and Lisinopril  Home Medications   Prior to Admission medications   Medication Sig Start Date End Date Taking? Authorizing Provider  amLODipine (NORVASC) 10 MG tablet Take 1 tablet (10 mg total) by mouth daily. 04/19/15  Yes Wardell Honour, MD  cetirizine (ZYRTEC) 10 MG tablet Take 10 mg by mouth daily.     Yes Historical Provider, MD  Cholecalciferol (VITAMIN D3) 2000 UNITS TABS Take 1 tablet by mouth daily.    Yes Historical Provider, MD  Dexlansoprazole (DEXILANT) 30 MG capsule Take 30 mg by mouth daily.   Yes Historical Provider, MD  HYDROcodone-acetaminophen (NORCO/VICODIN) 5-325 MG per tablet Take 1 tablet by mouth every 6 (six) hours as needed for moderate pain. 04/05/15  Yes Wardell Honour, MD  HYDROmorphone (DILAUDID) 2 MG tablet Take 2 mg by mouth every 4 (four) hours as needed for severe pain.    Yes Historical Provider, MD  hyoscyamine (ANASPAZ) 0.125 MG TBDP Place 0.125-0.25 mg under the tongue every 6 (six) hours as needed for cramping. For stomach cramps   Yes Historical Provider, MD  losartan (COZAAR) 100 MG tablet Take 1 tablet (100 mg total) by mouth daily. 05/09/15  Yes Wardell Honour, MD  montelukast (SINGULAIR) 10 MG tablet take 1 tablet by mouth at bedtime 06/23/15  Yes Wardell Honour, MD  Multiple Vitamin (MULTIVITAMIN) tablet Take 1 tablet by mouth daily.   Yes Historical Provider,  MD  Omega-3 Fatty Acids (FISH OIL PO) Take 1,200 mg by mouth daily.   Yes Historical Provider, MD  propranolol (INDERAL) 20 MG tablet take 3 tablets by mouth twice a day 04/20/15  Yes Wardell Honour, MD  psyllium (HYDROCIL/METAMUCIL) 95 % PACK Take 1 packet by mouth daily as needed for mild constipation.    Yes Historical Provider, MD  tiZANidine (ZANAFLEX) 4 MG capsule take 1 capsule by mouth  every evening 07/23/15   Wardell Honour, MD   BP 137/74 mmHg  Pulse 77  Temp(Src) 98.2 F (36.8 C) (Oral)  Resp 16  Ht 5' 2"  (1.575 m)  Wt 215 lb (97.523 kg)  BMI 39.31 kg/m2  SpO2 94% Physical Exam  Constitutional: She is oriented to person, place, and time. She appears well-developed and well-nourished.  HENT:  Head: Normocephalic and atraumatic.  Eyes: Pupils are equal, round, and reactive to light.  Neck: Normal range of motion.  Cardiovascular: Normal rate and regular rhythm.   Pulmonary/Chest: Effort normal and breath sounds normal. No respiratory distress. She has no wheezes. She has no rales.  Abdominal: Soft. Bowel sounds are normal. She exhibits no distension. There is no tenderness. There is no rebound and no guarding.  Musculoskeletal: Normal range of motion. She exhibits tenderness.  Tenderness over mid thoracic spine. Cervical spine shows scars from previous surgery posteriorly.  Neurological: She is alert and oriented to person, place, and time. No cranial nerve deficit.  Skin: Skin is warm and dry.  Psychiatric: She has a normal mood and affect. Her speech is normal.  Nursing note and vitals reviewed.   ED Course  Procedures (including critical care time) Labs Review Labs Reviewed  I-STAT CHEM 8, ED - Abnormal; Notable for the following:    Glucose, Bld 132 (*)    Calcium, Ion 1.30 (*)    All other components within normal limits  CBC  I-STAT TROPOININ, ED    Imaging Review Dg Chest 2 View  10/13/2015  CLINICAL DATA:  Chest pain for 1 day EXAM: CHEST  2 VIEW  COMPARISON:  06/08/2014 FINDINGS: Enlargement of cardiac silhouette. Mediastinal contours and pulmonary vascularity normal. Minimal central peribronchial thickening. RIGHT basilar scarring. Subsegmental atelectasis LEFT base. Lungs otherwise clear. No pleural effusion or pneumothorax. Prior cervical thoracic fusion IMPRESSION: Enlargement of cardiac silhouette. Bronchitic changes with RIGHT basilar scarring and LEFT basilar subsegmental atelectasis. Electronically Signed   By: Lavonia Dana M.D.   On: 10/13/2015 21:54   I have personally reviewed and evaluated these images and lab results as part of my medical decision-making.   EKG Interpretation   Date/Time:  Wednesday October 13 2015 21:17:25 EDT Ventricular Rate:  75 PR Interval:  155 QRS Duration: 91 QT Interval:  381 QTC Calculation: 425 R Axis:   70 Text Interpretation:  Sinus rhythm No significant change since last  tracing Confirmed by Shatika Grinnell  MD, Ovid Curd [phone]) on 10/13/2015 9:47:11 PM      MDM   Final diagnoses:  Chest pain, unspecified chest pain type  Thoracic back pain, unspecified back pain laterality    Patient with chest pain. Likely musculoskeletal since began with a popliteal she was moving. EKG x-ray and lab work reassuring. Will discharge home.    Davonna Belling, MD 10/14/15 [phone]

## 2015-10-13 NOTE — ED Notes (Signed)
Patient gone to xray

## 2015-10-17 ENCOUNTER — Ambulatory Visit (HOSPITAL_BASED_OUTPATIENT_CLINIC_OR_DEPARTMENT_OTHER): Payer: Self-pay

## 2015-10-18 ENCOUNTER — Ambulatory Visit (HOSPITAL_BASED_OUTPATIENT_CLINIC_OR_DEPARTMENT_OTHER): Payer: Self-pay

## 2015-10-19 ENCOUNTER — Ambulatory Visit (HOSPITAL_BASED_OUTPATIENT_CLINIC_OR_DEPARTMENT_OTHER): Payer: Commercial Managed Care - HMO | Attending: Internal Medicine | Admitting: Radiology

## 2015-10-19 ENCOUNTER — Ambulatory Visit (HOSPITAL_BASED_OUTPATIENT_CLINIC_OR_DEPARTMENT_OTHER): Payer: Self-pay

## 2015-10-19 VITALS — Ht 62.0 in | Wt 215.0 lb

## 2015-10-19 DIAGNOSIS — G4700 Insomnia, unspecified: Secondary | ICD-10-CM | POA: Insufficient documentation

## 2015-10-19 DIAGNOSIS — G4710 Hypersomnia, unspecified: Secondary | ICD-10-CM

## 2015-10-19 DIAGNOSIS — G4736 Sleep related hypoventilation in conditions classified elsewhere: Secondary | ICD-10-CM | POA: Insufficient documentation

## 2015-10-19 DIAGNOSIS — G4730 Sleep apnea, unspecified: Secondary | ICD-10-CM | POA: Diagnosis not present

## 2015-10-19 DIAGNOSIS — G4719 Other hypersomnia: Secondary | ICD-10-CM | POA: Insufficient documentation

## 2015-10-19 DIAGNOSIS — G4761 Periodic limb movement disorder: Secondary | ICD-10-CM | POA: Insufficient documentation

## 2015-10-19 DIAGNOSIS — G47419 Narcolepsy without cataplexy: Secondary | ICD-10-CM | POA: Diagnosis present

## 2015-10-19 DIAGNOSIS — G4711 Idiopathic hypersomnia with long sleep time: Secondary | ICD-10-CM | POA: Diagnosis not present

## 2015-10-19 DIAGNOSIS — R0683 Snoring: Secondary | ICD-10-CM | POA: Diagnosis not present

## 2015-10-20 ENCOUNTER — Ambulatory Visit (HOSPITAL_BASED_OUTPATIENT_CLINIC_OR_DEPARTMENT_OTHER): Payer: Commercial Managed Care - HMO | Attending: Internal Medicine | Admitting: Radiology

## 2015-10-20 DIAGNOSIS — G4710 Hypersomnia, unspecified: Secondary | ICD-10-CM

## 2015-10-20 DIAGNOSIS — G4761 Periodic limb movement disorder: Secondary | ICD-10-CM | POA: Diagnosis not present

## 2015-10-21 ENCOUNTER — Other Ambulatory Visit: Payer: Self-pay | Admitting: Family Medicine

## 2015-10-23 NOTE — Progress Notes (Signed)
     Patient Name: Emily Melton, Emily Melton Date: 10/20/2015 Gender: Female D.O.B: March 03, 1961 Age (years): 47 Referring Provider: Baird Lyons MD, ABSM Height (inches): 62 Interpreting Physician: Baird Lyons MD, ABSM Weight (lbs): 215 RPSGT: Jacolyn Reedy BMI: 39 MRN: [id number] Neck Size: 18.00  CLINICAL INFORMATION Sleep Study Type: MSLT This study was done following a NPSG 10/19/15, done off medication.  Total sleep time 333 minutes, with latency to sleep onset 79 minutes, sleep efficiency 41.9%, indicating insomnia.Marland Kitchen REM latency 295 minutes, AHI  2.4/ hr. Periodic Limb Movement with arousal 6.3/ hr The patient was referred to the sleep center for evaluation of daytime sleepiness.  Epworth Sleepiness Score: 13/24  SLEEP STUDY TECHNIQUE A Multiple Sleep Latency Test was performed after an overnight polysomnogram according to the AASM scoring manual v2.3 (April 2016) and clinical guidelines. Five nap opportunities occurred over the course of the test which followed an overnight polysomnogram. The channels recorded and monitored were frontal, central, and occipital electroencephalography (EEG), right and left electrooculogram (EOG), chin electromyography (EMG), and electrocardiogram (EKG).  MEDICATIONS Medications taken by the patient : home meds charted for review Medications administered by patient during sleep study : No sleep medicine administered.  IMPRESSIONS - Total number of naps attempted: 5.00 . Total number of naps with sleep attained: . The Mean Sleep Latency was 03:43 minutes. There were no sleep-onset REM periods. - The patient appears to have pathologic sleepiness, evidenced by a short mean sleep latency (8 minutes or less) on this MSLT. - The previous sleep night recorded by NPSG was short, sleep disturbed by insomnia and limb jerks. - No sleep onset REMs were noted during this MSLT. 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 Narcolepsy cannot be ruled out.  DIAGNOSIS - Pathologic Sleepiness (- [G47.10 ICD-10]) - Idiopathic hypersomnia (327.11 [G47.11 ICD-10])  RECOMMENDATIONS - Return to provider for follow up and management of Idiopathic Hypersomnia. in the context of Insomnia and Periodic Limb Movement Syndrome - Return for follow up to evaluate other causes of excessive daytime sleepiness.  Deneise Lever Diplomate, American Board of Sleep Medicine  ELECTRONICALLY SIGNED ON:  10/23/2015, 3:33 PM [address] PH: [phone]   FX: [phone] [place]

## 2015-10-23 NOTE — Progress Notes (Signed)
  Patient Name: Emily Melton, Emily Melton Date: 10/19/2015 Gender: Female D.O.B: 04-02-1961 Age (years): 79 Referring Provider: Baird Lyons MD, ABSM Height (inches): 62 Interpreting Physician: Baird Lyons MD, ABSM Weight (lbs): 215 RPSGT: Zadie Rhine BMI: 39 MRN: [id number] Neck Size: 18.00 CLINICAL INFORMATION Sleep Study Type: NPSG   Indication for sleep study: Obstructive sleep apnea vs Narcolepsy   Epworth Sleepiness Score: 13/24   SLEEP STUDY TECHNIQUE As per the AASM Manual for the Scoring of Sleep and Associated Events v2.3 (April 2016) with a hypopnea requiring 4% desaturations. The channels recorded and monitored were frontal, central and occipital EEG, electrooculogram (EOG), submentalis EMG (chin), nasal and oral airflow, thoracic and abdominal wall motion, anterior tibialis EMG, snore microphone, electrocardiogram, and pulse oximetry.  MEDICATIONS Patient's medications include:charted for review Medications self-administered by patient during sleep study : No medicine administered.  SLEEP ARCHITECTURE The study was initiated at 11:40:46 PM and ended at 6:32:48 AM. Sleep onset time was 78.9 minutes and the sleep efficiency was 41.9%. The total sleep time was 172.5 minutes. Stage REM latency was 295.0 minutes. The patient spent 12.46% of the night in stage N1 sleep, 58.55% in stage N2 sleep, 25.80% in stage N3 and 3.19% in REM. Alpha intrusion was absent. Supine sleep was 0.00%. Wake after sleep onset: 160.7 minutes, described as restless  RESPIRATORY PARAMETERS The overall apnea/hypopnea index (AHI) was 2.4 per hour. There were 0 total apneas, including 0 obstructive, 0 central and 0 mixed apneas. There were 7 hypopneas and 24 RERAs. The AHI during Stage REM sleep was 0.0 per hour. AHI while supine was N/A per hour. The mean oxygen saturation was 91.06%. The minimum SpO2 during sleep was 81.00%. Moderate snoring was noted during this study.  CARDIAC DATA The 2  lead EKG demonstrated sinus rhythm. The mean heart rate was 67.37 beats per minute. Other EKG findings include: None.  LEG MOVEMENT DATA The total PLMS were 150 with a resulting PLMS index of 52.17. Associated arousal with leg movement index was 6.3 .  IMPRESSIONS - No significant obstructive sleep apnea occurred during this study (AHI = 2.4/h). - No significant central sleep apnea occurred during this study (CAI = 0.0/h). - Mild oxygen desaturation was noted during this study (Min O2 = 81.00%). - The patient snored with Moderate snoring volume. - Difficulty initiating and maintaining sleep - No cardiac abnormalities were noted during this study. - Severe periodic limb movements of sleep occurred during the study. Associated arousals were significant- 6.3/ hr  DIAGNOSIS - Nocturnal Hypoxemia (327.26 [G47.36 ICD-10]) - Difficulty initiating and maintaining sleep- Insomnia - Periodic Limb Movement Syndrome (327.51 [G47.61 ICD-10])  RECOMMENDATIONS - Avoid alcohol, sedatives and other CNS depressants that may worsen sleep apnea and disrupt normal sleep architecture. - Consider management of limb movement sleep discorder - Sleep hygiene should be reviewed to assess factors that may improve sleep quality. - Weight management and regular exercise should be initiated or continued if appropriate. - See result of MSLT done following this study.   Deneise Lever Diplomate, American Board of Sleep Medicine  ELECTRONICALLY SIGNED ON:  10/23/2015, 3:13 PM [place] PH: [phone]   FX: [phone] [place]

## 2015-11-22 ENCOUNTER — Other Ambulatory Visit: Payer: Self-pay | Admitting: Family Medicine

## 2015-11-30 ENCOUNTER — Ambulatory Visit (INDEPENDENT_AMBULATORY_CARE_PROVIDER_SITE_OTHER): Payer: 59 | Admitting: Family Medicine

## 2015-11-30 ENCOUNTER — Encounter: Payer: Self-pay | Admitting: Family Medicine

## 2015-11-30 VITALS — BP 140/78 | HR 73 | Temp 98.7°F | Resp 16 | Ht 62.0 in | Wt 216.8 lb

## 2015-11-30 DIAGNOSIS — R7989 Other specified abnormal findings of blood chemistry: Secondary | ICD-10-CM

## 2015-11-30 DIAGNOSIS — G4710 Hypersomnia, unspecified: Secondary | ICD-10-CM

## 2015-11-30 DIAGNOSIS — R7302 Impaired glucose tolerance (oral): Secondary | ICD-10-CM | POA: Diagnosis not present

## 2015-11-30 DIAGNOSIS — R945 Abnormal results of liver function studies: Secondary | ICD-10-CM

## 2015-11-30 DIAGNOSIS — E7800 Pure hypercholesterolemia, unspecified: Secondary | ICD-10-CM | POA: Diagnosis not present

## 2015-11-30 DIAGNOSIS — I10 Essential (primary) hypertension: Secondary | ICD-10-CM | POA: Diagnosis not present

## 2015-11-30 MED ORDER — HYDROCHLOROTHIAZIDE 12.5 MG PO TABS: 12.5000 mg | ORAL_TABLET | Freq: Every day | ORAL | Status: DC

## 2015-11-30 NOTE — Progress Notes (Signed)
Subjective:    Patient ID: Emily Melton, female    DOB: 10-10-1960, 55 y.o.   MRN: [id number]  11/30/2015  Hypertension and Hyperlipidemia   HPI HTN:  Home readings ranging 120-160/68-80.  Having more pain.    Chest pain: presented to ED on 10/13/2015; stretched hard in back; heard a pop; a little bit later, developed substernal chest pain especially with deep breathing.  Then radiated into jaw, shoulders, arms.  No SOB, diaphoresis, nausea.  Took antacid OTC; symptoms had improved gradually.  Unable to lay down.  S/p CXR during ED visit; cardiac enlargement with B bibasilar atelectasis.  Troponin negative.  CMET negative.  CBC WNL.  No recurrence since event.    Hypersomnolence:  S/p sleep study and MSLT; had a really difficult time maintaining sleep; advised to avoid alcohol, sedatives, or other CNS depressants.  Consider management for limb movement sleep disorder.  Patient sleeps quite well with muscle relaxer.  S/p MSLT; +idiopathic hypersomnolence. Cannot tolerate Mirapex.  Never tried Relpax.    Skin lesions: s/p cryotherapy of R forearm; had a boil on leg; placed on Doxycyline for two weeks.    R big toe: having horrible pain; not walking on first toe so has off set walking and lower back and knee.  If moves foot back and forth, foot cramps severely.  Having R sharp knee pain at times especially while riding bike. Also having R hip pian.  Also having lower back pain.  Not performing home exercises.  Not against physical therapy.  No n/t/w. No saddle paresthesias.  No b/b function.  Not followed by ortho at this point; not seeing NS.  Cannot tolerate Neurontin.  DOE: to undergo pulmonary function tests when follows up with pulmonology next week; s/p sleep study.    Review of Systems  Constitutional: Positive for fatigue. Negative for fever, chills and diaphoresis.  Eyes: Negative for visual disturbance.  Respiratory: Positive for shortness of breath. Negative for cough.     Cardiovascular: Negative for chest pain, palpitations and leg swelling.  Gastrointestinal: Negative for nausea, vomiting, abdominal pain, diarrhea and constipation.  Endocrine: Negative for cold intolerance, heat intolerance, polydipsia, polyphagia and polyuria.  Musculoskeletal: Positive for myalgias, joint swelling and arthralgias.  Neurological: Negative for dizziness, tremors, seizures, syncope, facial asymmetry, speech difficulty, weakness, light-headedness, numbness and headaches.  Psychiatric/Behavioral: Positive for dysphoric mood. Negative for suicidal ideas, sleep disturbance and self-injury. The patient is nervous/anxious.     Past Medical History  Diagnosis Date  . Depression   . Narcolepsy     w/o cataplexy  . GERD (gastroesophageal reflux disease)   . HTN (hypertension)   . 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 Chicken pox     childhood  . Migraine, unspecified, without mention of intractable migraine without mention of status migrainosus   . Periodic limb movement disorder   . Nonspecific abnormal electrocardiogram (ECG) (EKG)   . Diaphragmatic hernia without mention of obstruction or gangrene   . Tobacco use disorder   . Barrett's esophagus   . Personal history of colonic polyps   . Allergic rhinitis, cause unspecified   . Insomnia, unspecified   . Unspecified vitamin D deficiency   . Other abnormal glucose   . Anemia   . Allergy   . Osteoporosis   . Fibromyalgia    Past Surgical History  Procedure Laterality Date  . Cesarean section  x2  1984 and 1986  . Pilonidal cyst resection  1992  . Cholecystectomy    . Carpal tunnel release - bilateral    . Dilation and curettage of uterus      SAB  . Tubel ligation  2002  . Fusion of cervical spine  2007    C5,C6,C7, and T1  . Laprascopy  2009    for RLQ pain:R oophorectomy performed no improvement in pain.   . Vaginal hysterectomy  2009     fibroid/endometriosis, one L ovary remaining  . R shoulder surgery  06/24/09    2 tears in rotator cuff,bone spurs. Dr. Modena Morrow  . 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 Spine surgery  09/2011    Cervical spine fusion  . Back surgery    . Tubal ligation    . Colonoscopy  03/17/2012    [facility]  . Esophagogastroduodenoscopy  03/17/2012    no Barrett's esophagus. [facility].  Marland Kitchen Abdominal hysterectomy      fibroids and endometriosis.  ovarian resection R.  . Breast excisional biopsy  2005     R   Benign   Allergies  Allergen Reactions  . Duloxetine Hcl Other (See Comments)    Hypertension, mood changes  . Mirtazapine Swelling  . Serotonin Reuptake Inhibitors (Ssris) Other (See Comments)    Hypertension, mood changes  . St Johns Wort Anaphylaxis  . Bupropion Hcl Other (See Comments)    Mental changes and high blood pressure   . Celebrex [Celecoxib] Hypertension  . Effexor [Venlafaxine] Other (See Comments)    Moodiness, vivid dreams, poor sleep cycle.  Recardo Evangelist [Pregabalin] Other (See Comments) and Hypertension    Mood changes, eye twitching and UTI  . Methylphenidate Hcl Other (See Comments)    unknown  . Mirapex [Pramipexole]     HTN, hallucinations and nausea  . Modafinil Swelling and Hypertension  . Rabeprazole Sodium Swelling and Other (See Comments)    Headaches   . Lisinopril Rash    Hypertension    Social History   Social History  . Marital Status: Married    Spouse Name: N/A  . Number of Children: 2  . Years of Education: High Schol   Occupational History  . Disabled    Social History Main Topics  . Smoking status: Former Smoker -- 1.00 packs/day for 36 years    Types: Cigarettes    Quit date: 11/28/2013  . Smokeless tobacco: Former Systems developer    Quit date: 11/19/2013     Comment: smoked 1 ppd 30-35 years; quit in 2011  . Alcohol Use: No  . Drug Use: Yes    Special: Flunitrazepam  . Sexual Activity: Yes    Birth Control/ Protection: Surgical    Other Topics Concern  . Not on file   Social History Narrative   Patient exercises 3-4 times per week (walking x 20 minutes 3 days a week).    Marital status:  Married x 11 years; happily married; second marriage; no abuse.      Children:  2 children(31, 29); three grandsons.      Employment:  Long term disability through work; applying for disability in 2015.  Quit working 02/2013.      Tobacco:  1/2 ppd x 34 years.; quit smoking 11/2013.       Alcohol: rare;        Drugs: none      Exercise:  Rare       lives  in [place].       Seatbelt:  100%       Guns: none   1-2 cups of coffee a day/ rare occasional Pepsi       Family History  Problem Relation Age of Onset  . Bipolar disorder Mother   . Cancer Mother     bladder  . Hypertension Mother   . Colon polyps Mother   . Osteoarthritis Mother   . Diabetes Mother   . Thyroid disease Mother   . Heart disease Father 62    CAD/AMI with stenting/PAD.  Marland Kitchen Hyperlipidemia Father   . Cancer Father     skin  . Angina Father   . Asthma Daughter   . Cancer Maternal Grandmother     colon  . Heart disease Maternal Grandmother   . Cancer Maternal Grandfather     lung  . Parkinson's disease Maternal Grandfather   . Stroke Maternal Grandfather   . Diabetes Maternal Grandfather   . Hyperlipidemia Maternal Grandfather   . Rheum arthritis Paternal Grandmother   . Heart attack Paternal Grandfather   . Pulmonary fibrosis Paternal Aunt   . Pulmonary fibrosis Paternal Uncle        Objective:    BP 140/78 mmHg  Pulse 73  Temp(Src) 98.7 F (37.1 C) (Oral)  Resp 16  Ht 5' 2"  (1.575 m)  Wt 216 lb 12.8 oz (98.34 kg)  BMI 39.64 kg/m2 Physical Exam  Constitutional: She is oriented to person, place, and time. She appears well-developed and well-nourished. No distress.  HENT:  Head: Normocephalic and atraumatic.  Right Ear: External ear normal.  Left Ear: External ear normal.  Nose: Nose normal.  Mouth/Throat: Oropharynx is clear  and moist.  Eyes: Conjunctivae and EOM are normal. Pupils are equal, round, and reactive to light.  Neck: Normal range of motion. Neck supple. Carotid bruit is not present. No thyromegaly present.  Cardiovascular: Normal rate, regular rhythm, normal heart sounds and intact distal pulses.  Exam reveals no gallop and no friction rub.   No murmur heard. Pulmonary/Chest: Effort normal and breath sounds normal. She has no wheezes. She has no rales.  Abdominal: Soft. Bowel sounds are normal. She exhibits no distension and no mass. There is no tenderness. There is no rebound and no guarding.  Lymphadenopathy:    She has no cervical adenopathy.  Neurological: She is alert and oriented to person, place, and time. No cranial nerve deficit.  Skin: Skin is warm and dry. No rash noted. She is not diaphoretic. No erythema. No pallor.  Psychiatric: She has a normal mood and affect. Her behavior is normal.        Assessment & Plan:   1. Essential hypertension, benign   2. Pure hypercholesterolemia   3. Glucose intolerance (impaired glucose tolerance)   4. Hypersomnolence   5. Elevated LFTs     Orders Placed This Encounter  Procedures  . Comprehensive metabolic panel    Order Specific Question:  Has the patient fasted?    Answer:  Yes  . Lipid panel    Order Specific Question:  Has the patient fasted?    Answer:  Yes  . Acute Hep Panel & Hep B Surface Ab  . Ambulatory referral to Gastroenterology    Referral Priority:  Routine    Referral Type:  Consultation    Referral Reason:  Specialty Services Required    Requested Specialty:  Gastroenterology    Number of Visits Requested:  1  Meds ordered this encounter  Medications  . hydrochlorothiazide (HYDRODIURIL) 12.5 MG tablet    Sig: Take 1 tablet (12.5 mg total) by mouth daily.    Dispense:  90 tablet    Refill:  1    Return in about 6 months (around 06/01/2016) for  complete physical examiniation.    Liban Guedes Elayne Guerin,  M.D. [facility] [address] [place], [place]  [phone] phone [phone] fax

## 2015-11-30 NOTE — Patient Instructions (Signed)
     IF you received an x-ray today, you will receive an invoice from [facility]. Please contact [facility] at [phone] with questions or concerns regarding your invoice.   IF you received labwork today, you will receive an invoice from [facility]. Please contact [facility] at [phone] with questions or concerns regarding your invoice.   Our billing staff will not be able to assist you with questions regarding bills from these companies.  You will be contacted with the lab results as soon as they are available. The fastest way to get your results is to activate your My Chart account. Instructions are located on the last page of this paperwork. If you have not heard from us regarding the results in 2 weeks, please contact this office.      

## 2015-12-01 LAB — COMPREHENSIVE METABOLIC PANEL
ALT: 48 U/L — ABNORMAL HIGH (ref 6–29)
AST: 37 U/L — ABNORMAL HIGH (ref 10–35)
Albumin: 4.5 g/dL (ref 3.6–5.1)
Alkaline Phosphatase: 81 U/L (ref 33–130)
BUN: 13 mg/dL (ref 7–25)
CO2: 24 mmol/L (ref 20–31)
Calcium: 10.5 mg/dL — ABNORMAL HIGH (ref 8.6–10.4)
Chloride: 104 mmol/L (ref 98–110)
Creat: 0.78 mg/dL (ref 0.50–1.05)
Glucose, Bld: 120 mg/dL — ABNORMAL HIGH (ref 65–99)
Potassium: 4.3 mmol/L (ref 3.5–5.3)
Sodium: 139 mmol/L (ref 135–146)
Total Bilirubin: 0.6 mg/dL (ref 0.2–1.2)
Total Protein: 6.9 g/dL (ref 6.1–8.1)

## 2015-12-01 LAB — ACUTE HEP PANEL AND HEP B SURFACE AB
HCV Ab: NEGATIVE
Hep A IgM: NONREACTIVE
Hep B C IgM: NONREACTIVE
Hep B S Ab: NEGATIVE
Hepatitis B Surface Ag: NEGATIVE

## 2015-12-01 LAB — LIPID PANEL
Cholesterol: 189 mg/dL (ref 125–200)
HDL: 52 mg/dL (ref >=46)
LDL Cholesterol: 103 mg/dL (ref <130)
Total CHOL/HDL Ratio: 3.6 Ratio (ref <=5.0)
Triglycerides: 172 mg/dL — ABNORMAL HIGH (ref <150)
VLDL: 34 mg/dL — ABNORMAL HIGH (ref <30)

## 2015-12-08 ENCOUNTER — Ambulatory Visit (INDEPENDENT_AMBULATORY_CARE_PROVIDER_SITE_OTHER): Payer: Commercial Managed Care - HMO | Admitting: Internal Medicine

## 2015-12-08 ENCOUNTER — Encounter: Payer: Self-pay | Admitting: Internal Medicine

## 2015-12-08 VITALS — BP 144/84 | HR 64 | Ht 62.0 in | Wt 217.0 lb

## 2015-12-08 DIAGNOSIS — J449 Chronic obstructive pulmonary disease, unspecified: Secondary | ICD-10-CM

## 2015-12-08 DIAGNOSIS — G4761 Periodic limb movement disorder: Secondary | ICD-10-CM | POA: Diagnosis not present

## 2015-12-08 DIAGNOSIS — G4710 Hypersomnia, unspecified: Secondary | ICD-10-CM

## 2015-12-08 DIAGNOSIS — Z87891 Personal history of nicotine dependence: Secondary | ICD-10-CM

## 2015-12-08 DIAGNOSIS — R0609 Other forms of dyspnea: Secondary | ICD-10-CM

## 2015-12-08 DIAGNOSIS — R0600 Dyspnea, unspecified: Secondary | ICD-10-CM

## 2015-12-08 LAB — PULMONARY FUNCTION TEST
FEF 25-75 Post: 2.17 L/sec
FEF 25-75 Pre: 1.54 L/sec
FEF2575-%Change-Post: 40 %
FEF2575-%Pred-Post: 86 %
FEF2575-%Pred-Pre: 61 %
FEV1-%Change-Post: 7 %
FEV1-%Pred-Post: 72 %
FEV1-%Pred-Pre: 67 %
FEV1-Post: 1.82 L
FEV1-Pre: 1.7 L
FEV1FVC-%Change-Post: 0 %
FEV1FVC-%Pred-Pre: 99 %
FEV6-%Change-Post: 6 %
FEV6-%Pred-Post: 73 %
FEV6-%Pred-Pre: 69 %
FEV6-Post: 2.31 L
FEV6-Pre: 2.16 L
FEV6FVC-%Change-Post: 0 %
FEV6FVC-%Pred-Post: 102 %
FEV6FVC-%Pred-Pre: 103 %
FVC-%Change-Post: 7 %
FVC-%Pred-Post: 71 %
FVC-%Pred-Pre: 66 %
FVC-Post: 2.31 L
FVC-Pre: 2.16 L
Post FEV1/FVC ratio: 79 %
Post FEV6/FVC ratio: 100 %
Pre FEV1/FVC ratio: 79 %
Pre FEV6/FVC Ratio: 100 %
RV % pred: 102 %
RV: 1.8 L
TLC % pred: 90 %
TLC: 4.3 L

## 2015-12-08 MED ORDER — CLONAZEPAM 0.5 MG PO TABS: ORAL_TABLET | ORAL | Status: DC

## 2015-12-08 NOTE — Assessment & Plan Note (Signed)
Zanaflex helps calm limb movements. She has worn a hole in her bedsheet rubbing her foot. Zanaflex wears off by 3 in the morning so we are going to try a longer acting medication. She has not tolerated Mirapex well. Plan-try clonazepam with appropriate discussion

## 2015-12-08 NOTE — Progress Notes (Signed)
08/10/2015-55 year old female former smoker Referred by Emily Melton for nocturnal hypoxemia. Sleep Study 2016 - Emily Melton. History of Narcolepsy - diagnosed 2001- tried and failed Ritalin and Provigil. Epworth Score: 13 NPSG  11/30/14- [facility] AHI 5.3/ hr, desat to 83%, weight 216 lbs An earlier study in 2001 in [place], [place], with report no longer available, included MSLT and she says diagnosed narcolepsy without sleep apnea. The 2016 sleep study was supposed to have included and MST but that didn't get done and she didn't like her experience there.. She takes Zanaflex for cervical radiculopathy pain and it helps her sleep. She avoids naps. Only sleeps on her sides. Denies sleep paralysis. Describes 2 episodes of possible cataplexy when knees gave out, but circumstances not clear. Previous trials of Provigil and Ritalin both said to cause HBP and "swelling". She ended up on Dexedrine for a while. Tried Mirapex for vaguely defined limb jerks. This caused a lot of malaise and nausea. History of recurrent bronchitis and chronic cough until she quit smoking. Several previous pneumonias.  12/08/2015-55 year old female former smoker followed for nocturnal hypoxia/minimal OSA, history Narcolepsy NPSG 10/19/2015-AHI 2.4 per hour/WNL, desaturation to 81%, moderate snoring, severe PLMS with arousal 6.3/hour MSLT- 10/20/2015-mean latency 3 minutes 43 seconds, 0/5 SOREM    C/W idiopathic hypersomnia FOLLOW FOR: PFT today.  DOE; wants to review notes from Hospital visit in March.  Pt states she has an aunt and uncle that both passed from Pulmonary Fibrosis.  Discuss Sleep Study results.  Zanaflex helps her sleep. Without it leg movement disorder disturbing-disliked Mirapex. No routine naps so she can sleep better at night. ED visit 10/13/15- musculoskeletal chest wall pain triggered by stretching Went to ER in March after she stretched and felt a sharp pain in her sternum and into her neck, which  resolved. Negative workup then. Chest x-ray reviewed with her PFT 12/08/2015-moderate obstructive airways disease, insignificant response to bronchodilator, normal total lung capacity. They could not get an accurate diffusion capacity. CXR 10/13/2015 IMPRESSION: Enlargement of cardiac silhouette. Bronchitic changes with RIGHT basilar scarring and LEFT basilar subsegmental atelectasis. Electronically Signed  By: Emily Dana M.D.  On: 10/13/2015 21:54  ROS-see HPI   Negative unless "+" Constitutional:    weight loss, night sweats, fevers, chills, + fatigue, lassitude. HEENT:    headaches, difficulty swallowing, tooth/dental problems, sore throat,       sneezing, itching, ear ache, nasal congestion, post nasal drip, snoring CV:    chest pain, orthopnea, PND, swelling in lower extremities, anasarca,                                                     dizziness, palpitations Resp:   shortness of breath with exertion or at rest.                productive cough,   non-productive cough, coughing up of blood.              change in color of mucus.  wheezing.   Skin:    rash or lesions. GI:  No-   heartburn, indigestion, abdominal pain, nausea, vomiting, diarrhea,                 change in bowel habits, loss of appetite GU: dysuria, change in color of urine, no urgency or frequency.   flank pain.  MS:   joint pain, stiffness, decreased range of motion, + back pain. Neuro-     nothing unusual Psych:  change in mood or affect.  depression or anxiety.   memory loss.  OBJ- Physical Exam General- Alert, Oriented, Affect-appropriate, Distress- none acute, + overweight with thick neck Skin- rash-none, lesions- none, excoriation- none Lymphadenopathy- none Head- atraumatic            Eyes- Gross vision intact, PERRLA, conjunctivae and secretions clear            Ears- Hearing, canals-normal            Nose- Clear, no-Septal dev, mucus, polyps, erosion, perforation             Throat- Mallampati  III , mucosa clear , drainage- none, tonsils- atrophic Neck- flexible , trachea midline, no stridor , thyroid nl, carotid no bruit Chest - symmetrical excursion , unlabored           Heart/CV- RRR , no murmur , no gallop  , no rub, nl s1 s2                           - JVD- none , edema- none, stasis changes- none, varices- none           Lung- clear to P&A, wheeze- none, cough- none , dullness-none, rub- none           Chest wall-  Abd-  Br/ Gen/ Rectal- Not done, not indicated Extrem- cyanosis- none, clubbing, none, atrophy- none, strength- nl Neuro- grossly intact to observation

## 2015-12-08 NOTE — Patient Instructions (Addendum)
Try printed Rx for clonazepam taking 1 or 2 tabs up to 30 minutes before bedtime for sleep. Try it instead of Zanaflex for comparison. See if this helps you sleep more soundly with less leg movement.   Walking and weight loss would be good for shortness of breath.

## 2015-12-08 NOTE — Assessment & Plan Note (Signed)
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 Did not have sleep onset REM, favoring latter diagnosis. She describes onset of dreaming almost as soon as she falls asleep suggesting increased REM pressure favoring narcolepsy. Treatment is the same. Educated on sleep hygiene and the importance of naps, her responsibility to drive safely and care with use of sedating medications. We're going to first try consolidating nighttime sleep a little better.

## 2015-12-08 NOTE — Progress Notes (Signed)
PFT performed today. 

## 2015-12-08 NOTE — Assessment & Plan Note (Addendum)
Mild to moderate obstructive airways disease without response to bronchodilator. Without significant wheeze or cough. Former smoker. Not wheezing or coughing much. Suggested effort at weight loss and increased exertion for stamina first while we sort out some of her other meds.

## 2015-12-20 ENCOUNTER — Other Ambulatory Visit: Payer: Self-pay | Admitting: Family Medicine

## 2016-01-05 ENCOUNTER — Telehealth: Payer: Self-pay

## 2016-01-05 NOTE — Telephone Encounter (Signed)
Dr. Tamala Julian  I spoke with this pt. About her lab results. She wanted to inform you that she was not fasting before you took her blood that is why her glucose was a little elevated. She also wanted to ask you do you think her liver test was elevated because she has been taking zanaflex for years, and she recently stopped taking it, do you think that medication is the cause for her liver test to be elevated and she also  oticed that her urinary problems have improved since she stopped taking the muscle relaxer.

## 2016-01-09 ENCOUNTER — Other Ambulatory Visit: Payer: Self-pay | Admitting: Family Medicine

## 2016-01-19 ENCOUNTER — Other Ambulatory Visit: Payer: Self-pay | Admitting: *Deleted

## 2016-01-19 DIAGNOSIS — I10 Essential (primary) hypertension: Secondary | ICD-10-CM

## 2016-01-19 MED ORDER — PROPRANOLOL HCL 20 MG PO TABS: 60.0000 mg | ORAL_TABLET | Freq: Two times a day (BID) | ORAL | Status: DC

## 2016-02-17 ENCOUNTER — Other Ambulatory Visit: Payer: Self-pay | Admitting: Family Medicine

## 2016-03-21 ENCOUNTER — Other Ambulatory Visit: Payer: Self-pay | Admitting: Family Medicine

## 2016-03-27 ENCOUNTER — Other Ambulatory Visit: Payer: Self-pay | Admitting: Family Medicine

## 2016-04-13 ENCOUNTER — Other Ambulatory Visit: Payer: Self-pay | Admitting: Gastroenterology

## 2016-04-13 DIAGNOSIS — R7989 Other specified abnormal findings of blood chemistry: Secondary | ICD-10-CM

## 2016-04-13 DIAGNOSIS — R945 Abnormal results of liver function studies: Principal | ICD-10-CM

## 2016-04-18 ENCOUNTER — Ambulatory Visit
Admission: RE | Admit: 2016-04-18 | Discharge: 2016-04-18 | Disposition: A | Discharge status: Home / Self Care | Payer: Commercial Managed Care - HMO | Source: Ambulatory Visit | Attending: Gastroenterology | Admitting: Gastroenterology

## 2016-04-18 DIAGNOSIS — R7989 Other specified abnormal findings of blood chemistry: Secondary | ICD-10-CM

## 2016-04-18 DIAGNOSIS — R945 Abnormal results of liver function studies: Principal | ICD-10-CM

## 2016-04-19 ENCOUNTER — Ambulatory Visit
Admission: RE | Admit: 2016-04-19 | Discharge: 2016-04-19 | Disposition: A | Discharge status: Home / Self Care | Payer: Commercial Managed Care - HMO | Source: Ambulatory Visit | Attending: Gastroenterology | Admitting: Gastroenterology

## 2016-04-19 ENCOUNTER — Other Ambulatory Visit: Payer: Self-pay | Admitting: Internal Medicine

## 2016-04-19 ENCOUNTER — Other Ambulatory Visit: Payer: Self-pay | Admitting: Family Medicine

## 2016-04-19 DIAGNOSIS — K760 Fatty (change of) liver, not elsewhere classified: Secondary | ICD-10-CM | POA: Diagnosis not present

## 2016-04-19 DIAGNOSIS — Z9049 Acquired absence of other specified parts of digestive tract: Secondary | ICD-10-CM | POA: Diagnosis not present

## 2016-04-19 DIAGNOSIS — R7989 Other specified abnormal findings of blood chemistry: Secondary | ICD-10-CM | POA: Diagnosis not present

## 2016-04-20 ENCOUNTER — Ambulatory Visit (INDEPENDENT_AMBULATORY_CARE_PROVIDER_SITE_OTHER): Payer: Commercial Managed Care - HMO | Admitting: Internal Medicine

## 2016-04-20 ENCOUNTER — Encounter: Payer: Self-pay | Admitting: Internal Medicine

## 2016-04-20 DIAGNOSIS — Z23 Encounter for immunization: Secondary | ICD-10-CM | POA: Diagnosis not present

## 2016-04-20 DIAGNOSIS — F5101 Primary insomnia: Secondary | ICD-10-CM

## 2016-04-20 DIAGNOSIS — J449 Chronic obstructive pulmonary disease, unspecified: Secondary | ICD-10-CM | POA: Diagnosis not present

## 2016-04-20 MED ORDER — MONTELUKAST SODIUM 10 MG PO TABS: 10.0000 mg | ORAL_TABLET | Freq: Every day | ORAL | 12 refills | Status: DC

## 2016-04-20 MED ORDER — CLONAZEPAM 0.5 MG PO TABS: ORAL_TABLET | ORAL | 5 refills | Status: DC

## 2016-04-20 NOTE — Assessment & Plan Note (Signed)
Chest cold/ acute rhinitis and bronchitis this summer. Rx'd at urgent care. Now back to baseline. Not needing inhalers. Plan- flu vax

## 2016-04-20 NOTE — Assessment & Plan Note (Signed)
Better control of insomnia and good sleep habits, benefiting from clonazepam, substantially improve daytime sleepiness. Coordinate sleep schedule with husband's. Best dose of clonazepam 0.75 mg prevents daytime hang-over and improves sleep qulaity.

## 2016-04-20 NOTE — Patient Instructions (Signed)
Flu vax  Script refilling Singulair  Script refilling clonazepam  Ok to stick to a fairly regular sleep-wake schedule that works for you

## 2016-04-20 NOTE — Progress Notes (Signed)
08/10/2015-55 year old female former smoker Referred by Dr Tamala Julian for nocturnal hypoxemia. Sleep Study 2016 - Dr Rexene Alberts. History of Narcolepsy - diagnosed 2001- tried and failed Ritalin and Provigil. Epworth Score: 13 NPSG  11/30/14- [facility] AHI 5.3/ hr, desat to 83%, weight 216 lbs An earlier study in 2001 in [place], [place], with report no longer available, included MSLT and she says diagnosed narcolepsy without sleep apnea. The 2016 sleep study was supposed to have included and MST but that didn't get done and she didn't like her experience there.. She takes Zanaflex for cervical radiculopathy pain and it helps her sleep. She avoids naps. Only sleeps on her sides. Denies sleep paralysis. Describes 2 episodes of possible cataplexy when knees gave out, but circumstances not clear. Previous trials of Provigil and Ritalin both said to cause HBP and "swelling". She ended up on Dexedrine for a while. Tried Mirapex for vaguely defined limb jerks. This caused a lot of malaise and nausea. History of recurrent bronchitis and chronic cough until she quit smoking. Several previous pneumonias.  12/08/2015-55 year old female former smoker followed for nocturnal hypoxia/minimal OSA, history Narcolepsy NPSG 10/19/2015-AHI 2.4 per hour/WNL, desaturation to 81%, moderate snoring, severe PLMS with arousal 6.3/hour MSLT- 10/20/2015-mean latency 3 minutes 43 seconds, 0/5 SOREM    C/W idiopathic hypersomnia FOLLOW FOR: PFT today.  DOE; wants to review notes from Hospital visit in March.  Pt states she has an aunt and uncle that both passed from Pulmonary Fibrosis.  Discuss Sleep Study results.  Zanaflex helps her sleep. Without it leg movement disorder disturbing-disliked Mirapex. No routine naps so she can sleep better at night. ED visit 10/13/15- musculoskeletal chest wall pain triggered by stretching Went to ER in March after she stretched and felt a sharp pain in her sternum and into her neck, which  resolved. Negative workup then. Chest x-ray reviewed with her PFT 12/08/2015-moderate obstructive airways disease, insignificant response to bronchodilator, normal total lung capacity. They could not get an accurate diffusion capacity. CXR 10/13/2015 IMPRESSION: Enlargement of cardiac silhouette. Bronchitic changes with RIGHT basilar scarring and LEFT basilar subsegmental atelectasis. Electronically Signed  By: Lavonia Dana M.D.  On: 10/13/2015 21:54  04/20/2016-55 year old female former smoker followed COPD for nocturnal hypoxia/minimal OSA, history narcolepsy FOLLOWS FOR: Pt states she needs refill for Clonazepam, questions use of ETOH and opioids with sleep meds. Pt is sleeping through the night but wakes tired and stays tired through the day.  Best dose prob clonazepam 0.75 mg. Discussed med interactions. Preferred hs 11-12 PM, up 8AM. Had chest cold from grand kids Rx'd ceftin. Cough kept her awake but didn't want to overlap codeine cough syrup w clonazepam.  ROS-see HPI   Negative unless "+" Constitutional:    weight loss, night sweats, fevers, chills, + fatigue, lassitude. HEENT:    headaches, difficulty swallowing, tooth/dental problems, sore throat,       sneezing, itching, ear ache, nasal congestion, post nasal drip, snoring CV:    chest pain, orthopnea, PND, swelling in lower extremities, anasarca,                                                     dizziness, palpitations Resp:   shortness of breath with exertion or at rest.                productive cough,   non-productive  cough, coughing up of blood.              change in color of mucus.  wheezing.   Skin:    rash or lesions. GI:  No-   heartburn, indigestion, abdominal pain, nausea, vomiting, diarrhea,                 change in bowel habits, loss of appetite GU: dysuria, change in color of urine, no urgency or frequency.   flank pain. MS:   joint pain, stiffness, decreased range of motion, + back pain. Neuro-      nothing unusual Psych:  change in mood or affect.  depression or anxiety.   memory loss.  OBJ- Physical Exam General- Alert, Oriented, Affect-appropriate, Distress- none acute, + overweight with thick neck Skin- rash-none, lesions- none, excoriation- none Lymphadenopathy- none Head- atraumatic            Eyes- Gross vision intact, PERRLA, conjunctivae and secretions clear            Ears- Hearing, canals-normal            Nose- Clear, no-Septal dev, mucus, polyps, erosion, perforation             Throat- Mallampati III , mucosa clear , drainage- none, tonsils- atrophic Neck- flexible , trachea midline, no stridor , thyroid nl, carotid no bruit Chest - symmetrical excursion , unlabored           Heart/CV- RRR , no murmur , no gallop  , no rub, nl s1 s2                           - JVD- none , edema- none, stasis changes- none, varices- none           Lung- clear to P&A, wheeze- none, cough- none , dullness-none, rub- none           Chest wall-  Abd-  Br/ Gen/ Rectal- Not done, not indicated Extrem- cyanosis- none, clubbing, none, atrophy- none, strength- nl Neuro- grossly intact to observation

## 2016-04-24 ENCOUNTER — Ambulatory Visit (INDEPENDENT_AMBULATORY_CARE_PROVIDER_SITE_OTHER): Payer: Commercial Managed Care - HMO | Admitting: Family Medicine

## 2016-04-24 ENCOUNTER — Ambulatory Visit (INDEPENDENT_AMBULATORY_CARE_PROVIDER_SITE_OTHER): Payer: Medicare Other

## 2016-04-24 VITALS — BP 162/96 | HR 55 | Temp 98.3°F | Resp 18 | Ht 61.5 in | Wt 214.2 lb

## 2016-04-24 DIAGNOSIS — M25561 Pain in right knee: Secondary | ICD-10-CM | POA: Diagnosis not present

## 2016-04-24 DIAGNOSIS — M25461 Effusion, right knee: Secondary | ICD-10-CM

## 2016-04-24 NOTE — Patient Instructions (Addendum)
     IF you received an x-ray today, you will receive an invoice from [facility]. Please contact [facility] at [phone] with questions or concerns regarding your invoice.   IF you received labwork today, you will receive an invoice from [employer]. Please contact [facility] at [phone] with questions or concerns regarding your invoice.   Our billing staff will not be able to assist you with questions regarding bills from these companies.  You will be contacted with the lab results as soon as they are available. The fastest way to get your results is to activate your My Chart account. Instructions are located on the last page of this paperwork. If you have not heard from [place] regarding the results in 2 weeks, please contact this office.     It was nice meeting you today Ms. Emily Melton!  We have placed a referral for you to the orthopedic specialist. They will contact you with the date and time of that appointment.   In the meantime, you can wear the brace we have provided if this improves your pain. If you find that the brace is worsening your pain, you do not have to wear it.   You can continue to apply ice to the area, and take ibuprofen/Motrin for pain relief as needed.   If you have any questions or concerns, please feel free to call the clinic.   Be well,  Dr. Avon Gully

## 2016-04-24 NOTE — Assessment & Plan Note (Signed)
Worsening since fall onto anterior knee 1.5 mo ago. Possibly 2/2 meniscal tear given history of trauma, patient's age, body habitus, and degenerative changes. Guarded exam, however patient with effusion and generalized tenderness to palpation. AP and sunrise plain films with no fractures and only mild degenerative changes. Patient does not with for steroid injection, so referral made to ortho. Also provided with hinged brace and instructed to take NSAIDs PRN.

## 2016-04-24 NOTE — Progress Notes (Signed)
   Subjective:    Patient ID: Emily Melton, female    DOB: [birth year], 55 y.o.   MRN: [id number]  HPI  Patient presents with R knee pain.  R knee pain Began ~1.5 mo ago after falling onto anterior knee. Three days after the fall, she experienced a pull, following by a tearing and burning sensation in the R knee when walking up stairs. She was able to bear weight on the affected knee afterwards, though with significant pain. At that time, patient tried to stay off her knee, and also elevated and applied ice and heating pads to the knee. Her symptoms persisted, then began to worsen in the past few days.   She now reports pain most prominent above her knee, though says she has a constant aching throughout her leg. Is also endorsing pain down her leg to her R foot, with some tingling in this foot as well. Is having difficulty squatting and bending the knee. Says the pain is worst when bending her knee when walking downstairs (not when placing pressure on the knee). Reports sharp pains in the medial knee with bending movements.   Endorses swelling. Denies recent redness and warmth, though was present initially after incident. Denies bruising.   Review of Systems See HPI.     Objective:   Physical Exam  Constitutional: She is oriented to person, place, and time. She appears well-developed and well-nourished. No distress.  HENT:  Head: Normocephalic and atraumatic.  Pulmonary/Chest: Effort normal. No respiratory distress.  Musculoskeletal:  Guarded exam. Slightly swelling of the R knee. No erythema, ecchymosis, or warmth noted. Joint line tenderness bilaterally. Effusion present. Tender bony discrepancy present on anterior patella. Generalized TTP of knee, as well as upper leg and calf.   Neurological: She is alert and oriented to person, place, and time.  Psychiatric: She has a normal mood and affect. Her behavior is normal.      Assessment & Plan:  Acute pain of right knee Worsening  since fall onto anterior knee 1.5 mo ago. Possibly 2/2 meniscal tear given history of trauma, patient's age, body habitus, and degenerative changes. Guarded exam, however patient with effusion and generalized tenderness to palpation. AP and sunrise plain films with no fractures and only mild degenerative changes. Patient does not with for steroid injection, so referral made to ortho. Also provided with hinged brace and instructed to take NSAIDs PRN.   Adin Hector, MD, MPH

## 2016-04-25 NOTE — Telephone Encounter (Signed)
Last exam/lab 11/2015, upcoming appt. 05/2016

## 2016-05-16 ENCOUNTER — Telehealth: Payer: Self-pay

## 2016-05-16 NOTE — Telephone Encounter (Signed)
PATIENT WOULD LIKE THIS MESSAGE TO GO TO DR. Tamala Julian: SHE HAD AN APPOINTMENT ON NOV. 14, 2017 TO HAVE HER 6 MONTH LAB WORK DONE. BUT NOW SHE IS SCHEDULED TO HAVE SURGERY (ARTHOROSCOPY) ON HER (R) KNEE ON November 9th, SO SHE RESCHEDULED WITH DR. Tamala Julian ON November 8th. BUT NOW SHE WANTS TO KNOW JUST WHEN DR. [place] HER TO RETURN? SHE IS NOT SURE IF IT'S BEST TO HAVE HER LAB WORK DONE BEFORE HER SURGERY OR JUST WAIT? BEST PHONE [phone] (CELL)  [place]

## 2016-05-19 NOTE — Telephone Encounter (Signed)
Called and LM wDr Smith's advice below.

## 2016-05-19 NOTE — Telephone Encounter (Signed)
Dr Smith, please advise.

## 2016-05-19 NOTE — Telephone Encounter (Signed)
Call --- I think it is fine to have labs drawn on 05/24/16 when she comes in for her visit with me.

## 2016-05-22 ENCOUNTER — Other Ambulatory Visit: Payer: Self-pay | Admitting: Family Medicine

## 2016-05-24 ENCOUNTER — Other Ambulatory Visit: Payer: Self-pay

## 2016-05-24 ENCOUNTER — Encounter: Payer: Self-pay | Admitting: Family Medicine

## 2016-05-24 ENCOUNTER — Ambulatory Visit (INDEPENDENT_AMBULATORY_CARE_PROVIDER_SITE_OTHER): Payer: Commercial Managed Care - HMO | Admitting: Family Medicine

## 2016-05-24 VITALS — BP 122/82 | HR 60 | Temp 97.8°F | Resp 16 | Ht 62.0 in | Wt 212.0 lb

## 2016-05-24 DIAGNOSIS — I10 Essential (primary) hypertension: Secondary | ICD-10-CM

## 2016-05-24 DIAGNOSIS — Z6838 Body mass index (BMI) 38.0-38.9, adult: Secondary | ICD-10-CM

## 2016-05-24 DIAGNOSIS — R7302 Impaired glucose tolerance (oral): Secondary | ICD-10-CM | POA: Diagnosis not present

## 2016-05-24 DIAGNOSIS — G4761 Periodic limb movement disorder: Secondary | ICD-10-CM

## 2016-05-24 DIAGNOSIS — M5030 Other cervical disc degeneration, unspecified cervical region: Secondary | ICD-10-CM | POA: Diagnosis not present

## 2016-05-24 DIAGNOSIS — E6609 Other obesity due to excess calories: Secondary | ICD-10-CM

## 2016-05-24 DIAGNOSIS — E7800 Pure hypercholesterolemia, unspecified: Secondary | ICD-10-CM

## 2016-05-24 DIAGNOSIS — Z0000 Encounter for general adult medical examination without abnormal findings: Secondary | ICD-10-CM

## 2016-05-24 DIAGNOSIS — J449 Chronic obstructive pulmonary disease, unspecified: Secondary | ICD-10-CM

## 2016-05-24 DIAGNOSIS — IMO0001 Reserved for inherently not codable concepts without codable children: Secondary | ICD-10-CM

## 2016-05-24 LAB — POCT URINALYSIS DIP (MANUAL ENTRY)
Bilirubin, UA: NEGATIVE
Blood, UA: NEGATIVE
Glucose, UA: NEGATIVE
Ketones, POC UA: NEGATIVE
Leukocytes, UA: NEGATIVE
Nitrite, UA: NEGATIVE
Spec Grav, UA: 1.02
Urobilinogen, UA: 0.2
pH, UA: 5

## 2016-05-24 MED ORDER — AMLODIPINE BESYLATE 10 MG PO TABS: 10.0000 mg | ORAL_TABLET | Freq: Every day | ORAL | 3 refills | Status: DC

## 2016-05-24 MED ORDER — LOSARTAN POTASSIUM 100 MG PO TABS: 100.0000 mg | ORAL_TABLET | Freq: Every day | ORAL | 3 refills | Status: DC

## 2016-05-24 MED ORDER — HYDROCHLOROTHIAZIDE 12.5 MG PO TABS: 12.5000 mg | ORAL_TABLET | Freq: Every day | ORAL | 3 refills | Status: DC

## 2016-05-24 MED ORDER — PROPRANOLOL HCL 20 MG PO TABS: 60.0000 mg | ORAL_TABLET | Freq: Two times a day (BID) | ORAL | 3 refills | Status: DC

## 2016-05-24 MED ORDER — PROPRANOLOL HCL 20 MG PO TABS
60.0000 mg | ORAL_TABLET | Freq: Two times a day (BID) | ORAL | 3 refills | Status: DC
Start: 2016-05-24 — End: 2016-05-24

## 2016-05-24 MED ORDER — MONTELUKAST SODIUM 10 MG PO TABS: 10.0000 mg | ORAL_TABLET | Freq: Every day | ORAL | 3 refills | Status: DC

## 2016-05-24 NOTE — Progress Notes (Signed)
Subjective:    Patient ID: Emily Melton, female    DOB: 04/28/1961, 55 y.o.   MRN: [id number]  05/24/2016  Annual Exam   HPI This 55 y.o. female presents for Complete Physical Examination.  Last physical:  04-05-2015 Pap smear:  07-15-2009; hysterectomy; ovaries intact. Mammogram:  08-11-2015 Colonoscopy:  12-22-2011   Immunization History  Administered Date(s) Administered  . Influenza Split 06/21/2011  . Influenza,inj,Quad PF,36+ Mos 04/30/2013, 05/22/2014, 04/05/2015, 04/20/2016  . Pneumococcal Polysaccharide-23 07/18/2011  . Tdap 08/01/2010   BP Readings from Last 3 Encounters:  05/24/16 122/82  04/24/16 (!) 162/96  04/20/16 130/80   Wt Readings from Last 3 Encounters:  05/24/16 212 lb (96.2 kg)  04/24/16 214 lb 3.2 oz (97.2 kg)  04/20/16 215 lb 6.4 oz (97.7 kg)   Having knee surgery tomorrow.  Needs blood work.   Review of Systems  Constitutional: Positive for fatigue. Negative for activity change, appetite change, chills, diaphoresis, fever and unexpected weight change.  HENT: Negative for congestion, dental problem, drooling, ear discharge, ear pain, facial swelling, hearing loss, mouth sores, nosebleeds, postnasal drip, rhinorrhea, sinus pressure, sneezing, sore throat, tinnitus, trouble swallowing and voice change.   Eyes: Negative for photophobia, pain, discharge, redness, itching and visual disturbance.  Respiratory: Negative for apnea, cough, choking, chest tightness, shortness of breath, wheezing and stridor.   Cardiovascular: Negative for chest pain, palpitations and leg swelling.  Gastrointestinal: Negative for abdominal distention, abdominal pain, anal bleeding, blood in stool, constipation, diarrhea, nausea, rectal pain and vomiting.  Endocrine: Negative for cold intolerance, heat intolerance, polydipsia, polyphagia and polyuria.  Genitourinary: Negative for decreased urine volume, difficulty urinating, dyspareunia, dysuria, enuresis, flank pain,  frequency, genital sores, hematuria, menstrual problem, pelvic pain, urgency, vaginal bleeding, vaginal discharge and vaginal pain.  Musculoskeletal: Positive for arthralgias, back pain, myalgias, neck pain and neck stiffness. Negative for gait problem and joint swelling.  Skin: Negative for color change, pallor, rash and wound.  Allergic/Immunologic: Negative for environmental allergies, food allergies and immunocompromised state.  Neurological: Negative for dizziness, tremors, seizures, syncope, facial asymmetry, speech difficulty, weakness, light-headedness, numbness and headaches.  Hematological: Negative for adenopathy. Does not bruise/bleed easily.  Psychiatric/Behavioral: Positive for dysphoric mood. Negative for agitation, behavioral problems, confusion, decreased concentration, hallucinations, self-injury, sleep disturbance and suicidal ideas. The patient is nervous/anxious. The patient is not hyperactive.     Past Medical History:  Diagnosis Date  . Allergic rhinitis, cause unspecified   . Allergy   . Anemia   . Arthritis    C-spine; 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 Barrett's esophagus   . Chicken pox    childhood  . Depression   . Diaphragmatic hernia without mention of obstruction or gangrene   . Fibromyalgia   . GERD (gastroesophageal reflux disease)   . HTN (hypertension)   . Insomnia, unspecified   . Migraine, unspecified, without mention of intractable migraine without mention of status migrainosus   . Narcolepsy    w/o cataplexy  . Nonspecific abnormal electrocardiogram (ECG) (EKG)   . Osteoporosis   . Other abnormal glucose   . Periodic limb movement disorder   . Personal history of colonic polyps   . Tobacco use disorder   . Unspecified vitamin D deficiency    Past Surgical History:  Procedure Laterality Date  . ABDOMINAL HYSTERECTOMY     fibroids and endometriosis.  ovarian resection R.  . BACK SURGERY    . BREAST  EXCISIONAL BIOPSY  2005    R   Benign  . carpal tunnel release - bilateral    . CESAREAN SECTION     x2  1984 and 1986  . CHOLECYSTECTOMY    . 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 COLONOSCOPY  03/17/2012   [facility]  . DILATION AND CURETTAGE OF UTERUS     SAB  . ESOPHAGOGASTRODUODENOSCOPY  03/17/2012   no Barrett's esophagus. [facility].  . fusion of cervical spine  2007   C5,C6,C7, and T1  . laprascopy  2009   for RLQ pain:R oophorectomy performed no improvement in pain.   . pilonidal cyst resection  1992  . R shoulder surgery  06/24/09   2 tears in rotator cuff,bone spurs. Dr. Modena Morrow  . SPINE SURGERY  09/2011   Cervical spine fusion  . TUBAL LIGATION    . tubel ligation  2002  . VAGINAL HYSTERECTOMY  2009   fibroid/endometriosis, one L ovary remaining   Allergies  Allergen Reactions  . Duloxetine Hcl Other (See Comments)    Hypertension, mood changes  . Mirtazapine Swelling  . Serotonin Reuptake Inhibitors (Ssris) Other (See Comments)    Hypertension, mood changes  . St Johns Wort Anaphylaxis  . Bupropion Hcl Other (See Comments)    Mental changes and high blood pressure   . Celebrex [Celecoxib] Hypertension  . Effexor [Venlafaxine] Other (See Comments)    Moodiness, vivid dreams, poor sleep cycle.  Recardo Evangelist [Pregabalin] Other (See Comments) and Hypertension    Mood changes, eye twitching and UTI  . Methylphenidate Hcl Other (See Comments)    unknown  . Mirapex [Pramipexole]     HTN, hallucinations and nausea  . Modafinil Swelling and Hypertension  . Rabeprazole Sodium Swelling and Other (See Comments)    Headaches   . Lisinopril Rash    Hypertension   Current Outpatient Prescriptions  Medication Sig Dispense Refill  . cetirizine (ZYRTEC) 10 MG tablet Take 10 mg by mouth daily.      . Cholecalciferol (VITAMIN D3) 2000 UNITS TABS Take 1 tablet by mouth daily.     . clonazePAM (KLONOPIN) 0.5 MG tablet 1 or 2 tabs at bedtime for sleep 60 tablet 5  .  Dexlansoprazole (DEXILANT) 30 MG capsule Take 30 mg by mouth daily.    Marland Kitchen HYDROcodone-acetaminophen (NORCO/VICODIN) 5-325 MG per tablet Take 1 tablet by mouth every 6 (six) hours as needed for moderate pain. 30 tablet 0  . HYDROmorphone (DILAUDID) 2 MG tablet Take 2 mg by mouth every 4 (four) hours as needed for severe pain.     . hyoscyamine (ANASPAZ) 0.125 MG TBDP Place 0.125-0.25 mg under the tongue every 6 (six) hours as needed for cramping. For stomach cramps    . montelukast (SINGULAIR) 10 MG tablet Take 1 tablet (10 mg total) by mouth at bedtime. 90 tablet 3  . Multiple Vitamin (MULTIVITAMIN) tablet Take 1 tablet by mouth daily.    . Omega-3 Fatty Acids (FISH OIL PO) Take 1,200 mg by mouth daily.    . psyllium (HYDROCIL/METAMUCIL) 95 % PACK Take 1 packet by mouth daily as needed for mild constipation.     Marland Kitchen amLODipine (NORVASC) 10 MG tablet Take 1 tablet (10 mg total) by mouth daily. 90 tablet 3  . hydrochlorothiazide (HYDRODIURIL) 12.5 MG tablet Take 1 tablet (12.5 mg total) by mouth daily. 90 tablet 3  . losartan (COZAAR) 100 MG tablet Take 1 tablet (100 mg total) by mouth daily. 90 tablet 3  .  propranolol (INDERAL) 20 MG tablet Take 3 tablets (60 mg total) by mouth 2 (two) times daily. 360 tablet 3   No current facility-administered medications for this visit.    Social History   Social History  . Marital status: Married    Spouse name: N/A  . Number of children: 2  . Years of education: High Schol   Occupational History  . Disabled [employer]   Social History Main Topics  . Smoking status: Former Smoker    Packs/day: 1.00    Years: 36.00    Types: Cigarettes    Quit date: 11/28/2013  . Smokeless tobacco: Former Systems developer    Quit date: 11/19/2013     Comment: smoked 1 ppd 30-35 years; quit in 2011  . Alcohol use No  . Drug use:     Types: Flunitrazepam  . Sexual activity: Yes    Birth control/ protection: Surgical   Other Topics Concern  . Not on file   Social  History Narrative   Patient exercises 3-4 times per week (walking x 20 minutes 3 days a week).    Marital status:  Married x 11 years; happily married; second marriage; no abuse.      Children:  2 children(31, 29); three grandsons.      Employment:  Long term disability through work; formal disability 12/2015.  Quit working 02/2013.      Tobacco:  1/2 ppd x 34 years.; quit smoking 11/2013.       Alcohol: rare;        Drugs: none      Exercise:  Rare       lives in [place].       Seatbelt:  100%       Guns: none   1-2 cups of coffee a day/ rare occasional Pepsi       Family History  Problem Relation Age of Onset  . Bipolar disorder Mother   . Cancer Mother     bladder  . Hypertension Mother   . Colon polyps Mother   . Osteoarthritis Mother   . Diabetes Mother   . Thyroid disease Mother   . Heart disease Father 22    CAD/AMI with stenting/PAD.  Marland Kitchen Hyperlipidemia Father   . Cancer Father     skin  . Angina Father   . Asthma Daughter   . Cancer Maternal Grandmother     colon  . Heart disease Maternal Grandmother   . Cancer Maternal Grandfather     lung  . Parkinson's disease Maternal Grandfather   . Stroke Maternal Grandfather   . Diabetes Maternal Grandfather   . Hyperlipidemia Maternal Grandfather   . Rheum arthritis Paternal Grandmother   . Heart attack Paternal Grandfather   . Pulmonary fibrosis Paternal Aunt   . Pulmonary fibrosis Paternal Uncle        Objective:    BP 122/82 (BP Location: Left Arm, Patient Position: Sitting, Cuff Size: Large)   Pulse 60   Temp 97.8 F (36.6 C) (Oral)   Resp 16   Ht 5' 2"  (1.575 m)   Wt 212 lb (96.2 kg)   SpO2 95%   BMI 38.78 kg/m  Physical Exam  Constitutional: She is oriented to person, place, and time. She appears well-developed and well-nourished. No distress.  HENT:  Head: Normocephalic and atraumatic.  Right Ear: External ear normal.  Left Ear: External ear normal.  Nose: Nose normal.  Mouth/Throat:  Oropharynx is clear and moist.  Eyes:  Conjunctivae and EOM are normal. Pupils are equal, round, and reactive to light.  Neck: Normal range of motion and full passive range of motion without pain. Neck supple. No JVD present. Carotid bruit is not present. No thyromegaly present.  Cardiovascular: Normal rate, regular rhythm and normal heart sounds.  Exam reveals no gallop and no friction rub.   No murmur heard. Pulmonary/Chest: Effort normal and breath sounds normal. She has no wheezes. She has no rales. Right breast exhibits no inverted nipple, no mass, no nipple discharge, no skin change and no tenderness. Left breast exhibits no inverted nipple, no mass, no nipple discharge, no skin change and no tenderness. Breasts are symmetrical.  Abdominal: Soft. Bowel sounds are normal. She exhibits no distension and no mass. There is no tenderness. There is no rebound and no guarding.  Genitourinary: Vagina normal. There is no rash, tenderness or lesion on the right labia. There is no rash, tenderness or lesion on the left labia.  Musculoskeletal:       Right shoulder: Normal.       Left shoulder: Normal.       Cervical back: Normal.  Lymphadenopathy:    She has no cervical adenopathy.  Neurological: She is alert and oriented to person, place, and time. She has normal reflexes. No cranial nerve deficit. She exhibits normal muscle tone. Coordination normal.  Skin: Skin is warm and dry. No rash noted. She is not diaphoretic. No erythema. No pallor.  Psychiatric: She has a normal mood and affect. Her behavior is normal. Judgment and thought content normal.  Nursing note and vitals reviewed.  Results for orders placed or performed in visit on 05/24/16  CBC with Differential/Platelet  Result Value Ref Range   WBC 5.7 3.8 - 10.8 K/uL   RBC 4.34 3.80 - 5.10 MIL/uL   Hemoglobin 13.3 11.7 - 15.5 g/dL   HCT 39.0 35.0 - 45.0 %   MCV 89.9 80.0 - 100.0 fL   MCH 30.6 27.0 - 33.0 pg   MCHC 34.1 32.0 - 36.0 g/dL     RDW 13.4 11.0 - 15.0 %   Platelets 203 140 - 400 K/uL   MPV 11.9 7.5 - 12.5 fL   Neutro Abs 2,280 1,500 - 7,800 cells/uL   Lymphs Abs 3,021 850 - 3,900 cells/uL   Monocytes Absolute 114 (L) 200 - 950 cells/uL   Eosinophils Absolute 114 15 - 500 cells/uL   Basophils Absolute 171 0 - 200 cells/uL   Neutrophils Relative % 40 %   Lymphocytes Relative 53 %   Monocytes Relative 2 %   Eosinophils Relative 2 %   Basophils Relative 3 %   Smear Review SEE NOTE   Comprehensive metabolic panel  Result Value Ref Range   Sodium 140 135 - 146 mmol/L   Potassium 4.2 3.5 - 5.3 mmol/L   Chloride 103 98 - 110 mmol/L   CO2 29 20 - 31 mmol/L   Glucose, Bld 91 65 - 99 mg/dL   BUN 14 7 - 25 mg/dL   Creat 0.66 0.50 - 1.05 mg/dL   Total Bilirubin 0.5 0.2 - 1.2 mg/dL   Alkaline Phosphatase 72 33 - 130 U/L   AST 25 10 - 35 U/L   ALT 29 6 - 29 U/L   Total Protein 7.1 6.1 - 8.1 g/dL   Albumin 4.5 3.6 - 5.1 g/dL   Calcium 11.1 (H) 8.6 - 10.4 mg/dL  Lipid panel  Result Value Ref Range   Cholesterol 199 <200  mg/dL   Triglycerides 67 <150 mg/dL   HDL 58 >50 mg/dL   Total CHOL/HDL Ratio 3.4 <5.0 Ratio   VLDL 13 <30 mg/dL   LDL Cholesterol 128 (H) mg/dL  Hemoglobin A1c  Result Value Ref Range   Hgb A1c MFr Bld 5.1 <5.7 %   Mean Plasma Glucose 100 mg/dL  Microalbumin, urine  Result Value Ref Range   Microalb, Ur 9.4 Not estab mg/dL  POCT urinalysis dipstick  Result Value Ref Range   Color, UA yellow yellow   Clarity, UA clear clear   Glucose, UA negative negative   Bilirubin, UA negative negative   Ketones, POC UA negative negative   Spec Grav, UA 1.020    Blood, UA negative negative   pH, UA 5.0    Protein Ur, POC trace (A) negative   Urobilinogen, UA 0.2    Nitrite, UA Negative Negative   Leukocytes, UA Negative Negative   Depression screen [facility] 2/9 05/24/2016 04/24/2016 11/30/2015 08/02/2015 08/02/2015  Decreased Interest 0 0 0 0 0  Down, Depressed, Hopeless 0 0 0 0 0  PHQ - 2 Score 0 0 0  0 0  Altered sleeping - - - - -  Tired, decreased energy - - - - -  Change in appetite - - - - -  Feeling bad or failure about yourself  - - - - -  Trouble concentrating - - - - -  Moving slowly or fidgety/restless - - - - -  Suicidal thoughts - - - - -  PHQ-9 Score - - - - -   Fall Risk  05/24/2016 04/24/2016 11/30/2015 04/05/2015 10/05/2014  Falls in the past year? Yes No No No No  Number falls in past yr: 2 or more - - - -       Assessment & Plan:   1. Welcome to Medicare preventive visit   2. Essential hypertension, benign   3. Glucose intolerance (impaired glucose tolerance)   4. COPD mixed type ([place])   5. DDD (degenerative disc disease), cervical   6. Pure hypercholesterolemia   7. Periodic limb movement sleep disorder   8. Class 2 obesity due to excess calories with serious comorbidity and body mass index (BMI) of 38.0 to 38.9 in adult    -anticipatory guidance --- exercise, weight loss, three servings of dairy daily. -obtain age appropriate labs. -refills provided. -independent with ADLs; low to moderate fall risk due to joint and DDD.   -no evidence of hearing loss or depression.  Discussed advanced directives; desires FULL CODE. -clonazepam prescribed by Dr. Baird Lyons for insomnia, RLS.    Orders Placed This Encounter  Procedures  . CBC with Differential/Platelet  . Comprehensive metabolic panel    Order Specific Question:   Has the patient fasted?    Answer:   Yes  . Lipid panel    Order Specific Question:   Has the patient fasted?    Answer:   Yes  . Hemoglobin A1c  . Microalbumin, urine  . POCT urinalysis dipstick  . EKG 12-Lead   Meds ordered this encounter  Medications  . DISCONTD: amLODipine (NORVASC) 10 MG tablet    Sig: Take 1 tablet (10 mg total) by mouth daily.    Dispense:  90 tablet    Refill:  3  . DISCONTD: hydrochlorothiazide (HYDRODIURIL) 12.5 MG tablet    Sig: Take 1 tablet (12.5 mg total) by mouth daily.    Dispense:  90 tablet     Refill:  3  . DISCONTD: losartan (COZAAR) 100 MG tablet    Sig: Take 1 tablet (100 mg total) by mouth daily.    Dispense:  90 tablet    Refill:  3  . montelukast (SINGULAIR) 10 MG tablet    Sig: Take 1 tablet (10 mg total) by mouth at bedtime.    Dispense:  90 tablet    Refill:  3  . DISCONTD: propranolol (INDERAL) 20 MG tablet    Sig: Take 3 tablets (60 mg total) by mouth 2 (two) times daily.    Dispense:  360 tablet    Refill:  3    Office visit needed for refills    Return in about 6 months (around 11/21/2016) for recheck high blood pressure, glucose intolerance.   Joycelyn Liska Elayne Guerin, M.D. [facility] [address] [place], [place]  [address] [phone] phone [phone] fax

## 2016-05-24 NOTE — Telephone Encounter (Signed)
Patient had an OV with Dr. Tamala Julian today. As the patient was checking out, she stated that she was having surgery soon and would like to have her medications closer to her husbands job. Refills [facility] into [facility] [place]. I will sent the refills to the [facility] [address] in [place]. Pt. Is aware.

## 2016-05-24 NOTE — Telephone Encounter (Signed)
11/2015 last ov for htn and labs Pt. cx her 11/14 appt.

## 2016-05-24 NOTE — Patient Instructions (Addendum)
IF you received an x-ray today, you will receive an invoice from [facility]. Please contact [facility] at [phone] with questions or concerns regarding your invoice.   IF you received labwork today, you will receive an invoice from [employer]. Please contact [facility] at [phone] with questions or concerns regarding your invoice.   Our billing staff will not be able to assist you with questions regarding bills from these companies.  You will be contacted with the lab results as soon as they are available. The fastest way to get your results is to activate your My Chart account. Instructions are located on the last page of this paperwork. If you have not heard from [place] regarding the results in 2 weeks, please contact this office.     Keeping you healthy  Get these tests  Blood pressure- Have your blood pressure checked once a year by your healthcare provider.  Normal blood pressure is 120/80  Weight- Have your body mass index (BMI) calculated to screen for obesity.  BMI is a measure of body fat based on height and weight. You can also calculate your own BMI at ViewBanking.[email or web address].  Cholesterol- Have your cholesterol checked every year.  Diabetes- Have your blood sugar checked regularly if you have high blood pressure, high cholesterol, have a family history of diabetes or if you are overweight.  Screening for Colon Cancer- Colonoscopy starting at age 31.  Screening may begin sooner depending on your family history and other health conditions. Follow up colonoscopy as directed by your Gastroenterologist.  Screening for Prostate Cancer- Both blood work (PSA) and a rectal exam help screen for Prostate Cancer.  Screening begins at age 55 with African-American men and at age 43 with Caucasian men.  Screening may begin sooner depending on your family history.  Take these medicines  Aspirin- One aspirin daily can help prevent Heart  disease and Stroke.  Flu shot- Every fall.  Tetanus- Every 10 years.  Zostavax- Once after the age of 17 to prevent Shingles.  Pneumonia shot- Once after the age of 56; if you are younger than 85, ask your healthcare provider if you need a Pneumonia shot.  Take these steps  Don't smoke- If you do smoke, talk to your doctor about quitting.  For tips on how to quit, go to www.smokefree.gov or call 1-800-QUIT-NOW.  Be physically active- Exercise 5 days a week for at least 30 minutes.  If you are not already physically active start slow and gradually work up to 30 minutes of moderate physical activity.  Examples of moderate activity include walking briskly, mowing the yard, dancing, swimming, bicycling, etc.  Eat a healthy diet- Eat a variety of healthy food such as fruits, vegetables, low fat milk, low fat cheese, yogurt, lean meant, poultry, fish, beans, tofu, etc. For more information go to www.thenutritionsource.org  Drink alcohol in moderation- Limit alcohol intake to less than two drinks a day. Never drink and drive.  Dentist- Brush and floss twice daily; visit your dentist twice a year.  Depression- Your emotional health is as important as your physical health. If you're feeling down, or losing interest in things you would normally enjoy please talk to your healthcare provider.  Eye exam- Visit your eye doctor every year.  Safe sex- If you may be exposed to a sexually transmitted infection, use a condom.  Seat belts- Seat belts can save your life; always wear one.  Smoke/Carbon Monoxide detectors- These detectors need to be installed  on the appropriate level of your home.  Replace batteries at least once a year.  Skin cancer- When out in the sun, cover up and use sunscreen 15 SPF or higher.  Violence- If anyone is threatening you, please tell your healthcare provider.  Living Will/ Health care power of attorney- Speak with your healthcare provider and family.

## 2016-05-25 LAB — CBC WITH DIFFERENTIAL/PLATELET
Basophils Absolute: 171 cells/uL (ref 0–200)
Basophils Relative: 3 %
Eosinophils Absolute: 114 cells/uL (ref 15–500)
Eosinophils Relative: 2 %
HCT: 39 % (ref 35.0–45.0)
Hemoglobin: 13.3 g/dL (ref 11.7–15.5)
Lymphocytes Relative: 53 %
Lymphs Abs: 3021 cells/uL (ref 850–3900)
MCH: 30.6 pg (ref 27.0–33.0)
MCHC: 34.1 g/dL (ref 32.0–36.0)
MCV: 89.9 fL (ref 80.0–100.0)
MPV: 11.9 fL (ref 7.5–12.5)
Monocytes Absolute: 114 cells/uL — ABNORMAL LOW (ref 200–950)
Monocytes Relative: 2 %
Neutro Abs: 2280 cells/uL (ref 1500–7800)
Neutrophils Relative %: 40 %
Platelets: 203 10*3/uL (ref 140–400)
RBC: 4.34 MIL/uL (ref 3.80–5.10)
RDW: 13.4 % (ref 11.0–15.0)
WBC: 5.7 10*3/uL (ref 3.8–10.8)

## 2016-05-25 LAB — COMPREHENSIVE METABOLIC PANEL
ALT: 29 U/L (ref 6–29)
AST: 25 U/L (ref 10–35)
Albumin: 4.5 g/dL (ref 3.6–5.1)
Alkaline Phosphatase: 72 U/L (ref 33–130)
BUN: 14 mg/dL (ref 7–25)
CO2: 29 mmol/L (ref 20–31)
Calcium: 11.1 mg/dL — ABNORMAL HIGH (ref 8.6–10.4)
Chloride: 103 mmol/L (ref 98–110)
Creat: 0.66 mg/dL (ref 0.50–1.05)
Glucose, Bld: 91 mg/dL (ref 65–99)
Potassium: 4.2 mmol/L (ref 3.5–5.3)
Sodium: 140 mmol/L (ref 135–146)
Total Bilirubin: 0.5 mg/dL (ref 0.2–1.2)
Total Protein: 7.1 g/dL (ref 6.1–8.1)

## 2016-05-25 LAB — LIPID PANEL
Cholesterol: 199 mg/dL (ref <200)
HDL: 58 mg/dL (ref >50)
LDL Cholesterol: 128 mg/dL — ABNORMAL HIGH
Total CHOL/HDL Ratio: 3.4 Ratio (ref <5.0)
Triglycerides: 67 mg/dL (ref <150)
VLDL: 13 mg/dL (ref <30)

## 2016-05-25 LAB — MICROALBUMIN, URINE: Microalb, Ur: 9.4 mg/dL

## 2016-05-26 LAB — HEMOGLOBIN A1C
Hgb A1c MFr Bld: 5.1 % (ref <5.7)
Mean Plasma Glucose: 100 mg/dL

## 2016-05-30 ENCOUNTER — Ambulatory Visit: Payer: 59 | Admitting: Family Medicine

## 2016-06-16 ENCOUNTER — Telehealth: Payer: Self-pay

## 2016-06-16 NOTE — Telephone Encounter (Signed)
Pt called asking for providers feedback on last labs preformed. They are released on mychart but there are no notes attached. Thank you!

## 2016-06-17 NOTE — Telephone Encounter (Signed)
Will you please comment on lab results from 05/24/16, and advise patient via my chart ?

## 2016-06-19 NOTE — Telephone Encounter (Signed)
Recommendations on lab results sent to patient via [place].

## 2016-07-18 ENCOUNTER — Ambulatory Visit (INDEPENDENT_AMBULATORY_CARE_PROVIDER_SITE_OTHER): Payer: Commercial Managed Care - HMO

## 2016-07-18 ENCOUNTER — Ambulatory Visit (INDEPENDENT_AMBULATORY_CARE_PROVIDER_SITE_OTHER): Payer: Medicare Other | Admitting: Urgent Care

## 2016-07-18 VITALS — BP 136/80 | HR 61 | Temp 98.0°F | Resp 18 | Ht 62.0 in | Wt 215.0 lb

## 2016-07-18 DIAGNOSIS — J069 Acute upper respiratory infection, unspecified: Secondary | ICD-10-CM | POA: Diagnosis not present

## 2016-07-18 DIAGNOSIS — Z87891 Personal history of nicotine dependence: Secondary | ICD-10-CM

## 2016-07-18 DIAGNOSIS — R05 Cough: Secondary | ICD-10-CM

## 2016-07-18 DIAGNOSIS — R0789 Other chest pain: Secondary | ICD-10-CM | POA: Diagnosis not present

## 2016-07-18 DIAGNOSIS — B9789 Other viral agents as the cause of diseases classified elsewhere: Secondary | ICD-10-CM

## 2016-07-18 DIAGNOSIS — R059 Cough, unspecified: Secondary | ICD-10-CM

## 2016-07-18 LAB — POCT CBC
Granulocyte percent: 66.9 %G (ref 37–80)
HCT, POC: 37.6 % — AB (ref 37.7–47.9)
Hemoglobin: 13.2 g/dL (ref 12.2–16.2)
Lymph, poc: 2 (ref 0.6–3.4)
MCH, POC: 30.6 pg (ref 27–31.2)
MCHC: 35.1 g/dL (ref 31.8–35.4)
MCV: 87.1 fL (ref 80–97)
MID (cbc): 0.5 (ref 0–0.9)
MPV: 8.9 fL (ref 0–99.8)
POC Granulocyte: 5.1 (ref 2–6.9)
POC LYMPH PERCENT: 26.3 %L (ref 10–50)
POC MID %: 6.8 %M (ref 0–12)
Platelet Count, POC: 151 10*3/uL (ref 142–424)
RBC: 4.31 M/uL (ref 4.04–5.48)
RDW, POC: 13.2 %
WBC: 7.6 10*3/uL (ref 4.6–10.2)

## 2016-07-18 MED ORDER — BENZONATATE 100 MG PO CAPS: 100.0000 mg | ORAL_CAPSULE | Freq: Three times a day (TID) | ORAL | 0 refills | Status: DC | PRN

## 2016-07-18 MED ORDER — HYDROCODONE-HOMATROPINE 5-1.5 MG/5ML PO SYRP
5.0000 mL | ORAL_SOLUTION | Freq: Three times a day (TID) | ORAL | 0 refills | Status: DC | PRN
Start: 2016-07-18 — End: 2017-02-07

## 2016-07-18 MED ORDER — ALBUTEROL SULFATE HFA 108 (90 BASE) MCG/ACT IN AERS
2.0000 | INHALATION_SPRAY | Freq: Four times a day (QID) | RESPIRATORY_TRACT | 1 refills | Status: DC | PRN
Start: 2016-07-18 — End: 2017-02-07

## 2016-07-18 MED ORDER — PSEUDOEPHEDRINE HCL 30 MG PO TABS: 30.0000 mg | ORAL_TABLET | Freq: Three times a day (TID) | ORAL | 0 refills | Status: DC | PRN

## 2016-07-18 NOTE — Patient Instructions (Addendum)
Cough, Adult Coughing is a reflex that clears your throat and your airways. Coughing helps to heal and protect your lungs. It is normal to cough occasionally, but a cough that happens with other symptoms or lasts a long time may be a sign of a condition that needs treatment. A cough may last only 2-3 weeks (acute), or it may last longer than 8 weeks (chronic). What are the causes? Coughing is commonly caused by:  Breathing in substances that irritate your lungs.  A viral or bacterial respiratory infection.  Allergies.  Asthma.  Postnasal drip.  Smoking.  Acid backing up from the stomach into the esophagus (gastroesophageal reflux).  Certain medicines.  Chronic lung problems, including COPD (or rarely, lung cancer).  Other medical conditions such as heart failure. Follow these instructions at home: Pay attention to any changes in your symptoms. Take these actions to help with your discomfort:  Take medicines only as told by your health care provider.  If you were prescribed an antibiotic medicine, take it as told by your health care provider. Do not stop taking the antibiotic even if you start to feel better.  Talk with your health care provider before you take a cough suppressant medicine.  Drink enough fluid to keep your urine clear or pale yellow.  If the air is dry, use a cold steam vaporizer or humidifier in your bedroom or your home to help loosen secretions.  Avoid anything that causes you to cough at work or at home.  If your cough is worse at night, try sleeping in a semi-upright position.  Avoid cigarette smoke. If you smoke, quit smoking. If you need help quitting, ask your health care provider.  Avoid caffeine.  Avoid alcohol.  Rest as needed. Contact a health care provider if:  You have new symptoms.  You cough up pus.  Your cough does not get better after 2-3 weeks, or your cough gets worse.  You cannot control your cough with suppressant medicines  and you are losing sleep.  You develop pain that is getting worse or pain that is not controlled with pain medicines.  You have a fever.  You have unexplained weight loss.  You have night sweats. Get help right away if:  You cough up blood.  You have difficulty breathing.  Your heartbeat is very fast. This information is not intended to replace advice given to you by your health care provider. Make sure you discuss any questions you have with your health care provider. Document Released: 12/30/2010 Document Revised: 12/09/2015 Document Reviewed: 09/09/2014 Elsevier Interactive Patient Education  2017 [employer].     IF you received an x-ray today, you will receive an invoice from [facility]. Please contact [facility] at [phone] with questions or concerns regarding your invoice.   IF you received labwork today, you will receive an invoice from [place]. Please contact [facility] at [phone] with questions or concerns regarding your invoice.   Our billing staff will not be able to assist you with questions regarding bills from these companies.  You will be contacted with the lab results as soon as they are available. The fastest way to get your results is to activate your My Chart account. Instructions are located on the last page of this paperwork. If you have not heard from [place] regarding the results in 2 weeks, please contact this office.

## 2016-07-18 NOTE — Progress Notes (Signed)
MRN: [id number] DOB: [birth year]  Subjective:   Emily Melton is a 56 y.o. female presenting for chief complaint of Pneumonia (possible pneumonia; x5 days); Cough (thick yellow sputum; now turned to green); and Nasal Congestion  Reports 5 day history of sinus congestion, sore throat, worsening productive cough. Cough has been associated with chest and back pain, racing heart beat, shob. Has used Cepacol, guaifenesin, salt water gargles, hydrating very well. Denies fever, sinus pain, wheezing. Has had bouts of bronchitis and pneumonia. Has used an inhaler in the past during these episodes with good relief. Has smoked 1ppd for ~35 years, stopped smoking ~3 years ago. Has a history of COPD managed with Dr. Annamaria Boots. HTN is managed by Dr. Tamala Julian. Has been tested twice for sleep apnea, does not have diagnosis. She does have episodes of hypoxia.   Emily Melton has a current medication list which includes the following prescription(s): amlodipine, cetirizine, clonazepam, dexlansoprazole, hydrochlorothiazide, hydrocodone-acetaminophen, hydromorphone, hyoscyamine, losartan, montelukast, omega-3 fatty acids, propranolol, vitamin d3, multivitamin, and psyllium. Also is allergic to duloxetine hcl; mirtazapine; serotonin reuptake inhibitors (ssris); st johns wort; bupropion hcl; celebrex [celecoxib]; effexor [venlafaxine]; lyrica [pregabalin]; methylphenidate hcl; mirapex [pramipexole]; modafinil; rabeprazole sodium; and lisinopril.  Emily Melton  has a past medical history of Allergic rhinitis, cause unspecified; Allergy; Anemia; Arthritis; Barrett's esophagus; Chicken pox; Depression; Diaphragmatic hernia without mention of obstruction or gangrene; Fibromyalgia; GERD (gastroesophageal reflux disease); HTN (hypertension); Insomnia, unspecified; Migraine, unspecified, without mention of intractable migraine without mention of status migrainosus; Narcolepsy; Nonspecific abnormal electrocardiogram (ECG) (EKG); Osteoporosis; Other  abnormal glucose; Periodic limb movement disorder; Personal history of colonic polyps; Tobacco use disorder; and Unspecified vitamin D deficiency. Also  has a past surgical history that includes Cesarean section; pilonidal cyst resection (1992); Cholecystectomy; carpal tunnel release - bilateral; Dilation and curettage of uterus; tubel ligation (2002); fusion of cervical spine (2007); laprascopy (2009); Vaginal hysterectomy (2009); R shoulder surgery (06/24/09); coccyx disorder (1997); Spine surgery (09/2011); Back surgery; Tubal ligation; Colonoscopy (03/17/2012); Esophagogastroduodenoscopy (03/17/2012); Breast excisional biopsy (2005); Abdominal hysterectomy; and Knee surgery.  Objective:   Vitals: BP 136/80 (BP Location: Left Arm, Patient Position: Sitting, Cuff Size: Large)   Pulse 61   Temp 98 F (36.7 C) (Oral)   Resp 18   Ht 5' 2"  (1.575 m)   Wt 215 lb (97.5 kg)   SpO2 95%   BMI 39.32 kg/m   Physical Exam  Constitutional: She is oriented to person, place, and time. She appears well-developed and well-nourished.  HENT:  TM's flat bilaterally but no effusions or erythema. Nasal turbinates dry but pink and mild frontal, maxillary sinus tenderness. Postnasal drip present but without oropharyngeal exudates, erythema or abscesses.  Eyes: Right eye exhibits no discharge. Left eye exhibits no discharge.  Neck: Normal range of motion. Neck supple.  Cardiovascular: Normal rate, regular rhythm and intact distal pulses.  Exam reveals no gallop and no friction rub.   No murmur heard. Pulmonary/Chest: No respiratory distress. She has no wheezes. She has no rales.  Lymphadenopathy:    She has cervical adenopathy (bilateral).  Neurological: She is alert and oriented to person, place, and time.   Dg Chest 2 View  Result Date: 07/18/2016 CLINICAL DATA:  Cough and chest pain.  Lung history of smoking EXAM: CHEST  2 VIEW COMPARISON:  Chest x-ray of March 29th 2017 FINDINGS: The lungs remain mildly  hyperinflated. There is stable scarring at the right lung base. There is no pleural effusion or pneumothorax. There is no evidence of pneumonia. The  heart and pulmonary vascularity are normal. The mediastinum is normal in width. The observed bony thorax exhibits no acute abnormality. The patient has undergone previous fusion procedures in the mid and lower cervical spine. IMPRESSION: Chronic bronchitic changes. No pneumonia, CHF, nor other acute cardiopulmonary abnormality. Electronically Signed   By: David  Martinique M.D.   On: 07/18/2016 12:41   Results for orders placed or performed in visit on 07/18/16 (from the past 24 hour(s))  POCT CBC     Status: Abnormal   Collection Time: 07/18/16 12:49 PM  Result Value Ref Range   WBC 7.6 4.6 - 10.2 K/uL   Lymph, poc 2.0 0.6 - 3.4   POC LYMPH PERCENT 26.3 10 - 50 %L   MID (cbc) 0.5 0 - 0.9   POC MID % 6.8 0 - 12 %M   POC Granulocyte 5.1 2 - 6.9   Granulocyte percent 66.9 37 - 80 %G   RBC 4.31 4.04 - 5.48 M/uL   Hemoglobin 13.2 12.2 - 16.2 g/dL   HCT, POC 37.6 (A) 37.7 - 47.9 %   MCV 87.1 80 - 97 fL   MCH, POC 30.6 27 - 31.2 pg   MCHC 35.1 31.8 - 35.4 g/dL   RDW, POC 13.2 %   Platelet Count, POC 151 142 - 424 K/uL   MPV 8.9 0 - 99.8 fL   Assessment and Plan :   1. Cough 2. Atypical chest pain 3. Viral URI 4. History of smoking 30 or more pack years - Will manage supportively for what I suspect is a viral syndrome. CBC and x-ray are reassuring. Recheck in 1 week if no improvement.  Jaynee Eagles, PA-C [facility] [phone] 07/18/2016 12:26 PM

## 2016-08-07 ENCOUNTER — Other Ambulatory Visit: Payer: Self-pay | Admitting: Gastroenterology

## 2016-08-07 DIAGNOSIS — K760 Fatty (change of) liver, not elsewhere classified: Secondary | ICD-10-CM

## 2016-08-28 DIAGNOSIS — Z9889 Other specified postprocedural states: Secondary | ICD-10-CM | POA: Insufficient documentation

## 2016-09-19 ENCOUNTER — Other Ambulatory Visit: Payer: Self-pay | Admitting: Internal Medicine

## 2016-09-19 ENCOUNTER — Other Ambulatory Visit: Payer: Self-pay | Admitting: Family Medicine

## 2016-09-19 DIAGNOSIS — Z1231 Encounter for screening mammogram for malignant neoplasm of breast: Secondary | ICD-10-CM

## 2016-09-19 NOTE — Telephone Encounter (Signed)
CY please advise of refill.  This was last refilled on 04/20/16 for #[id number] with 5 refills.  thanks

## 2016-09-19 NOTE — Telephone Encounter (Signed)
Ok refill x 6 months

## 2016-10-13 ENCOUNTER — Ambulatory Visit
Admission: RE | Admit: 2016-10-13 | Discharge: 2016-10-13 | Disposition: A | Discharge status: Home / Self Care | Payer: Commercial Managed Care - HMO | Source: Ambulatory Visit | Attending: Family Medicine | Admitting: Family Medicine

## 2016-10-13 DIAGNOSIS — Z1231 Encounter for screening mammogram for malignant neoplasm of breast: Secondary | ICD-10-CM | POA: Insufficient documentation

## 2016-10-13 DIAGNOSIS — N6489 Other specified disorders of breast: Secondary | ICD-10-CM | POA: Insufficient documentation

## 2016-10-25 ENCOUNTER — Ambulatory Visit
Admission: RE | Admit: 2016-10-25 | Discharge: 2016-10-25 | Disposition: A | Discharge status: Home / Self Care | Payer: Commercial Managed Care - HMO | Source: Ambulatory Visit | Attending: Gastroenterology | Admitting: Gastroenterology

## 2016-10-25 DIAGNOSIS — Z9049 Acquired absence of other specified parts of digestive tract: Secondary | ICD-10-CM | POA: Diagnosis not present

## 2016-10-25 DIAGNOSIS — K760 Fatty (change of) liver, not elsewhere classified: Secondary | ICD-10-CM | POA: Diagnosis present

## 2016-10-25 DIAGNOSIS — K838 Other specified diseases of biliary tract: Secondary | ICD-10-CM | POA: Insufficient documentation

## 2016-11-03 ENCOUNTER — Other Ambulatory Visit: Payer: Self-pay | Admitting: Gastroenterology

## 2016-11-03 DIAGNOSIS — K838 Other specified diseases of biliary tract: Secondary | ICD-10-CM

## 2016-11-03 DIAGNOSIS — R935 Abnormal findings on diagnostic imaging of other abdominal regions, including retroperitoneum: Secondary | ICD-10-CM

## 2016-11-13 ENCOUNTER — Ambulatory Visit
Admission: RE | Admit: 2016-11-13 | Discharge: 2016-11-13 | Disposition: A | Discharge status: Home / Self Care | Payer: Commercial Managed Care - HMO | Source: Ambulatory Visit | Attending: Gastroenterology | Admitting: Gastroenterology

## 2016-11-13 DIAGNOSIS — N289 Disorder of kidney and ureter, unspecified: Secondary | ICD-10-CM | POA: Diagnosis not present

## 2016-11-13 DIAGNOSIS — Z9049 Acquired absence of other specified parts of digestive tract: Secondary | ICD-10-CM | POA: Diagnosis not present

## 2016-11-13 DIAGNOSIS — K838 Other specified diseases of biliary tract: Secondary | ICD-10-CM | POA: Insufficient documentation

## 2016-11-13 DIAGNOSIS — R935 Abnormal findings on diagnostic imaging of other abdominal regions, including retroperitoneum: Secondary | ICD-10-CM | POA: Insufficient documentation

## 2016-11-13 DIAGNOSIS — K760 Fatty (change of) liver, not elsewhere classified: Secondary | ICD-10-CM | POA: Insufficient documentation

## 2016-11-13 LAB — POCT I-STAT CREATININE: Creatinine, Ser: 0.7 mg/dL (ref 0.44–1.00)

## 2016-11-13 MED ORDER — GADOBENATE DIMEGLUMINE 529 MG/ML IV SOLN
20.0000 mL | Freq: Once | INTRAVENOUS | Status: AC | PRN
  Administered 2016-11-13: 20 mL via INTRAVENOUS

## 2016-11-21 ENCOUNTER — Ambulatory Visit (INDEPENDENT_AMBULATORY_CARE_PROVIDER_SITE_OTHER): Payer: Commercial Managed Care - HMO | Admitting: Family Medicine

## 2016-11-21 ENCOUNTER — Encounter: Payer: Self-pay | Admitting: Family Medicine

## 2016-11-21 VITALS — BP 133/80 | HR 65 | Temp 98.0°F | Resp 18 | Ht 61.5 in | Wt 217.6 lb

## 2016-11-21 DIAGNOSIS — F5101 Primary insomnia: Secondary | ICD-10-CM | POA: Diagnosis not present

## 2016-11-21 DIAGNOSIS — R7302 Impaired glucose tolerance (oral): Secondary | ICD-10-CM | POA: Diagnosis not present

## 2016-11-21 DIAGNOSIS — K7581 Nonalcoholic steatohepatitis (NASH): Secondary | ICD-10-CM

## 2016-11-21 DIAGNOSIS — I10 Essential (primary) hypertension: Secondary | ICD-10-CM | POA: Diagnosis not present

## 2016-11-21 DIAGNOSIS — J449 Chronic obstructive pulmonary disease, unspecified: Secondary | ICD-10-CM | POA: Diagnosis not present

## 2016-11-21 DIAGNOSIS — M5030 Other cervical disc degeneration, unspecified cervical region: Secondary | ICD-10-CM | POA: Diagnosis not present

## 2016-11-21 DIAGNOSIS — E7800 Pure hypercholesterolemia, unspecified: Secondary | ICD-10-CM

## 2016-11-21 NOTE — Patient Instructions (Addendum)
IF you received an x-ray today, you will receive an invoice from [facility]. Please contact [facility] at [phone] with questions or concerns regarding your invoice.   IF you received labwork today, you will receive an invoice from [place]. Please contact [facility] at [phone] with questions or concerns regarding your invoice.   Our billing staff will not be able to assist you with questions regarding bills from these companies.  You will be contacted with the lab results as soon as they are available. The fastest way to get your results is to activate your My Chart account. Instructions are located on the last page of this paperwork. If you have not heard from [place] regarding the results in 2 weeks, please contact this office.     Calorie Counting for Weight Loss Calories are units of energy. Your body needs a certain amount of calories from food to keep you going throughout the day. When you eat more calories than your body needs, your body stores the extra calories as fat. When you eat fewer calories than your body needs, your body burns fat to get the energy it needs. Calorie counting means keeping track of how many calories you eat and drink each day. Calorie counting can be helpful if you need to lose weight. If you make sure to eat fewer calories than your body needs, you should lose weight. Ask your health care provider what a healthy weight is for you. For calorie counting to work, you will need to eat the right number of calories in a day in order to lose a healthy amount of weight per week. A dietitian can help you determine how many calories you need in a day and will give you suggestions on how to reach your calorie goal.  A healthy amount of weight to lose per week is usually 1-2 lb (0.5-0.9 kg). This usually means that your daily calorie intake should be reduced by 500-750 calories.  Eating 1,200 - 1,500 calories per day can help most women lose  weight.  Eating 1,500 - 1,800 calories per day can help most men lose weight. What is my plan? My goal is to have __1200-1500 kcal_ calories per day. If I have this many calories per day, I should lose around _____2_____ pounds per week. What do I need to know about calorie counting? In order to meet your daily calorie goal, you will need to:  Find out how many calories are in each food you would like to eat. Try to do this before you eat.  Decide how much of the food you plan to eat.  Write down what you ate and how many calories it had. Doing this is called keeping a food log. To successfully lose weight, it is important to balance calorie counting with a healthy lifestyle that includes regular activity. Aim for 150 minutes of moderate exercise (such as walking) or 75 minutes of vigorous exercise (such as running) each week. Where do I find calorie information?   The number of calories in a food can be found on a Nutrition Facts label. If a food does not have a Nutrition Facts label, try to look up the calories online or ask your dietitian for help. Remember that calories are listed per serving. If you choose to have more than one serving of a food, you will have to multiply the calories per serving by the amount of servings you plan to eat. For example, the label on a package  of bread might say that a serving size is 1 slice and that there are 90 calories in a serving. If you eat 1 slice, you will have eaten 90 calories. If you eat 2 slices, you will have eaten 180 calories. How do I keep a food log? Immediately after each meal, record the following information in your food log:  What you ate. Don't forget to include toppings, sauces, and other extras on the food.  How much you ate. This can be measured in cups, ounces, or number of items.  How many calories each food and drink had.  The total number of calories in the meal. Keep your food log near you, such as in a small notebook in  your pocket, or use a mobile app or website. Some programs will calculate calories for you and show you how many calories you have left for the day to meet your goal. What are some calorie counting tips?  Use your calories on foods and drinks that will fill you up and not leave you hungry:  Some examples of foods that fill you up are nuts and nut butters, vegetables, lean proteins, and high-fiber foods like whole grains. High-fiber foods are foods with more than 5 g fiber per serving.  Drinks such as sodas, specialty coffee drinks, alcohol, and juices have a lot of calories, yet do not fill you up.  Eat nutritious foods and avoid empty calories. Empty calories are calories you get from foods or beverages that do not have many vitamins or protein, such as candy, sweets, and soda. It is better to have a nutritious high-calorie food (such as an avocado) than a food with few nutrients (such as a bag of chips).  Know how many calories are in the foods you eat most often. This will help you calculate calorie counts faster.  Pay attention to calories in drinks. Low-calorie drinks include water and unsweetened drinks.  Pay attention to nutrition labels for "low fat" or "fat free" foods. These foods sometimes have the same amount of calories or more calories than the full fat versions. They also often have added sugar, starch, or salt, to make up for flavor that was removed with the fat.  Find a way of tracking calories that works for you. Get creative. Try different apps or programs if writing down calories does not work for you. What are some portion control tips?  Know how many calories are in a serving. This will help you know how many servings of a certain food you can have.  Use a measuring cup to measure serving sizes. You could also try weighing out portions on a kitchen scale. With time, you will be able to estimate serving sizes for some foods.  Take some time to put servings of different  foods on your favorite plates, bowls, and cups so you know what a serving looks like.  Try not to eat straight from a bag or box. Doing this can lead to overeating. Put the amount you would like to eat in a cup or on a plate to make sure you are eating the right portion.  Use smaller plates, glasses, and bowls to prevent overeating.  Try not to multitask (for example, watch TV or use your computer) while eating. If it is time to eat, sit down at a table and enjoy your food. This will help you to know when you are full. It will also help you to be aware of what you are eating  and how much you are eating. What are tips for following this plan? Reading food labels   Check the calorie count compared to the serving size. The serving size may be smaller than what you are used to eating.  Check the source of the calories. Make sure the food you are eating is high in vitamins and protein and low in saturated and trans fats. Shopping   Read nutrition labels while you shop. This will help you make healthy decisions before you decide to purchase your food.  Make a grocery list and stick to it. Cooking   Try to cook your favorite foods in a healthier way. For example, try baking instead of frying.  Use low-fat dairy products. Meal planning   Use more fruits and vegetables. Half of your plate should be fruits and vegetables.  Include lean proteins like poultry and fish. How do I count calories when eating out?  Ask for smaller portion sizes.  Consider sharing an entree and sides instead of getting your own entree.  If you get your own entree, eat only half. Ask for a box at the beginning of your meal and put the rest of your entree in it so you are not tempted to eat it.  If calories are listed on the menu, choose the lower calorie options.  Choose dishes that include vegetables, fruits, whole grains, low-fat dairy products, and lean protein.  Choose items that are boiled, broiled,  grilled, or steamed. Stay away from items that are buttered, battered, fried, or served with cream sauce. Items labeled "crispy" are usually fried, unless stated otherwise.  Choose water, low-fat milk, unsweetened iced tea, or other drinks without added sugar. If you want an alcoholic beverage, choose a lower calorie option such as a glass of wine or light beer.  Ask for dressings, sauces, and syrups on the side. These are usually high in calories, so you should limit the amount you eat.  If you want a salad, choose a garden salad and ask for grilled meats. Avoid extra toppings like bacon, cheese, or fried items. Ask for the dressing on the side, or ask for olive oil and vinegar or lemon to use as dressing.  Estimate how many servings of a food you are given. For example, a serving of cooked rice is  cup or about the size of half a baseball. Knowing serving sizes will help you be aware of how much food you are eating at restaurants. The list below tells you how big or small some common portion sizes are based on everyday objects:  1 oz-4 stacked dice.  3 oz-1 deck of cards.  1 tsp-1 die.  1 Tbsp- a ping-pong ball.  2 Tbsp-1 ping-pong ball.   cup- baseball.  1 cup-1 baseball. Summary  Calorie counting means keeping track of how many calories you eat and drink each day. If you eat fewer calories than your body needs, you should lose weight.  A healthy amount of weight to lose per week is usually 1-2 lb (0.5-0.9 kg). This usually means reducing your daily calorie intake by 500-750 calories.  The number of calories in a food can be found on a Nutrition Facts label. If a food does not have a Nutrition Facts label, try to look up the calories online or ask your dietitian for help.  Use your calories on foods and drinks that will fill you up, and not on foods and drinks that will leave you hungry.  Use smaller plates,  glasses, and bowls to prevent overeating. This information is not  intended to replace advice given to you by your health care provider. Make sure you discuss any questions you have with your health care provider. Document Released: 07/03/2005 Document Revised: 06/02/2016 Document Reviewed: 06/02/2016 Elsevier Interactive Patient Education  2017 [employer].

## 2016-11-21 NOTE — Progress Notes (Signed)
Subjective:    Patient ID: Emily Melton, female    DOB: 11-04-1960, 56 y.o.   MRN: [id number]  11/21/2016  Follow-up (Hypertension, Discuss labs, recheck calcium. Uses [facility] for short term or immediate prescription.)   HPI This 56 y.o. female presents for six month follow-up for hypertension, hypercholesterolemia, glucose intolerance.  Calcium elevated at last visit to 11.1; recommended stopping vitamin D supplementation; if remains elevated, may need to stop HCTZ.  Brought in home BP readings for review.  January 2: URI; no abx; recovered.  January 16: eye exam; new glasses Janu 16: GI appointment; follow-up from [place] that showed fatty liver and fibrosis; recommended repeating US to evaluate [place] further; called for results; no masses but CBD enlarged so recommended MRI with MRCP.  Previous MRCP that was negative.  s/p MRCP last week.  Recommended full liver panel and GGT.  Fax to [facility] [phone] Dr. Gustavo Lah.  Scheduled for colonoscopy 12/08/16.    R knee is improved.  R elbow sprain: got a new bamboo pillow; Dr. Grandville Silos; dx with lateral epicondylitis; steroid dose pack and prescribed Mobic/Meloxicam.  Recommend PT for four weeks.  Immunization History  Administered Date(s) Administered  . Influenza Split 06/21/2011  . Influenza,inj,Quad PF,36+ Mos 04/30/2013, 05/22/2014, 04/05/2015, 04/20/2016  . Pneumococcal Polysaccharide-23 07/18/2011  . Tdap 08/01/2010   BP Readings from Last 3 Encounters:  11/21/16 133/80  07/18/16 136/80  05/24/16 122/82   Wt Readings from Last 3 Encounters:  11/21/16 217 lb 9.6 oz (98.7 kg)  07/18/16 215 lb (97.5 kg)  05/24/16 212 lb (96.2 kg)     Review of Systems  Constitutional: Negative for chills, diaphoresis, fatigue and fever.  Eyes: Negative for visual disturbance.  Respiratory: Negative for cough and shortness of breath.   Cardiovascular: Negative for chest pain, palpitations and leg swelling.    Gastrointestinal: Negative for abdominal pain, constipation, diarrhea, nausea and vomiting.  Endocrine: Negative for cold intolerance, heat intolerance, polydipsia, polyphagia and polyuria.  Musculoskeletal: Positive for arthralgias, back pain, myalgias, neck pain and neck stiffness.  Neurological: Negative for dizziness, tremors, seizures, syncope, facial asymmetry, speech difficulty, weakness, light-headedness, numbness and headaches.  Psychiatric/Behavioral: Positive for dysphoric mood.    Past Medical History:  Diagnosis Date  . Allergic rhinitis, cause unspecified   . Allergy   . Anemia   . Arthritis    C-spine; 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 Barrett's esophagus   . Chicken pox    childhood  . Depression   . Diaphragmatic hernia without mention of obstruction or gangrene   . Fibromyalgia   . GERD (gastroesophageal reflux disease)   . HTN (hypertension)   . Insomnia, unspecified   . Migraine, unspecified, without mention of intractable migraine without mention of status migrainosus   . Narcolepsy    w/o cataplexy  . Nonspecific abnormal electrocardiogram (ECG) (EKG)   . Osteoporosis   . Other abnormal glucose   . Periodic limb movement disorder   . Personal history of colonic polyps   . Tobacco use disorder   . Unspecified vitamin D deficiency    Past Surgical History:  Procedure Laterality Date  . ABDOMINAL HYSTERECTOMY     fibroids and endometriosis.  ovarian resection R.  . BACK SURGERY    . BREAST EXCISIONAL BIOPSY  2005    R   Benign  . carpal tunnel release - bilateral    . CESAREAN SECTION  x2  1984 and 1986  . CHOLECYSTECTOMY    . 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 COLONOSCOPY  03/17/2012   [facility]  . DILATION AND CURETTAGE OF UTERUS     SAB  . ESOPHAGOGASTRODUODENOSCOPY  03/17/2012   no Barrett's esophagus. [facility].  . fusion of cervical spine  2007   C5,C6,C7, and T1  . KNEE SURGERY    .  laprascopy  2009   for RLQ pain:R oophorectomy performed no improvement in pain.   . pilonidal cyst resection  1992  . R shoulder surgery  06/24/09   2 tears in rotator cuff,bone spurs. Dr. Modena Morrow  . SPINE SURGERY  09/2011   Cervical spine fusion  . TUBAL LIGATION    . tubel ligation  2002  . VAGINAL HYSTERECTOMY  2009   fibroid/endometriosis, one L ovary remaining   Allergies  Allergen Reactions  . Duloxetine Hcl Other (See Comments)    Hypertension, mood changes  . Mirtazapine Swelling  . Serotonin Reuptake Inhibitors (Ssris) Other (See Comments)    Hypertension, mood changes  . St Johns Wort Anaphylaxis  . Bupropion Hcl Other (See Comments)    Mental changes and high blood pressure   . Celebrex [Celecoxib] Hypertension  . Effexor [Venlafaxine] Other (See Comments)    Moodiness, vivid dreams, poor sleep cycle.  Recardo Evangelist [Pregabalin] Other (See Comments) and Hypertension    Mood changes, eye twitching and UTI  . Methylphenidate Hcl Other (See Comments)    unknown  . Mirapex [Pramipexole]     HTN, hallucinations and nausea  . Modafinil Swelling and Hypertension  . Rabeprazole Sodium Swelling and Other (See Comments)    Headaches   . Lisinopril Rash    Hypertension    Social History   Social History  . Marital status: Married    Spouse name: N/A  . Number of children: 2  . Years of education: High Schol   Occupational History  . Disabled [employer]   Social History Main Topics  . Smoking status: Former Smoker    Packs/day: 1.00    Years: 36.00    Types: Cigarettes    Quit date: 11/28/2013  . Smokeless tobacco: Former Systems developer    Quit date: 11/19/2013     Comment: smoked 1 ppd 30-35 years; quit in 2011  . Alcohol use No  . Drug use: Yes    Types: Flunitrazepam  . Sexual activity: Yes    Birth control/ protection: Surgical   Other Topics Concern  . Not on file   Social History Narrative   Patient exercises 3-4 times per week (walking x 20 minutes 3 days  a week).    Marital status:  Married x 11 years; happily married; second marriage; no abuse.      Children:  2 children(31, 29); three grandsons.      Employment:  Long term disability through work; formal disability 12/2015.  Quit working 02/2013.      Tobacco:  1/2 ppd x 34 years.; quit smoking 11/2013.       Alcohol: rare;        Drugs: none      Exercise:  Rare       lives in [place].       Seatbelt:  100%       Guns: none   1-2 cups of coffee a day/ rare occasional Pepsi       Family History  Problem Relation Age of Onset  .  Bipolar disorder Mother   . Cancer Mother     bladder  . Hypertension Mother   . Colon polyps Mother   . Osteoarthritis Mother   . Diabetes Mother   . Thyroid disease Mother   . Heart disease Father 18    CAD/AMI with stenting/PAD.  Marland Kitchen Hyperlipidemia Father   . Cancer Father     skin  . Angina Father   . Asthma Daughter   . Cancer Maternal Grandmother     colon  . Heart disease Maternal Grandmother   . Cancer Maternal Grandfather     lung  . Parkinson's disease Maternal Grandfather   . Stroke Maternal Grandfather   . Diabetes Maternal Grandfather   . Hyperlipidemia Maternal Grandfather   . Rheum arthritis Paternal Grandmother   . Heart attack Paternal Grandfather   . Pulmonary fibrosis Paternal Aunt   . Pulmonary fibrosis Paternal Uncle        Objective:    BP 133/80   Pulse 65   Temp 98 F (36.7 C) (Oral)   Resp 18   Ht 5' 1.5" (1.562 m)   Wt 217 lb 9.6 oz (98.7 kg)   SpO2 95%   BMI 40.45 kg/m  Physical Exam  Constitutional: She is oriented to person, place, and time. She appears well-developed and well-nourished. No distress.  HENT:  Head: Normocephalic and atraumatic.  Right Ear: External ear normal.  Left Ear: External ear normal.  Nose: Nose normal.  Mouth/Throat: Oropharynx is clear and moist.  Eyes: Conjunctivae and EOM are normal. Pupils are equal, round, and reactive to light.  Neck: Normal range of motion. Neck  supple. Carotid bruit is not present. No thyromegaly present.  Cardiovascular: Normal rate, regular rhythm, normal heart sounds and intact distal pulses.  Exam reveals no gallop and no friction rub.   No murmur heard. Pulmonary/Chest: Effort normal and breath sounds normal. She has no wheezes. She has no rales.  Abdominal: Soft. Bowel sounds are normal. She exhibits no distension and no mass. There is no tenderness. There is no rebound and no guarding.  Lymphadenopathy:    She has no cervical adenopathy.  Neurological: She is alert and oriented to person, place, and time. No cranial nerve deficit.  Skin: Skin is warm and dry. No rash noted. She is not diaphoretic. No erythema. No pallor.  Psychiatric: She has a normal mood and affect. Her behavior is normal.        Assessment & Plan:   1. Essential hypertension, benign   2. COPD mixed type ([place])   3. Glucose intolerance (impaired glucose tolerance)   4. DDD (degenerative disc disease), cervical   5. Primary insomnia   6. Pure hypercholesterolemia   7. Hypercalcemia   8. NASH (nonalcoholic steatohepatitis)    -stable/controlled; obtain labs. -new onset hypercalcemia at last visit; has been holding Vitamin D level since last visit; repeat with intact PTH.  Orders Placed This Encounter  Procedures  . CBC with Differential/Platelet  . Comprehensive metabolic panel    Order Specific Question:   Has the patient fasted?    Answer:   Yes  . Hemoglobin A1c  . Lipid panel    Order Specific Question:   Has the patient fasted?    Answer:   Yes  . Parathyroid hormone, intact (no Ca)  . Gamma GT  . TSH   No orders of the defined types were placed in this encounter.   Return in about 6 months (around 05/24/2017) for complete  physical examiniation.   Kristi Elayne Guerin, M.D. Primary Care at [facility] [address] [place], [place]  [address] [phone] phone [phone]  fax

## 2016-11-22 LAB — COMPREHENSIVE METABOLIC PANEL
ALT: 38 IU/L — ABNORMAL HIGH (ref 0–32)
AST: 29 IU/L (ref 0–40)
Albumin/Globulin Ratio: 1.8 (ref 1.2–2.2)
Albumin: 4.4 g/dL (ref 3.5–5.5)
Alkaline Phosphatase: 109 IU/L (ref 39–117)
BUN/Creatinine Ratio: 24 — ABNORMAL HIGH (ref 9–23)
BUN: 12 mg/dL (ref 6–24)
Bilirubin Total: 0.4 mg/dL (ref 0.0–1.2)
CO2: 27 mmol/L (ref 18–29)
Calcium: 11.2 mg/dL — ABNORMAL HIGH (ref 8.7–10.2)
Chloride: 101 mmol/L (ref 96–106)
Creatinine, Ser: 0.49 mg/dL — ABNORMAL LOW (ref 0.57–1.00)
GFR calc Af Amer: 128 mL/min/{1.73_m2} (ref >59)
GFR calc non Af Amer: 111 mL/min/{1.73_m2} (ref >59)
Globulin, Total: 2.4 g/dL (ref 1.5–4.5)
Glucose: 100 mg/dL — ABNORMAL HIGH (ref 65–99)
Potassium: 4.3 mmol/L (ref 3.5–5.2)
Sodium: 141 mmol/L (ref 134–144)
Total Protein: 6.8 g/dL (ref 6.0–8.5)

## 2016-11-22 LAB — LIPID PANEL
Chol/HDL Ratio: 4.2 ratio (ref 0.0–4.4)
Cholesterol, Total: 193 mg/dL (ref 100–199)
HDL: 46 mg/dL (ref >39)
LDL Calculated: 100 mg/dL — ABNORMAL HIGH (ref 0–99)
Triglycerides: 233 mg/dL — ABNORMAL HIGH (ref 0–149)
VLDL Cholesterol Cal: 47 mg/dL — ABNORMAL HIGH (ref 5–40)

## 2016-11-22 LAB — CBC WITH DIFFERENTIAL/PLATELET
Basophils Absolute: 0 10*3/uL (ref 0.0–0.2)
Basos: 0 %
EOS (ABSOLUTE): 0.1 10*3/uL (ref 0.0–0.4)
Eos: 2 %
Hematocrit: 39.9 % (ref 34.0–46.6)
Hemoglobin: 13.1 g/dL (ref 11.1–15.9)
Lymphocytes Absolute: 2.2 10*3/uL (ref 0.7–3.1)
Lymphs: 42 %
MCH: 29.4 pg (ref 26.6–33.0)
MCHC: 32.8 g/dL (ref 31.5–35.7)
MCV: 90 fL (ref 79–97)
Monocytes Absolute: 0.2 10*3/uL (ref 0.1–0.9)
Monocytes: 4 %
Neutrophils Absolute: 2.7 10*3/uL (ref 1.4–7.0)
Neutrophils: 52 %
Platelets: 190 10*3/uL (ref 150–379)
RBC: 4.46 x10E6/uL (ref 3.77–5.28)
RDW: 14 % (ref 12.3–15.4)
WBC: 5.2 10*3/uL (ref 3.4–10.8)

## 2016-11-22 LAB — HEMOGLOBIN A1C
Est. average glucose Bld gHb Est-mCnc: 105 mg/dL
Hgb A1c MFr Bld: 5.3 % (ref 4.8–5.6)

## 2016-11-22 LAB — GAMMA GT: GGT: 18 IU/L (ref 0–60)

## 2016-11-22 LAB — PARATHYROID HORMONE, INTACT (NO CA): PTH: 43 pg/mL (ref 15–65)

## 2016-11-22 LAB — TSH: TSH: 2.07 u[IU]/mL (ref 0.450–4.500)

## 2016-11-29 ENCOUNTER — Telehealth: Payer: Self-pay | Admitting: Family Medicine

## 2016-11-29 NOTE — Telephone Encounter (Signed)
Pt is looking for lab results   Best number [phone]

## 2016-11-30 NOTE — Telephone Encounter (Signed)
See abn lipid and cmp

## 2016-12-07 ENCOUNTER — Encounter: Payer: Self-pay | Admitting: *Deleted

## 2016-12-07 ENCOUNTER — Telehealth: Payer: Self-pay | Admitting: Family Medicine

## 2016-12-07 NOTE — Telephone Encounter (Signed)
Pt is needing to talk with someone regarding her lab results   Best number [phone]

## 2016-12-07 NOTE — Telephone Encounter (Signed)
Please see abn lipid and cmp and advise

## 2016-12-08 ENCOUNTER — Ambulatory Visit
Admission: RE | Admit: 2016-12-08 | Discharge: 2016-12-08 | Disposition: A | Discharge status: Home / Self Care | Payer: Commercial Managed Care - HMO | Source: Ambulatory Visit | Attending: Gastroenterology | Admitting: Gastroenterology

## 2016-12-08 ENCOUNTER — Ambulatory Visit: Payer: Commercial Managed Care - HMO | Admitting: Anesthesiology

## 2016-12-08 ENCOUNTER — Encounter: Payer: Self-pay | Admitting: *Deleted

## 2016-12-08 ENCOUNTER — Encounter
Admission: RE | Disposition: A | Discharge status: Home / Self Care | Payer: Self-pay | Source: Ambulatory Visit | Attending: Gastroenterology

## 2016-12-08 DIAGNOSIS — K635 Polyp of colon: Secondary | ICD-10-CM | POA: Diagnosis not present

## 2016-12-08 DIAGNOSIS — J449 Chronic obstructive pulmonary disease, unspecified: Secondary | ICD-10-CM | POA: Insufficient documentation

## 2016-12-08 DIAGNOSIS — Z1211 Encounter for screening for malignant neoplasm of colon: Secondary | ICD-10-CM | POA: Diagnosis not present

## 2016-12-08 DIAGNOSIS — E559 Vitamin D deficiency, unspecified: Secondary | ICD-10-CM | POA: Insufficient documentation

## 2016-12-08 DIAGNOSIS — Z8371 Family history of colonic polyps: Secondary | ICD-10-CM | POA: Diagnosis not present

## 2016-12-08 DIAGNOSIS — D124 Benign neoplasm of descending colon: Secondary | ICD-10-CM | POA: Insufficient documentation

## 2016-12-08 DIAGNOSIS — I10 Essential (primary) hypertension: Secondary | ICD-10-CM | POA: Insufficient documentation

## 2016-12-08 DIAGNOSIS — Z79899 Other long term (current) drug therapy: Secondary | ICD-10-CM | POA: Diagnosis not present

## 2016-12-08 DIAGNOSIS — K644 Residual hemorrhoidal skin tags: Secondary | ICD-10-CM | POA: Insufficient documentation

## 2016-12-08 DIAGNOSIS — J309 Allergic rhinitis, unspecified: Secondary | ICD-10-CM | POA: Insufficient documentation

## 2016-12-08 DIAGNOSIS — Z87891 Personal history of nicotine dependence: Secondary | ICD-10-CM | POA: Insufficient documentation

## 2016-12-08 DIAGNOSIS — K640 First degree hemorrhoids: Secondary | ICD-10-CM | POA: Insufficient documentation

## 2016-12-08 DIAGNOSIS — K219 Gastro-esophageal reflux disease without esophagitis: Secondary | ICD-10-CM | POA: Diagnosis not present

## 2016-12-08 DIAGNOSIS — K621 Rectal polyp: Secondary | ICD-10-CM | POA: Insufficient documentation

## 2016-12-08 DIAGNOSIS — M810 Age-related osteoporosis without current pathological fracture: Secondary | ICD-10-CM | POA: Insufficient documentation

## 2016-12-08 DIAGNOSIS — G4700 Insomnia, unspecified: Secondary | ICD-10-CM | POA: Diagnosis not present

## 2016-12-08 DIAGNOSIS — Z86010 Personal history of colonic polyps: Secondary | ICD-10-CM | POA: Diagnosis not present

## 2016-12-08 DIAGNOSIS — K5730 Diverticulosis of large intestine without perforation or abscess without bleeding: Secondary | ICD-10-CM | POA: Insufficient documentation

## 2016-12-08 HISTORY — PX: COLONOSCOPY WITH PROPOFOL: SHX5780

## 2016-12-08 HISTORY — DX: Radiculopathy, cervical region: M54.12

## 2016-12-08 HISTORY — DX: Postlaminectomy syndrome, not elsewhere classified: M96.1

## 2016-12-08 SURGERY — COLONOSCOPY WITH PROPOFOL
Anesthesia: General

## 2016-12-08 MED ORDER — MIDAZOLAM HCL 2 MG/2ML IJ SOLN
INTRAMUSCULAR | Status: DC | PRN
  Administered 2016-12-08: 1.5 mg via INTRAVENOUS

## 2016-12-08 MED ORDER — DEXAMETHASONE SODIUM PHOSPHATE 10 MG/ML IJ SOLN
INTRAMUSCULAR | Status: AC
  Filled 2016-12-08: qty 1

## 2016-12-08 MED ORDER — PROPOFOL 10 MG/ML IV BOLUS
INTRAVENOUS | Status: AC
  Filled 2016-12-08: qty 20

## 2016-12-08 MED ORDER — PROPOFOL 500 MG/50ML IV EMUL
INTRAVENOUS | Status: AC
  Filled 2016-12-08: qty 50

## 2016-12-08 MED ORDER — SODIUM CHLORIDE 0.9 % IV SOLN: INTRAVENOUS | Status: DC

## 2016-12-08 MED ORDER — FENTANYL CITRATE (PF) 100 MCG/2ML IJ SOLN
INTRAMUSCULAR | Status: AC
  Filled 2016-12-08: qty 2

## 2016-12-08 MED ORDER — DEXAMETHASONE SODIUM PHOSPHATE 10 MG/ML IJ SOLN
INTRAMUSCULAR | Status: DC | PRN
  Administered 2016-12-08: 8 mg via INTRAVENOUS

## 2016-12-08 MED ORDER — SODIUM CHLORIDE 0.9 % IV SOLN
INTRAVENOUS | Status: DC
  Administered 2016-12-08 (×2): via INTRAVENOUS

## 2016-12-08 MED ORDER — PROPOFOL 10 MG/ML IV BOLUS
INTRAVENOUS | Status: DC | PRN
  Administered 2016-12-08: 30 mg via INTRAVENOUS
  Administered 2016-12-08: 40 mg via INTRAVENOUS

## 2016-12-08 MED ORDER — ONDANSETRON HCL 4 MG/2ML IJ SOLN
INTRAMUSCULAR | Status: DC | PRN
  Administered 2016-12-08: 4 mg via INTRAVENOUS

## 2016-12-08 MED ORDER — MIDAZOLAM HCL 2 MG/2ML IJ SOLN
INTRAMUSCULAR | Status: AC
  Filled 2016-12-08: qty 2

## 2016-12-08 MED ORDER — PROPOFOL 500 MG/50ML IV EMUL
INTRAVENOUS | Status: DC | PRN
  Administered 2016-12-08: 150 ug/kg/min via INTRAVENOUS

## 2016-12-08 MED ORDER — FENTANYL CITRATE (PF) 100 MCG/2ML IJ SOLN
INTRAMUSCULAR | Status: DC | PRN
Start: 2016-12-08 — End: 2016-12-08
  Administered 2016-12-08: 50 ug via INTRAVENOUS

## 2016-12-08 NOTE — Anesthesia Preprocedure Evaluation (Signed)
Anesthesia Evaluation  Patient identified by MRN, date of birth, ID band Patient awake    Reviewed: Allergy & Precautions, NPO status , Patient's Chart, lab work & pertinent test results, reviewed documented beta blocker date and time   Airway Mallampati: III  TM Distance: >3 FB     Dental  (+) Chipped   Pulmonary COPD, former smoker,           Cardiovascular hypertension, Pt. on medications and Pt. on home beta blockers      Neuro/Psych  Headaches, PSYCHIATRIC DISORDERS Depression    GI/Hepatic GERD  ,  Endo/Other    Renal/GU      Musculoskeletal  (+) Arthritis , Fibromyalgia -  Abdominal   Peds  Hematology  (+) anemia ,   Anesthesia Other Findings Neck movement OK.  Reproductive/Obstetrics                             Anesthesia Physical Anesthesia Plan  ASA: III  Anesthesia Plan: General   Post-op Pain Management:    Induction: Intravenous  Airway Management Planned:   Additional Equipment:   Intra-op Plan:   Post-operative Plan:   Informed Consent: I have reviewed the patients History and Physical, chart, labs and discussed the procedure including the risks, benefits and alternatives for the proposed anesthesia with the patient or authorized representative who has indicated his/her understanding and acceptance.     Plan Discussed with: CRNA  Anesthesia Plan Comments:         Anesthesia Quick Evaluation

## 2016-12-08 NOTE — Anesthesia Post-op Follow-up Note (Cosign Needed)
Anesthesia QCDR form completed.        

## 2016-12-08 NOTE — Op Note (Signed)
[facility] Gastroenterology Patient Name: Emily Melton Procedure Date: 12/08/2016 10:52 AM MRN: [id number] Account #: [id number] Date of Birth: Feb 19, 1961 Admit Type: Outpatient Age: 56 Room: [facility] ENDO ROOM 1 Gender: Female Note Status: Finalized Procedure:            Colonoscopy Indications:          High risk colon cancer surveillance: Personal history                        of colonic polyps, Family history of colonic polyps in                        a first-degree relative Providers:            Lollie Sails, MD Referring MD:         Renette Butters. Tamala Julian, MD (Referring MD) Complications:        No immediate complications. Procedure:            Pre-Anesthesia Assessment:                       - ASA Grade Assessment: III - A patient with severe                        systemic disease.                       After obtaining informed consent, the colonoscope was                        passed under direct vision. Throughout the procedure,                        the patient's blood pressure, pulse, and oxygen                        saturations were monitored continuously. The                        Colonoscope was introduced through the anus and                        advanced to the the cecum, identified by appendiceal                        orifice and ileocecal valve. The colonoscopy was                        performed with moderate difficulty due to a tortuous                        colon. Successful completion of the procedure was aided                        by changing the patient to a supine position, changing                        the patient to a prone position and using manual                        pressure. The patient  tolerated the procedure well. The                        quality of the bowel preparation was good except the                        ascending colon was poor. Findings:      A few medium to small-mouthed diverticula were found in the  sigmoid       colon and descending colon.      A 11 mm polyp was found in the proximal descending colon. The polyp was       sessile. The polyp was removed with a cold biopsy forceps. The polyp was       removed with a cold snare. The polyp was removed with a lift and cut       technique using a cold snare. Resection and retrieval were complete.      A 3 mm polyp was found in the mid sigmoid colon. The polyp was sessile.       The polyp was removed with a cold biopsy forceps. Resection and       retrieval were complete.      A 3 mm polyp was found in the distal sigmoid colon. The polyp was       sessile. The polyp was removed with a cold biopsy forceps. Resection and       retrieval were complete.      Three sessile polyps were found in the distal sigmoid colon. The polyps       were 2 to 4 mm in size. These polyps were removed with a cold snare.       Resection and retrieval were complete.      Three sessile polyps were found in the rectum. The polyps were 1 to 2 mm       in size. These polyps were removed with a cold biopsy forceps. Resection       and retrieval were complete.      Non-bleeding external and internal hemorrhoids were found during       anoscopy. The hemorrhoids were small and Grade I (internal hemorrhoids       that do not prolapse). Impression:           - Diverticulosis in the sigmoid colon and in the                        descending colon.                       - One 11 mm polyp in the proximal descending colon,                        removed with a cold snare, removed using lift and cut                        and a cold snare and removed with a cold biopsy                        forceps. Resected and retrieved.                       - One 3 mm polyp in the mid sigmoid colon, removed with  a cold biopsy forceps. Resected and retrieved.                       - One 3 mm polyp in the distal sigmoid colon, removed                        with a  cold biopsy forceps. Resected and retrieved.                       - Three 2 to 4 mm polyps in the distal sigmoid colon,                        removed with a cold snare. Resected and retrieved.                       - Three 1 to 2 mm polyps in the rectum, removed with a                        cold biopsy forceps. Resected and retrieved.                       - Non-bleeding external and internal hemorrhoids. Recommendation:       - Await pathology results.                       - Telephone GI clinic for pathology results in 1 week. Procedure Code(s):    --- Professional ---                       [phone], Colonoscopy, flexible; with removal of tumor(s),                        polyp(s), or other lesion(s) by snare technique                       [id number], 61, Colonoscopy, flexible; with biopsy, single                        or multiple Diagnosis Code(s):    --- Professional ---                       Z86.010, Personal history of colonic polyps                       K64.0, First degree hemorrhoids                       D12.4, Benign neoplasm of descending colon                       D12.5, Benign neoplasm of sigmoid colon                       K62.1, Rectal polyp                       Z83.71, Family history of colonic polyps                       K57.30, Diverticulosis of large intestine without  perforation or abscess without bleeding CPT copyright 2016 American Medical Association. All rights reserved. The codes documented in this report are preliminary and upon coder review may  be revised to meet current compliance requirements. Lollie Sails, MD 12/08/2016 12:08:27 PM This report has been signed electronically. Number of Addenda: 0 Note Initiated On: 12/08/2016 10:52 AM Scope Withdrawal Time: 0 hours 21 minutes 9 seconds  Total Procedure Duration: 0 hours 40 minutes 14 seconds       [facility]

## 2016-12-08 NOTE — H&P (Signed)
Outpatient short stay form Pre-procedure 12/08/2016 10:57 AM Emily Sails MD  Primary Physician: Dr. Reginia Forts  Reason for visit:  Colonoscopy  History of present illness:  Patient is a 56 year old female presenting today as above. There is a family history of colon polyps in primary relatives. On previous colonoscopies patient has had polyps however the been hyperplastic. She had some nausea and emesis with her prep however states that her stools clear this morning.. She takes no aspirin or blood thinning agent.    Current Facility-Administered Medications:  .  0.9 %  sodium chloride infusion, , Intravenous, Continuous, Emily Sails, MD, Last Rate: 20 mL/hr at 12/08/16 1046 .  0.9 %  sodium chloride infusion, , Intravenous, Continuous, Emily Sails, MD  Prescriptions Prior to Admission  Medication Sig Dispense Refill Last Dose  . Acidophilus Lactobacillus CAPS Take by mouth daily.   Past Month at Unknown time  . amLODipine (NORVASC) 10 MG tablet Take 1 tablet (10 mg total) by mouth daily. 90 tablet 3 12/08/2016 at Unknown time  . cetirizine (ZYRTEC) 10 MG tablet Take 10 mg by mouth daily.     12/07/2016 at Unknown time  . clonazePAM (KLONOPIN) 0.5 MG tablet take 1-2 tablets by mouth at bedtime for sleep 60 tablet 5 Past Week at Unknown time  . Dexlansoprazole (DEXILANT) 30 MG capsule Take 30 mg by mouth daily.   12/07/2016 at Unknown time  . hydrochlorothiazide (HYDRODIURIL) 12.5 MG tablet Take 1 tablet (12.5 mg total) by mouth daily. 90 tablet 3 Past Week at Unknown time  . losartan (COZAAR) 100 MG tablet Take 1 tablet (100 mg total) by mouth daily. 90 tablet 3 12/07/2016 at Unknown time  . montelukast (SINGULAIR) 10 MG tablet Take 1 tablet (10 mg total) by mouth at bedtime. 90 tablet 3 12/07/2016 at Unknown time  . Omega-3 Fatty Acids (FISH OIL PO) Take 1,200 mg by mouth daily.   Past Month at Unknown time  . propranolol (INDERAL) 20 MG tablet Take 3 tablets (60 mg total)  by mouth 2 (two) times daily. 360 tablet 3 12/08/2016 at Unknown time  . psyllium (HYDROCIL/METAMUCIL) 95 % PACK Take 1 packet by mouth daily as needed for mild constipation.    Past Week at Unknown time  . albuterol (PROVENTIL HFA;VENTOLIN HFA) 108 (90 Base) MCG/ACT inhaler Inhale 2 puffs into the lungs every 6 (six) hours as needed for wheezing or shortness of breath (cough, shortness of breath or wheezing.). (Patient not taking: Reported on 12/08/2016) 1 Inhaler 1 Completed Course at Unknown time  . Cholecalciferol (VITAMIN D3) 2000 UNITS TABS Take 1 tablet by mouth daily.    Not Taking at Unknown time  . HYDROcodone-acetaminophen (NORCO/VICODIN) 5-325 MG per tablet Take 1 tablet by mouth every 6 (six) hours as needed for moderate pain. (Patient not taking: Reported on 11/21/2016) 30 tablet 0 Not Taking at Unknown time  . HYDROcodone-homatropine (HYCODAN) 5-1.5 MG/5ML syrup Take 5 mLs by mouth every 8 (eight) hours as needed. (Patient not taking: Reported on 11/21/2016) 100 mL 0 Not Taking at Unknown time  . HYDROmorphone (DILAUDID) 2 MG tablet Take 2 mg by mouth every 4 (four) hours as needed for severe pain.    Not Taking at Unknown time  . hyoscyamine (ANASPAZ) 0.125 MG TBDP Place 0.125-0.25 mg under the tongue every 6 (six) hours as needed for cramping. For stomach cramps   Not Taking at Unknown time  . Multiple Vitamin (MULTIVITAMIN) tablet Take 1 tablet by mouth  daily.   Not Taking at Unknown time     Allergies  Allergen Reactions  . Duloxetine Hcl Other (See Comments)    Hypertension, mood changes  . Mirtazapine Swelling  . Serotonin Reuptake Inhibitors (Ssris) Other (See Comments)    Hypertension, mood changes  . St Johns Wort Anaphylaxis  . Bupropion Hcl Other (See Comments)    Mental changes and high blood pressure   . Celebrex [Celecoxib] Hypertension  . Effexor [Venlafaxine] Other (See Comments)    Moodiness, vivid dreams, poor sleep cycle.  Doristine Bosworth [Gabapentin (Once-Daily)] Cough   . Lyrica [Pregabalin] Other (See Comments) and Hypertension    Mood changes, eye twitching and UTI  . Methylphenidate Hcl Other (See Comments)    unknown  . Mirapex [Pramipexole]     HTN, hallucinations and nausea  . Modafinil Swelling and Hypertension  . Rabeprazole Sodium Swelling and Other (See Comments)    Headaches   . Savella [Milnacipran Hcl] Other (See Comments)  . Zegerid [Omeprazole] Other (See Comments)  . Lisinopril Rash    Hypertension     Past Medical History:  Diagnosis Date  . Allergic rhinitis, cause unspecified   . Allergy   . Anemia   . Arthritis    C-spine; 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 Barrett's esophagus   . Cervical radiculopathy   . Chicken pox    childhood  . Depression   . Diaphragmatic hernia without mention of obstruction or gangrene   . Fibromyalgia   . Fibromyalgia   . GERD (gastroesophageal reflux disease)   . HTN (hypertension)   . Insomnia, unspecified   . Migraine, unspecified, without mention of intractable migraine without mention of status migrainosus   . Narcolepsy    w/o cataplexy  . Nonspecific abnormal electrocardiogram (ECG) (EKG)   . Osteoporosis   . Other abnormal glucose   . Periodic limb movement disorder   . Personal history of colonic polyps   . Postlaminectomy syndrome of cervical region   . Tobacco use disorder   . Unspecified vitamin D deficiency     Review of systems:      Physical Exam    Heart and lungs: Regular rate and rhythm without rub or gallop, lungs are bilaterally clear.    HEENT: Normocephalic atraumatic eyes are anicteric    Other:     Pertinant exam for procedure: Soft nontender nondistended bowel sounds positive normoactive.    Planned proceedures: Colonoscopy and indicated procedures. I have discussed the risks benefits and complications of procedures to include not limited to bleeding, infection, perforation and the risk of sedation and the patient  wishes to proceed.    Emily Sails, MD Gastroenterology 12/08/2016  10:57 AM

## 2016-12-08 NOTE — Anesthesia Postprocedure Evaluation (Signed)
Anesthesia Post Note  Patient: Emily Melton  Procedure(s) Performed: Procedure(s) (LRB): COLONOSCOPY WITH PROPOFOL (N/A)  Patient location during evaluation: Endoscopy Anesthesia Type: General Level of consciousness: awake and alert and oriented Pain management: pain level controlled Vital Signs Assessment: post-procedure vital signs reviewed and stable Respiratory status: spontaneous breathing, nonlabored ventilation and respiratory function stable Cardiovascular status: blood pressure returned to baseline and stable Postop Assessment: no signs of nausea or vomiting Anesthetic complications: no     Last Vitals:  Vitals:   12/08/16 1220 12/08/16 1230  BP: (!) 148/88 (!) 163/83  Pulse: 69 60  Resp: 16 19  Temp:      Last Pain:  Vitals:   12/08/16 1210  TempSrc: Tympanic  PainSc:                  [place]

## 2016-12-08 NOTE — Transfer of Care (Signed)
Immediate Anesthesia Transfer of Care Note  Patient: Emily Melton  Procedure(s) Performed: Procedure(s): COLONOSCOPY WITH PROPOFOL (N/A)  Patient Location: PACU  Anesthesia Type:General  Level of Consciousness: awake  Airway & Oxygen Therapy: Patient Spontanous Breathing and Patient connected to nasal cannula oxygen  Post-op Assessment: Report given to RN and Post -op Vital signs reviewed and stable  Post vital signs: Reviewed and stable  Last Vitals:  Vitals:   12/08/16 1028 12/08/16 1210  BP: (!) 154/74 (!) 161/87  Pulse: 70 69  Resp: 16 15  Temp: 36.1 C (!) 36 C    Last Pain:  Vitals:   12/08/16 1210  TempSrc: Tympanic  PainSc:          Complications: No apparent anesthesia complications

## 2016-12-12 ENCOUNTER — Encounter: Payer: Self-pay | Admitting: Gastroenterology

## 2016-12-12 LAB — SURGICAL PATHOLOGY

## 2016-12-18 ENCOUNTER — Telehealth: Payer: Self-pay | Admitting: Family Medicine

## 2016-12-18 NOTE — Telephone Encounter (Signed)
Pt is needing to talk with dr Tamala Julian about her lab results and what new medication her ortho dr thru physical therapy   [phone]

## 2016-12-21 NOTE — Telephone Encounter (Signed)
Please see lab 11/21/16 and advise

## 2016-12-27 ENCOUNTER — Telehealth: Payer: Self-pay | Admitting: Family Medicine

## 2016-12-27 NOTE — Telephone Encounter (Signed)
Pt is needing to talk with someone about her lab results   States that she sees the results on MyChart but does not understand the results  Best number [phone]

## 2016-12-28 NOTE — Telephone Encounter (Signed)
I called pt and let her know as soon as Dr Tamala Julian signs off on them we will call her back and she verbalized understanding

## 2017-01-04 ENCOUNTER — Telehealth: Payer: Self-pay | Admitting: Emergency Medicine

## 2017-01-04 NOTE — Telephone Encounter (Signed)
Dr. Tamala Julian, Patient is requesting details of lab results from 11/21/16 office visit.  She only wants to hear from you.   Please attach note to My Chart results

## 2017-01-08 ENCOUNTER — Encounter: Payer: Self-pay | Admitting: Family Medicine

## 2017-01-08 NOTE — Telephone Encounter (Signed)
Left message on voicemail; also left message via MyChart in Lab Results.

## 2017-01-08 NOTE — Telephone Encounter (Signed)
Left message on patient's voicemail; also sent Mychart message with lab results and recommendations.

## 2017-01-08 NOTE — Telephone Encounter (Signed)
Left message on voicemail and MyChart.

## 2017-01-08 NOTE — Telephone Encounter (Signed)
Left message on patient's voicemail.  Also sent MyChart message with recommendations on labs.

## 2017-02-07 ENCOUNTER — Encounter: Payer: Self-pay | Admitting: Family Medicine

## 2017-02-07 ENCOUNTER — Ambulatory Visit (INDEPENDENT_AMBULATORY_CARE_PROVIDER_SITE_OTHER): Payer: 59 | Admitting: Family Medicine

## 2017-02-07 DIAGNOSIS — I10 Essential (primary) hypertension: Secondary | ICD-10-CM

## 2017-02-07 DIAGNOSIS — W57XXXA Bitten or stung by nonvenomous insect and other nonvenomous arthropods, initial encounter: Secondary | ICD-10-CM | POA: Diagnosis not present

## 2017-02-07 DIAGNOSIS — T22231A Burn of second degree of right upper arm, initial encounter: Secondary | ICD-10-CM | POA: Diagnosis not present

## 2017-02-07 DIAGNOSIS — S40862A Insect bite (nonvenomous) of left upper arm, initial encounter: Secondary | ICD-10-CM | POA: Diagnosis not present
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
## 2017-02-07 NOTE — Patient Instructions (Signed)
     IF you received an x-ray today, you will receive an invoice from [facility]. Please contact [facility] at [phone] with questions or concerns regarding your invoice.   IF you received labwork today, you will receive an invoice from [facility]. Please contact [facility] at [phone] with questions or concerns regarding your invoice.   Our billing staff will not be able to assist you with questions regarding bills from these companies.  You will be contacted with the lab results as soon as they are available. The fastest way to get your results is to activate your My Chart account. Instructions are located on the last page of this paperwork. If you have not heard from us regarding the results in 2 weeks, please contact this office.     

## 2017-02-11 LAB — COMPREHENSIVE METABOLIC PANEL
ALT: 33 IU/L — ABNORMAL HIGH (ref 0–32)
AST: 28 IU/L (ref 0–40)
Albumin/Globulin Ratio: 2.1 (ref 1.2–2.2)
Albumin: 4.8 g/dL (ref 3.5–5.5)
Alkaline Phosphatase: 112 IU/L (ref 39–117)
BUN/Creatinine Ratio: 23 (ref 9–23)
BUN: 14 mg/dL (ref 6–24)
Bilirubin Total: 0.5 mg/dL (ref 0.0–1.2)
CO2: 22 mmol/L (ref 20–29)
Calcium: 11.2 mg/dL — ABNORMAL HIGH (ref 8.7–10.2)
Chloride: 103 mmol/L (ref 96–106)
Creatinine, Ser: 0.6 mg/dL (ref 0.57–1.00)
GFR calc Af Amer: 119 mL/min/{1.73_m2} (ref >59)
GFR calc non Af Amer: 103 mL/min/{1.73_m2} (ref >59)
Globulin, Total: 2.3 g/dL (ref 1.5–4.5)
Glucose: 101 mg/dL — ABNORMAL HIGH (ref 65–99)
Potassium: 4.5 mmol/L (ref 3.5–5.2)
Sodium: 142 mmol/L (ref 134–144)
Total Protein: 7.1 g/dL (ref 6.0–8.5)

## 2017-02-11 LAB — PROTEIN ELECTROPHORESIS, SERUM
A/G Ratio: 1.4 (ref 0.7–1.7)
Albumin ELP: 4.1 g/dL (ref 2.9–4.4)
Alpha 1: 0.3 g/dL (ref 0.0–0.4)
Alpha 2: 0.6 g/dL (ref 0.4–1.0)
Beta: 1.2 g/dL (ref 0.7–1.3)
Gamma Globulin: 0.9 g/dL (ref 0.4–1.8)
Globulin, Total: 3 g/dL (ref 2.2–3.9)

## 2017-02-11 LAB — PTH-RELATED PEPTIDE: PTH-related peptide: <2 pmol/L

## 2017-02-11 LAB — CALCIUM, IONIZED: Calcium, Ion: 6 mg/dL — ABNORMAL HIGH (ref 4.5–5.6)

## 2017-02-11 LAB — PARATHYROID HORMONE, INTACT (NO CA): PTH: 88 pg/mL — ABNORMAL HIGH (ref 15–65)

## 2017-02-20 ENCOUNTER — Encounter: Payer: Self-pay | Admitting: Family Medicine

## 2017-02-28 ENCOUNTER — Telehealth: Payer: Self-pay | Admitting: Family Medicine

## 2017-02-28 DIAGNOSIS — E210 Primary hyperparathyroidism: Secondary | ICD-10-CM

## 2017-02-28 NOTE — Telephone Encounter (Signed)
Pt is wanting to talk with Dr. Tamala Julian to get her lab results from 02/07/17   Best number [phone]

## 2017-03-07 ENCOUNTER — Encounter: Payer: Self-pay | Admitting: Family Medicine

## 2017-03-12 NOTE — Telephone Encounter (Signed)
Please release lab results

## 2017-03-13 ENCOUNTER — Telehealth: Payer: Self-pay | Admitting: Internal Medicine

## 2017-03-13 NOTE — Telephone Encounter (Signed)
Pt would like to stop Clonazepam-side effects are as follows: mental irritability, increased need to sleep. Pt would like to know if she can stop "cold [place]" or should she taper down.

## 2017-03-13 NOTE — Telephone Encounter (Signed)
Taper- Cut dose in half for a week before stopping

## 2017-03-13 NOTE — Telephone Encounter (Signed)
Patient is returning phone call.  °

## 2017-03-13 NOTE — Telephone Encounter (Signed)
PATIENT HAS SENT A MY-CHART MESSAGE TO DR. Tamala Julian SO NOW SHE IS CALLING TO SEE IF SHE CAN GET HER LAB RESULTS FROM WHEN SHE WAS IN THE OFFICE ON 02/07/17. SHE CAN PULL THE RESULTS UP ON MY-CHART BUT SHE NEEDS DR. Tamala Julian TO ADVISE HER ON WHAT SHE SHOULD DO NEXT? BEST PHONE [phone] (CELL) PHARMACY CHOICE IF NEEDED IS [facility] [place] [facility] [address]. [place]

## 2017-03-13 NOTE — Telephone Encounter (Signed)
Left message for patient to call back  

## 2017-03-13 NOTE — Telephone Encounter (Signed)
Pt is aware of recs from CY and nothing more needed at this time.

## 2017-03-15 ENCOUNTER — Telehealth: Payer: Self-pay | Admitting: Family Medicine

## 2017-03-16 NOTE — Telephone Encounter (Signed)
I gave patient all normal lab results. She only has a question on her PTH results. Do you want her to see specialist? Please advise

## 2017-03-17 NOTE — Telephone Encounter (Signed)
See My chart message

## 2017-03-17 NOTE — Telephone Encounter (Signed)
Mychart message sent to patient.

## 2017-03-21 ENCOUNTER — Ambulatory Visit (INDEPENDENT_AMBULATORY_CARE_PROVIDER_SITE_OTHER): Payer: Medicare Other | Admitting: Family Medicine

## 2017-03-21 DIAGNOSIS — E210 Primary hyperparathyroidism: Secondary | ICD-10-CM

## 2017-03-22 LAB — PHOSPHORUS: Phosphorus: 3 mg/dL (ref 2.5–4.5)

## 2017-03-22 LAB — VITAMIN D 25 HYDROXY (VIT D DEFICIENCY, FRACTURES): Vit D, 25-Hydroxy: 26.5 ng/mL — ABNORMAL LOW (ref 30.0–100.0)

## 2017-03-24 LAB — CALCIUM, URINE, 24 HOUR
Calcium, 24H Urine: 459.8 mg/24 hr — ABNORMAL HIGH (ref 100.0–300.0)
Calcium, Urine: 24.2 mg/dL

## 2017-03-25 NOTE — Progress Notes (Signed)
Lab visit only; no provider encounter. 

## 2017-03-27 ENCOUNTER — Ambulatory Visit: Payer: Medicare Other | Admitting: Family Medicine

## 2017-04-04 ENCOUNTER — Telehealth: Payer: Self-pay | Admitting: Family Medicine

## 2017-04-04 NOTE — Telephone Encounter (Signed)
PATIENT WOULD LIKE DR. Tamala Julian TO KNOW THAT SHE WANTS TO SPEAK WITH HER REGARDING THE LABS THAT WERE DONE ON 03/21/17. SHE CAN SEE THEM IN MY-CHART, BUT SHE WANTS TO TALK WITH DR. Tamala Julian ABOUT THEM. SHE WAS ALSO ASKING ABOUT HER REFERRALS AND I TRANSFERRED HER TO ANNIE AT [phone] TO LEAVE HER A MESSAGE. BEST PHONE [phone] (CELL) PHARMACY CHOICE IF NEEDED IS [facility] [place]

## 2017-04-05 ENCOUNTER — Telehealth: Payer: Self-pay | Admitting: Family Medicine

## 2017-04-05 NOTE — Telephone Encounter (Signed)
Pt needs a new order placed for her Bone Density. She originally scheduled it at [facility] but they could not do it until the end of October and she wanted to try to get this a little sooner. She called [facility] and they said they could do it the week of 04/23/17. I called [facility] to see if this could be rescheduled with them but they said a new order would need to be put in since this one was already scheduled. Pt also wants to know if it's okay to get a flu shot due to her health conditions. Pt is going to see pulmonologist on 04/20/17. Please advise. Thanks!

## 2017-04-06 ENCOUNTER — Telehealth: Payer: Self-pay | Admitting: Family Medicine

## 2017-04-06 NOTE — Telephone Encounter (Signed)
[facility] radiology called stating that pt has an appt scheduled for 9/25 but they need the diagnosed code change to match what she is getting done. It stated she is getting a US renal but the diagnosis says its hyperparathyroidism.. They stated they needed that changed to match US renal.. They stated that thry can keep pt appt set for 9/25.. Any questions contact [facility] at [phone]

## 2017-04-09 NOTE — Telephone Encounter (Signed)
Spoke with radiology they will change order when patient comes in to make it match

## 2017-04-10 ENCOUNTER — Other Ambulatory Visit: Payer: Self-pay | Admitting: *Deleted

## 2017-04-10 ENCOUNTER — Ambulatory Visit
Admission: RE | Admit: 2017-04-10 | Discharge: 2017-04-10 | Disposition: A | Discharge status: Home / Self Care | Payer: 59 | Source: Ambulatory Visit | Attending: Family Medicine | Admitting: Family Medicine

## 2017-04-10 DIAGNOSIS — E210 Primary hyperparathyroidism: Secondary | ICD-10-CM | POA: Insufficient documentation

## 2017-04-10 NOTE — Telephone Encounter (Signed)
Ordered placed by Davina today.  Reordered.

## 2017-04-10 NOTE — Telephone Encounter (Signed)
Informed patient Bone density has been ordered.   She can come in for a visit or E visit for urinary symptoms.    Dr. Tamala Julian is aware of her lab results

## 2017-04-10 NOTE — Telephone Encounter (Signed)
Clarification on what questions about labs so it can be forwarded to Dr. Tamala Julian

## 2017-04-11 ENCOUNTER — Other Ambulatory Visit: Payer: Self-pay | Admitting: Family Medicine

## 2017-04-11 DIAGNOSIS — E2839 Other primary ovarian failure: Secondary | ICD-10-CM

## 2017-04-11 DIAGNOSIS — E210 Primary hyperparathyroidism: Secondary | ICD-10-CM

## 2017-04-12 ENCOUNTER — Other Ambulatory Visit: Payer: Self-pay | Admitting: Family Medicine

## 2017-04-12 DIAGNOSIS — E213 Hyperparathyroidism, unspecified: Secondary | ICD-10-CM

## 2017-04-20 ENCOUNTER — Ambulatory Visit (INDEPENDENT_AMBULATORY_CARE_PROVIDER_SITE_OTHER)
Admission: RE | Admit: 2017-04-20 | Discharge: 2017-04-20 | Disposition: A | Discharge status: Home / Self Care | Payer: 59 | Source: Ambulatory Visit | Attending: Internal Medicine | Admitting: Internal Medicine

## 2017-04-20 ENCOUNTER — Encounter: Payer: Self-pay | Admitting: Internal Medicine

## 2017-04-20 ENCOUNTER — Ambulatory Visit (INDEPENDENT_AMBULATORY_CARE_PROVIDER_SITE_OTHER): Payer: 59 | Admitting: Internal Medicine

## 2017-04-20 VITALS — BP 128/76 | HR 53 | Ht 62.0 in | Wt 209.0 lb

## 2017-04-20 DIAGNOSIS — J449 Chronic obstructive pulmonary disease, unspecified: Secondary | ICD-10-CM | POA: Diagnosis not present

## 2017-04-20 DIAGNOSIS — Z23 Encounter for immunization: Secondary | ICD-10-CM

## 2017-04-20 DIAGNOSIS — F5101 Primary insomnia: Secondary | ICD-10-CM

## 2017-04-20 NOTE — Progress Notes (Signed)
HPI  female former smoker followed for nocturnal hypoxia/minimal OSA, history Narcolepsy Sleep Study 2016 - Dr Rexene Alberts. History of Narcolepsy - diagnosed 2001- tried and failed Ritalin and Provigil. NPSG  11/30/14- [facility] AHI 5.3/ hr, desat to 83%, weight 216 lbs Study in 2001 in [place], [place], with report no longer available, included MSLT and she says diagnosed narcolepsy without sleep apnea. The 2016 sleep study was supposed to have included and MST but that didn't get done and she didn't like her experience there. Takes Zanaflex for cervical radiculopathy pain and it helps her sleep .Previous trials of Provigil and Ritalin both said to cause HBP and "swelling". She ended up on Dexedrine for a while. Tried Mirapex for vaguely defined limb jerks. This caused a lot of malaise and nausea. NPSG 10/19/2015-AHI 2.4 per hour/WNL, desaturation to 81%, moderate snoring, severe PLMS with arousal 6.3/hour MSLT- 10/20/2015-mean latency 3 minutes 43 seconds, 0/5 SOREM    C/W idiopathic hypersomnia History of recurrent bronchitis and chronic cough until she quit smoking. Several previous pneumonias .PFT 12/08/2015-moderate obstructive airways disease, insignificant response to bronchodilator, normal total lung capacity. They could not get an accurate diffusion capacity ------------------------------------------------------------------------------------------------------  04/20/2016-56 year old female former smoker followed COPD for nocturnal hypoxia/minimal OSA, history narcolepsy FOLLOWS FOR: Pt states she needs refill for Clonazepam, questions use of ETOH and opioids with sleep meds. Pt is sleeping through the night but wakes tired and stays tired through the day.  Best dose prob clonazepam 0.75 mg. Discussed med interactions. Preferred hs 11-12 PM, up 8AM. Had chest cold from grand kids Rx'd ceftin. Cough kept her awake but didn't want to overlap codeine cough syrup w  clonazepam.  04/20/17- 56 year old female former smoker followed COPD for nocturnal hypoxia/minimal OSA, history narcolepsy Follows for: DOE and Hypersomnolence;  Stopped clonazepam saying it made her too sleepy. Got new adjustable bed and sleeps in a room separate from her husband-sleeping better. Husband tells her she is not coughing or snoring now. Has no inhalers. Singulair is sufficient. Has zyrtec for allergic rhinitis. CXR 07/18/16 IMPRESSION: Chronic bronchitic changes. No pneumonia, CHF, nor other acute cardiopulmonary abnormality.  ROS-see HPI  + = positive Constitutional:    weight loss, night sweats, fevers, chills, + fatigue, lassitude. HEENT:    headaches, difficulty swallowing, tooth/dental problems, sore throat,       sneezing, itching, ear ache, nasal congestion, post nasal drip, snoring CV:    chest pain, orthopnea, PND, swelling in lower extremities, anasarca,                                                     dizziness, palpitations Resp:   shortness of breath with exertion or at rest.                productive cough,   non-productive cough, coughing up of blood.              change in color of mucus.  wheezing.   Skin:    rash or lesions. GI:  No-   heartburn, indigestion, abdominal pain, nausea, vomiting, diarrhea,                 change in bowel habits, loss of appetite GU: dysuria, change in color of urine, no urgency or frequency.   flank pain. MS:   joint pain, stiffness, decreased  range of motion, + back pain. Neuro-     nothing unusual Psych:  change in mood or affect.  depression or anxiety.   memory loss.  OBJ- Physical Exam General- Alert, Oriented, Affect-appropriate, Distress- none acute, + overweight with thick neck Skin- rash-none, lesions- none, excoriation- none Lymphadenopathy- none Head- atraumatic            Eyes- Gross vision intact, PERRLA, conjunctivae and secretions clear            Ears- Hearing, canals-normal            Nose- Clear,  no-Septal dev, mucus, polyps, erosion, perforation             Throat- Mallampati III , mucosa clear , drainage- none, tonsils- atrophic Neck- flexible , trachea midline, no stridor , thyroid nl, carotid no bruit Chest - symmetrical excursion , unlabored           Heart/CV- RRR , no murmur , no gallop  , no rub, nl s1 s2                           - JVD- none , edema- none, stasis changes- none, varices- none           Lung- clear to P&A, wheeze- none, cough- none , dullness-none, rub- none           Chest wall-  Abd-  Br/ Gen/ Rectal- Not done, not indicated Extrem- cyanosis- none, clubbing, none, atrophy- none, strength- nl Neuro- grossly intact to observation

## 2017-04-20 NOTE — Patient Instructions (Addendum)
Order- CXR- COPD mixed type  Flu vax standard  Please call as needed

## 2017-04-22 NOTE — Assessment & Plan Note (Signed)
Using only Singulair with no inhalers and no routine cough or wheeze. Had quit smoking in 2015. 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 Plan-consider further to do follow-up spirometry. Flu vaccine today. CXR

## 2017-04-22 NOTE — Assessment & Plan Note (Signed)
Sleeping better in her own room with adjustable bed. Able to drop off the clonazepam.

## 2017-04-24 ENCOUNTER — Ambulatory Visit
Admission: RE | Admit: 2017-04-24 | Discharge: 2017-04-24 | Disposition: A | Discharge status: Home / Self Care | Payer: 59 | Source: Ambulatory Visit | Attending: Family Medicine | Admitting: Family Medicine

## 2017-04-24 DIAGNOSIS — E2839 Other primary ovarian failure: Secondary | ICD-10-CM

## 2017-04-24 DIAGNOSIS — E210 Primary hyperparathyroidism: Secondary | ICD-10-CM

## 2017-04-28 ENCOUNTER — Encounter: Payer: Self-pay | Admitting: Family Medicine

## 2017-05-01 ENCOUNTER — Other Ambulatory Visit: Payer: Self-pay

## 2017-05-14 ENCOUNTER — Other Ambulatory Visit: Payer: Self-pay

## 2017-05-21 ENCOUNTER — Other Ambulatory Visit: Payer: Self-pay | Admitting: Internal Medicine

## 2017-05-25 ENCOUNTER — Telehealth: Payer: Self-pay

## 2017-05-25 DIAGNOSIS — E7800 Pure hypercholesterolemia, unspecified: Secondary | ICD-10-CM

## 2017-05-25 DIAGNOSIS — E559 Vitamin D deficiency, unspecified: Secondary | ICD-10-CM

## 2017-05-25 DIAGNOSIS — I10 Essential (primary) hypertension: Secondary | ICD-10-CM

## 2017-05-25 DIAGNOSIS — E213 Hyperparathyroidism, unspecified: Secondary | ICD-10-CM

## 2017-05-25 DIAGNOSIS — R7302 Impaired glucose tolerance (oral): Secondary | ICD-10-CM

## 2017-05-25 NOTE — Telephone Encounter (Signed)
Patient did not do her AWV today because she has [facility] private insurance and Medicare is secondary. She does not want to receive a bill. She wants to know if you will order her labs before her appointment with you on 06/04/17? She would like to have the results when she comes in for her visit.

## 2017-05-25 NOTE — Progress Notes (Signed)
This encounter was created in error - please disregard.

## 2017-05-30 ENCOUNTER — Ambulatory Visit (INDEPENDENT_AMBULATORY_CARE_PROVIDER_SITE_OTHER): Payer: 59 | Admitting: Family Medicine

## 2017-05-30 DIAGNOSIS — E213 Hyperparathyroidism, unspecified: Secondary | ICD-10-CM

## 2017-05-30 DIAGNOSIS — R7302 Impaired glucose tolerance (oral): Secondary | ICD-10-CM

## 2017-05-30 DIAGNOSIS — I10 Essential (primary) hypertension: Secondary | ICD-10-CM

## 2017-05-30 DIAGNOSIS — E7800 Pure hypercholesterolemia, unspecified: Secondary | ICD-10-CM

## 2017-05-30 NOTE — Progress Notes (Signed)
Lab visit only.

## 2017-05-31 LAB — COMPREHENSIVE METABOLIC PANEL
ALT: 33 IU/L — ABNORMAL HIGH (ref 0–32)
AST: 32 IU/L (ref 0–40)
Albumin/Globulin Ratio: 2.1 (ref 1.2–2.2)
Albumin: 4.8 g/dL (ref 3.5–5.5)
Alkaline Phosphatase: 129 IU/L — ABNORMAL HIGH (ref 39–117)
BUN/Creatinine Ratio: 22 (ref 9–23)
BUN: 15 mg/dL (ref 6–24)
Bilirubin Total: 0.5 mg/dL (ref 0.0–1.2)
CO2: 24 mmol/L (ref 20–29)
Calcium: 11.6 mg/dL — ABNORMAL HIGH (ref 8.7–10.2)
Chloride: 105 mmol/L (ref 96–106)
Creatinine, Ser: 0.68 mg/dL (ref 0.57–1.00)
GFR calc Af Amer: 114 mL/min/{1.73_m2} (ref >59)
GFR calc non Af Amer: 99 mL/min/{1.73_m2} (ref >59)
Globulin, Total: 2.3 g/dL (ref 1.5–4.5)
Glucose: 106 mg/dL — ABNORMAL HIGH (ref 65–99)
Potassium: 4.7 mmol/L (ref 3.5–5.2)
Sodium: 143 mmol/L (ref 134–144)
Total Protein: 7.1 g/dL (ref 6.0–8.5)

## 2017-05-31 LAB — LIPID PANEL
Chol/HDL Ratio: 4.2 ratio (ref 0.0–4.4)
Cholesterol, Total: 216 mg/dL — ABNORMAL HIGH (ref 100–199)
HDL: 51 mg/dL (ref >39)
LDL Calculated: 140 mg/dL — ABNORMAL HIGH (ref 0–99)
Triglycerides: 126 mg/dL (ref 0–149)
VLDL Cholesterol Cal: 25 mg/dL (ref 5–40)

## 2017-05-31 LAB — IMMATURE CELLS: Metamyelocytes: 1 % — ABNORMAL HIGH (ref 0–0)

## 2017-05-31 LAB — TSH: TSH: 2.46 u[IU]/mL (ref 0.450–4.500)

## 2017-05-31 LAB — CBC WITH DIFFERENTIAL/PLATELET
Basophils Absolute: 0.1 10*3/uL (ref 0.0–0.2)
Basos: 2 %
EOS (ABSOLUTE): 0.1 10*3/uL (ref 0.0–0.4)
Eos: 2 %
Hematocrit: 40.3 % (ref 34.0–46.6)
Hemoglobin: 13.6 g/dL (ref 11.1–15.9)
Lymphocytes Absolute: 2.7 10*3/uL (ref 0.7–3.1)
Lymphs: 48 %
MCH: 29.2 pg (ref 26.6–33.0)
MCHC: 33.7 g/dL (ref 31.5–35.7)
MCV: 87 fL (ref 79–97)
Monocytes Absolute: 0.2 10*3/uL (ref 0.1–0.9)
Monocytes: 4 %
Neutrophils Absolute: 2.4 10*3/uL (ref 1.4–7.0)
Neutrophils: 43 %
Platelets: 187 10*3/uL (ref 150–379)
RBC: 4.65 x10E6/uL (ref 3.77–5.28)
RDW: 14 % (ref 12.3–15.4)
WBC: 5.6 10*3/uL (ref 3.4–10.8)

## 2017-05-31 LAB — PARATHYROID HORMONE, INTACT (NO CA): PTH: 87 pg/mL — ABNORMAL HIGH (ref 15–65)

## 2017-05-31 LAB — HEMOGLOBIN A1C
Est. average glucose Bld gHb Est-mCnc: 108 mg/dL
Hgb A1c MFr Bld: 5.4 % (ref 4.8–5.6)

## 2017-06-04 ENCOUNTER — Ambulatory Visit (INDEPENDENT_AMBULATORY_CARE_PROVIDER_SITE_OTHER): Payer: 59 | Admitting: Family Medicine

## 2017-06-04 ENCOUNTER — Encounter: Payer: Self-pay | Admitting: Family Medicine

## 2017-06-04 ENCOUNTER — Other Ambulatory Visit: Payer: Self-pay

## 2017-06-04 VITALS — BP 132/72 | HR 62 | Temp 97.8°F | Resp 16 | Ht 61.81 in | Wt 211.0 lb

## 2017-06-04 DIAGNOSIS — R300 Dysuria: Secondary | ICD-10-CM | POA: Diagnosis not present

## 2017-06-04 DIAGNOSIS — E7800 Pure hypercholesterolemia, unspecified: Secondary | ICD-10-CM | POA: Diagnosis not present

## 2017-06-04 DIAGNOSIS — I10 Essential (primary) hypertension: Secondary | ICD-10-CM

## 2017-06-04 DIAGNOSIS — Z0000 Encounter for general adult medical examination without abnormal findings: Secondary | ICD-10-CM

## 2017-06-04 DIAGNOSIS — E213 Hyperparathyroidism, unspecified: Secondary | ICD-10-CM | POA: Diagnosis not present

## 2017-06-04 DIAGNOSIS — M5030 Other cervical disc degeneration, unspecified cervical region: Secondary | ICD-10-CM

## 2017-06-04 DIAGNOSIS — R7302 Impaired glucose tolerance (oral): Secondary | ICD-10-CM

## 2017-06-04 DIAGNOSIS — F5101 Primary insomnia: Secondary | ICD-10-CM | POA: Diagnosis not present

## 2017-06-04 DIAGNOSIS — G4761 Periodic limb movement disorder: Secondary | ICD-10-CM | POA: Diagnosis not present

## 2017-06-04 LAB — POCT URINALYSIS DIP (MANUAL ENTRY)
Bilirubin, UA: NEGATIVE
Blood, UA: NEGATIVE
Glucose, UA: NEGATIVE mg/dL
Ketones, POC UA: NEGATIVE mg/dL
Leukocytes, UA: NEGATIVE
Nitrite, UA: NEGATIVE
Protein Ur, POC: NEGATIVE mg/dL
Spec Grav, UA: 1.025 (ref 1.010–1.025)
Urobilinogen, UA: 0.2 E.U./dL
pH, UA: 5.5 (ref 5.0–8.0)

## 2017-06-04 MED ORDER — AMOXICILLIN-POT CLAVULANATE 875-125 MG PO TABS: 1.0000 | ORAL_TABLET | Freq: Two times a day (BID) | ORAL | 0 refills | Status: DC

## 2017-06-04 NOTE — Progress Notes (Signed)
Subjective:    Patient ID: Emily Melton, female    DOB: 12-02-1960, 56 y.o.   MRN: [id number]  06/04/2017  Annual Exam    HPI This 56 y.o. female presents for Routine Physical Examination and follow-up of chronic medical conditions.  Last physical:  04-05-2015 Pap smear: 2010; hysterectomy Mammogram:  10-13-2016 Colonoscopy:  12-08-2016 Bone density:  04/24/2017   Dilated bile duct: s/p consultation by GI; felt due to cholecystectomy and chronic pain medication; no further intervention recommended.  No interested in ERCP because stated not indicated.  Excessive studies.    Tubular adenoma: 11 cm.    Hyperparathyroidism:  Having a lot of bone pain in shins, wrists, hips.  S/p bone density scan; s/p renal US.  No history of compression  Fx.  Has known OA hip.  Shooting shin pain.  Neck and behind ears is fired up and fighting stuff off.  Like mono or strep throat.  No horrible fatigue.  No severe sore throat; raw annoying throat.  With PND.  Sinuses feel swollen but can breathe.  Awareness behind nose.  Something aggravated in there.  Not sure if cervical spine fusion.  Having metal issues.      Allergic rhinitis: s/p allergy testing five years ago.    Dysuria: with urinary frequency.    HTN: Patient reports good compliance with medication, good tolerance to medication, and good symptom control.    BP Readings from Last 3 Encounters:  06/04/17 132/72  04/20/17 128/76  02/07/17 140/82   Wt Readings from Last 3 Encounters:  06/04/17 211 lb (95.7 kg)  04/20/17 209 lb (94.8 kg)  02/07/17 213 lb (96.6 kg)   Immunization History  Administered Date(s) Administered  . Influenza Split 06/21/2011  . Influenza,inj,Quad PF,6+ Mos 04/30/2013, 05/22/2014, 04/05/2015, 04/20/2016, 04/20/2017  . Influenza-Unspecified 04/05/2015  . Pneumococcal Polysaccharide-23 04/26/2010, 07/18/2011  . Tdap 08/01/2010    Review of Systems  Constitutional: Positive for activity change,  diaphoresis and fatigue. Negative for appetite change, chills, fever and unexpected weight change.  HENT: Positive for sore throat. Negative for congestion, dental problem, drooling, ear discharge, ear pain, facial swelling, hearing loss, mouth sores, nosebleeds, postnasal drip, rhinorrhea, sinus pressure, sneezing, tinnitus, trouble swallowing and voice change.   Eyes: Negative for photophobia, pain, discharge, redness, itching and visual disturbance.  Respiratory: Negative for apnea, cough, choking, chest tightness, shortness of breath, wheezing and stridor.   Cardiovascular: Positive for palpitations. Negative for chest pain and leg swelling.  Gastrointestinal: Positive for abdominal distention, constipation and nausea. Negative for abdominal pain, anal bleeding, blood in stool, diarrhea, rectal pain and vomiting.  Endocrine: Positive for heat intolerance and polyuria. Negative for cold intolerance, polydipsia and polyphagia.  Genitourinary: Positive for difficulty urinating, dysuria, flank pain and urgency. Negative for decreased urine volume, dyspareunia, enuresis, frequency, genital sores, hematuria, menstrual problem, pelvic pain, vaginal bleeding, vaginal discharge and vaginal pain.  Musculoskeletal: Positive for arthralgias, back pain, myalgias and neck pain. Negative for gait problem, joint swelling and neck stiffness.  Skin: Negative for color change, pallor, rash and wound.  Allergic/Immunologic: Positive for environmental allergies. Negative for food allergies and immunocompromised state.  Neurological: Positive for dizziness and weakness. Negative for tremors, seizures, syncope, facial asymmetry, speech difficulty, light-headedness, numbness and headaches.  Hematological: Positive for adenopathy. Does not bruise/bleed easily.  Psychiatric/Behavioral: Positive for decreased concentration and sleep disturbance. Negative for agitation, behavioral problems, confusion, dysphoric mood,  hallucinations, self-injury and suicidal ideas. The patient is not nervous/anxious and is not hyperactive.  Past Medical History:  Diagnosis Date  . Allergic rhinitis, cause unspecified   . Allergy   . Anemia   . Arthritis    C-spine; 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 Barrett's esophagus   . Cervical radiculopathy   . Chicken pox    childhood  . Depression   . Diaphragmatic hernia without mention of obstruction or gangrene   . Fibromyalgia   . Fibromyalgia   . GERD (gastroesophageal reflux disease)   . HTN (hypertension)   . Insomnia, unspecified   . Migraine, unspecified, without mention of intractable migraine without mention of status migrainosus   . Narcolepsy    w/o cataplexy  . Nonspecific abnormal electrocardiogram (ECG) (EKG)   . Osteoporosis   . Other abnormal glucose   . Periodic limb movement disorder   . Personal history of colonic polyps   . Postlaminectomy syndrome of cervical region   . Tobacco use disorder   . Unspecified vitamin D deficiency    Past Surgical History:  Procedure Laterality Date  . ABDOMINAL HYSTERECTOMY     fibroids and endometriosis.  ovarian resection R.  . BACK SURGERY    . BREAST EXCISIONAL BIOPSY  2005    R   Benign  . BREAST SURGERY    . carpal tunnel release - bilateral    . CESAREAN SECTION     x2  1984 and 1986  . CHOLECYSTECTOMY    . 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 COLONOSCOPY  03/17/2012   [facility]  . COLONOSCOPY WITH PROPOFOL N/A 12/08/2016   Procedure: COLONOSCOPY WITH PROPOFOL;  Surgeon: Lollie Sails, MD;  Location: [facility] ENDOSCOPY;  Service: Endoscopy;  Laterality: N/A;  . DIAGNOSTIC LAPAROSCOPY    . DILATION AND CURETTAGE OF UTERUS     SAB  . ESOPHAGOGASTRODUODENOSCOPY  03/17/2012   no Barrett's esophagus. [facility].  . fusion of cervical spine  2007   C5,C6,C7, and T1  . KNEE SURGERY    . laprascopy  2009   for RLQ pain:R oophorectomy performed no  improvement in pain.   . pilonidal cyst resection  1992  . R shoulder surgery  06/24/09   2 tears in rotator cuff,bone spurs. Dr. Modena Morrow  . SHOULDER ARTHROSCOPY Right 2010  . SPINE SURGERY  09/2011   Cervical spine fusion  . TUBAL LIGATION    . tubel ligation  2002  . VAGINAL HYSTERECTOMY  2009   fibroid/endometriosis, one L ovary remaining   Allergies  Allergen Reactions  . Duloxetine Hcl Other (See Comments)    Hypertension, mood changes  . Mirtazapine Swelling  . Serotonin Reuptake Inhibitors (Ssris) Other (See Comments)    Hypertension, mood changes  . St Johns Wort Anaphylaxis  . Bupropion Hcl Other (See Comments)    Mental changes and high blood pressure   . Celebrex [Celecoxib] Hypertension  . Effexor [Venlafaxine] Other (See Comments)    Moodiness, vivid dreams, poor sleep cycle.  Doristine Bosworth [Gabapentin (Once-Daily)] Cough  . Lyrica [Pregabalin] Other (See Comments) and Hypertension    Mood changes, eye twitching and UTI  . Methylphenidate Hcl Other (See Comments)    unknown  . Mirapex [Pramipexole]     HTN, hallucinations and nausea  . Modafinil Swelling and Hypertension  . Rabeprazole Sodium Swelling and Other (See Comments)    Headaches   . Savella [Milnacipran Hcl] Other (See Comments)  . Zegerid [Omeprazole] Other (See Comments)  .  Lisinopril Rash    Hypertension   Current Outpatient Medications on File Prior to Visit  Medication Sig Dispense Refill  . Acidophilus Lactobacillus CAPS Take by mouth daily.    . cetirizine (ZYRTEC) 10 MG tablet Take 10 mg by mouth daily.      Marland Kitchen Dexlansoprazole (DEXILANT) 30 MG capsule Take 30 mg by mouth daily.    Marland Kitchen HYDROcodone-acetaminophen (NORCO/VICODIN) 5-325 MG tablet Take 1 tablet by mouth every 6 (six) hours as needed for moderate pain.    Marland Kitchen HYDROmorphone (DILAUDID) 2 MG tablet Take 2 mg by mouth every 4 (four) hours as needed for severe pain.     . hyoscyamine (ANASPAZ) 0.125 MG TBDP Place 0.125-0.25 mg under the tongue  every 6 (six) hours as needed for cramping. For stomach cramps    . montelukast (SINGULAIR) 10 MG tablet take 1 tablet by mouth at bedtime 30 tablet 12   No current facility-administered medications on file prior to visit.    Social History   Socioeconomic History  . Marital status: Married    Spouse name: Not on file  . Number of children: 2  . Years of education: High Schol  . Highest education level: Not on file  Social Needs  . Financial resource strain: Not on file  . Food insecurity - worry: Not on file  . Food insecurity - inability: Not on file  . Transportation needs - medical: Not on file  . Transportation needs - non-medical: Not on file  Occupational History  . Occupation: Disabled    Employer: Psychologist, forensic  Tobacco Use  . Smoking status: Former Smoker    Packs/day: 1.00    Years: 36.00    Pack years: 36.00    Types: Cigarettes    Last attempt to quit: 11/28/2013    Years since quitting: 3.5  . Smokeless tobacco: Former Systems developer    Quit date: 11/19/2013  . Tobacco comment: smoked 1 ppd 30-35 years; quit in 2011  Substance and Sexual Activity  . Alcohol use: No    Alcohol/week: 0.0 oz    Comment: RARELY  . Drug use: No  . Sexual activity: Yes    Birth control/protection: Surgical  Other Topics Concern  . Not on file  Social History Narrative   Patient exercises 3-4 times per week (walking x 20 minutes 3 days a week).    Marital status:  Married x 11 years; happily married; second marriage; no abuse.      Children:  2 children(31, 29); three grandsons.      Employment:  Long term disability through work; formal disability 12/2015.  Quit working 02/2013.      Tobacco:  1/2 ppd x 34 years.; quit smoking 11/2013.       Alcohol: rare;        Drugs: none      Exercise:  Rare       lives in [place].       Seatbelt:  100%       Guns: none   1-2 cups of coffee a day/ rare occasional Pepsi    Family History  Problem Relation Age of Onset  . Bipolar  disorder Mother   . Cancer Mother        bladder  . Hypertension Mother   . Colon polyps Mother   . Osteoarthritis Mother   . Diabetes Mother   . Thyroid disease Mother   . Heart disease Father 10       CAD/AMI  with stenting/PAD.  Marland Kitchen Hyperlipidemia Father   . Cancer Father        skin  . Angina Father   . Asthma Daughter   . Cancer Maternal Grandmother        colon  . Heart disease Maternal Grandmother   . Cancer Maternal Grandfather        lung  . Parkinson's disease Maternal Grandfather   . Stroke Maternal Grandfather   . Diabetes Maternal Grandfather   . Hyperlipidemia Maternal Grandfather   . Rheum arthritis Paternal Grandmother   . Heart attack Paternal Grandfather   . Pulmonary fibrosis Paternal Aunt   . Pulmonary fibrosis Paternal Uncle        Objective:    BP 132/72   Pulse 62   Temp 97.8 F (36.6 C) (Oral)   Resp 16   Ht 5' 1.81" (1.57 m)   Wt 211 lb (95.7 kg)   SpO2 95%   BMI 38.83 kg/m  Physical Exam  Constitutional: She is oriented to person, place, and time. She appears well-developed and well-nourished. No distress.  HENT:  Head: Normocephalic and atraumatic.  Right Ear: External ear normal.  Left Ear: External ear normal.  Nose: Nose normal.  Mouth/Throat: Oropharynx is clear and moist.  Eyes: Conjunctivae and EOM are normal. Pupils are equal, round, and reactive to light.  Neck: Normal range of motion and full passive range of motion without pain. Neck supple. No JVD present. Carotid bruit is not present. No thyromegaly present.  Cardiovascular: Normal rate, regular rhythm and normal heart sounds. Exam reveals no gallop and no friction rub.  No murmur heard. Pulmonary/Chest: Effort normal and breath sounds normal. She has no wheezes. She has no rales. Right breast exhibits no inverted nipple, no mass, no nipple discharge, no skin change and no tenderness. Left breast exhibits no inverted nipple, no mass, no nipple discharge, no skin change and no  tenderness. Breasts are symmetrical.  Abdominal: Soft. Bowel sounds are normal. She exhibits no distension and no mass. There is no tenderness. There is no rebound and no guarding.  Musculoskeletal:       Right shoulder: Normal.       Left shoulder: Normal.       Cervical back: Normal.  Lymphadenopathy:    She has no cervical adenopathy.  Neurological: She is alert and oriented to person, place, and time. She has normal reflexes. No cranial nerve deficit. She exhibits normal muscle tone. Coordination normal.  Skin: Skin is warm and dry. No rash noted. She is not diaphoretic. No erythema. No pallor.  Psychiatric: She has a normal mood and affect. Her behavior is normal. Judgment and thought content normal.  Nursing note and vitals reviewed.  No results found. Depression screen [facility] 2/9 06/04/2017 02/07/2017 11/21/2016 07/18/2016 05/24/2016  Decreased Interest 0 0 0 0 0  Down, Depressed, Hopeless 0 0 0 0 0  PHQ - 2 Score 0 0 0 0 0  Altered sleeping - - - - -  Tired, decreased energy - - - - -  Change in appetite - - - - -  Feeling bad or failure about yourself  - - - - -  Trouble concentrating - - - - -  Moving slowly or fidgety/restless - - - - -  Suicidal thoughts - - - - -  PHQ-9 Score - - - - -   Fall Risk  06/04/2017 11/21/2016 07/18/2016 05/24/2016 04/24/2016  Falls in the past year? No No No Yes No  Number falls in past yr: - - - 2 or more -        Assessment & Plan:   1. Routine physical examination   2. Dysuria   3. Hyperparathyroidism ([place])   4. Essential hypertension, benign   5. Glucose intolerance (impaired glucose tolerance)   6. DDD (degenerative disc disease), cervical   7. Periodic limb movement sleep disorder   8. Pure hypercholesterolemia   9. Primary insomnia    -anticipatory guidance provided --- exercise, weight loss, safe driving practices, aspirin 24m daily. -obtain age appropriate screening labs and labs for chronic disease management. -moderate fall risk;  no evidence of depression; no evidence of hearing loss.  Discussed advanced directives and living will; also discussed end of life issues including code status.  -discussed primary hyperparathyroidism treatment options with patient.  Refer for parathyroid update study.  Refer to ENT versus general surgeon to discuss surgical risks considering several cervical spine surgeries. -New onset dysuria.  Send urine culture.  Due to severity of symptoms, will treat with Augmentin while awaiting culture results.   Orders Placed This Encounter  Procedures  . Urine Culture  . NM Parathyroid W/Spect    Standing Status:   Future    Standing Expiration Date:   08/04/2018    Order Specific Question:   If indicated for the ordered procedure, I authorize the administration of a radiopharmaceutical per Radiology protocol    Answer:   Yes    Order Specific Question:   Is the patient pregnant?    Answer:   No    Order Specific Question:   Preferred imaging location?    Answer:   [facility]    Order Specific Question:   Radiology Contrast Protocol - do NOT remove file path    Answer:   [email or web address]  . POCT urinalysis dipstick   Meds ordered this encounter  Medications  . amoxicillin-clavulanate (AUGMENTIN) 875-125 MG tablet    Sig: Take 1 tablet 2 (two) times daily by mouth.    Dispense:  20 tablet    Refill:  0  . amLODipine (NORVASC) 10 MG tablet    Sig: Take 1 tablet (10 mg total) by mouth daily.    Dispense:  90 tablet    Refill:  3  . losartan (COZAAR) 100 MG tablet    Sig: Take 1 tablet (100 mg total) by mouth daily.    Dispense:  90 tablet    Refill:  3  . propranolol (INDERAL) 20 MG tablet    Sig: Take 3 tablets (60 mg total) by mouth 2 (two) times daily.    Dispense:  540 tablet    Refill:  3    Return in about 4 months (around 10/02/2017) for follow-up chronic medical conditions.   Emily Melton MElayne Guerin M.D. [facility]  [phone]

## 2017-06-04 NOTE — Patient Instructions (Addendum)
IF you received an x-ray today, you will receive an invoice from [facility]. Please contact [facility] at [phone] with questions or concerns regarding your invoice.   IF you received labwork today, you will receive an invoice from [facility]. Please contact [facility] at [phone] with questions or concerns regarding your invoice.   Our billing staff will not be able to assist you with questions regarding bills from these companies.  You will be contacted with the lab results as soon as they are available. The fastest way to get your results is to activate your My Chart account. Instructions are located on the last page of this paperwork. If you have not heard from [place] regarding the results in 2 weeks, please contact this office.      Preventive Care 40-64 Years, Female Preventive care refers to lifestyle choices and visits with your health care provider that can promote health and wellness. What does preventive care include?  A yearly physical exam. This is also called an annual well check.  Dental exams once or twice a year.  Routine eye exams. Ask your health care provider how often you should have your eyes checked.  Personal lifestyle choices, including: ? Daily care of your teeth and gums. ? Regular physical activity. ? Eating a healthy diet. ? Avoiding tobacco and drug use. ? Limiting alcohol use. ? Practicing safe sex. ? Taking low-dose aspirin daily starting at age 34. ? Taking vitamin and mineral supplements as recommended by your health care provider. What happens during an annual well check? The services and screenings done by your health care provider during your annual well check will depend on your age, overall health, lifestyle risk factors, and family history of disease. Counseling Your health care provider may ask you questions about your:  Alcohol use.  Tobacco use.  Drug use.  Emotional well-being.  Home and relationship  well-being.  Sexual activity.  Eating habits.  Work and work Statistician.  Method of birth control.  Menstrual cycle.  Pregnancy history.  Screening You may have the following tests or measurements:  Height, weight, and BMI.  Blood pressure.  Lipid and cholesterol levels. These may be checked every 5 years, or more frequently if you are over 65 years old.  Skin check.  Lung cancer screening. You may have this screening every year starting at age 102 if you have a 30-pack-year history of smoking and currently smoke or have quit within the past 15 years.  Fecal occult blood test (FOBT) of the stool. You may have this test every year starting at age 33.  Flexible sigmoidoscopy or colonoscopy. You may have a sigmoidoscopy every 5 years or a colonoscopy every 10 years starting at age 23.  Hepatitis C blood test.  Hepatitis B blood test.  Sexually transmitted disease (STD) testing.  Diabetes screening. This is done by checking your blood sugar (glucose) after you have not eaten for a while (fasting). You may have this done every 1-3 years.  Mammogram. This may be done every 1-2 years. Talk to your health care provider about when you should start having regular mammograms. This may depend on whether you have a family history of breast cancer.  BRCA-related cancer screening. This may be done if you have a family history of breast, ovarian, tubal, or peritoneal cancers.  Pelvic exam and Pap test. This may be done every 3 years starting at age 3. Starting at age 55, this may be done every 5 years if  you have a Pap test in combination with an HPV test.  Bone density scan. This is done to screen for osteoporosis. You may have this scan if you are at high risk for osteoporosis.  Discuss your test results, treatment options, and if necessary, the need for more tests with your health care provider. Vaccines Your health care provider may recommend certain vaccines, such  as:  Influenza vaccine. This is recommended every year.  Tetanus, diphtheria, and acellular pertussis (Tdap, Td) vaccine. You may need a Td booster every 10 years.  Varicella vaccine. You may need this if you have not been vaccinated.  Zoster vaccine. You may need this after age 70.  Measles, mumps, and rubella (MMR) vaccine. You may need at least one dose of MMR if you were born in 1957 or later. You may also need a second dose.  Pneumococcal 13-valent conjugate (PCV13) vaccine. You may need this if you have certain conditions and were not previously vaccinated.  Pneumococcal polysaccharide (PPSV23) vaccine. You may need one or two doses if you smoke cigarettes or if you have certain conditions.  Meningococcal vaccine. You may need this if you have certain conditions.  Hepatitis A vaccine. You may need this if you have certain conditions or if you travel or work in places where you may be exposed to hepatitis A.  Hepatitis B vaccine. You may need this if you have certain conditions or if you travel or work in places where you may be exposed to hepatitis B.  Haemophilus influenzae type b (Hib) vaccine. You may need this if you have certain conditions.  Talk to your health care provider about which screenings and vaccines you need and how often you need them. This information is not intended to replace advice given to you by your health care provider. Make sure you discuss any questions you have with your health care provider. Document Released: 07/30/2015 Document Revised: 03/22/2016 Document Reviewed: 05/04/2015 Elsevier Interactive Patient Education  2017 [employer].

## 2017-06-05 ENCOUNTER — Encounter: Payer: Medicare Other | Admitting: Family Medicine

## 2017-06-05 LAB — URINE CULTURE: Organism ID, Bacteria: NO GROWTH

## 2017-06-14 MED ORDER — PROPRANOLOL HCL 20 MG PO TABS: 60.0000 mg | ORAL_TABLET | Freq: Two times a day (BID) | ORAL | 3 refills | Status: DC

## 2017-06-14 MED ORDER — LOSARTAN POTASSIUM 100 MG PO TABS: 100.0000 mg | ORAL_TABLET | Freq: Every day | ORAL | 3 refills | Status: DC

## 2017-06-14 MED ORDER — AMLODIPINE BESYLATE 10 MG PO TABS: 10.0000 mg | ORAL_TABLET | Freq: Every day | ORAL | 3 refills | Status: DC

## 2017-07-02 ENCOUNTER — Telehealth: Payer: Self-pay | Admitting: Family Medicine

## 2017-07-02 DIAGNOSIS — E213 Hyperparathyroidism, unspecified: Secondary | ICD-10-CM

## 2017-07-02 NOTE — Telephone Encounter (Signed)
Copied from [place] #[id number]. Topic: Referral - Status >> Jun 29, 2017  1:38 PM Synthia Innocent wrote: Reason for CKI:CHTVGV of Endocrinology referral to [facility]. States she was told at last visit she would receive a call, has not heard anything. Please advise _______________________________________________________  Did not see referral for Endo from last visit but did see that a NM Parathyroid had been ordered. This has been sent to [facility] scheduling. I left the pt a vm with the info on how to schedule her scan and wanted to clarify if that is what she was referring to or if she needed an endo referral. I let her know I would place a message and if an Endo referral was discussed, would see if we could get that placed.  Thanks!

## 2017-07-03 NOTE — Telephone Encounter (Signed)
Pt is scheduled to have NM Thyroid on 07/23/17 at [facility].

## 2017-07-03 NOTE — Telephone Encounter (Signed)
We decided to proceed first with NM Parathyroid study.  Please call imaging facility to check status on order.  Did I order the wrong test.....since they have not contacted patient to schedule?  If yes, what test code should I order?

## 2017-07-23 ENCOUNTER — Ambulatory Visit: Payer: 59

## 2017-07-23 ENCOUNTER — Other Ambulatory Visit: Payer: Self-pay

## 2017-08-01 NOTE — Telephone Encounter (Signed)
°  Relation to pt: self Call back number: [phone]  Reason for call:  Patient states she didn't have NM Parathyroid study done because she's afraid once referral is placed with [facility] endocrinologist they may inform her  NM study didn't need to be done. Patient extremely emotional stating she has dealt with [facility] in the past and there very adamant about  "running there own test/study" patient states she would like to speak with Reginia Forts nurse as soon as possible, please advise.

## 2017-08-02 NOTE — Telephone Encounter (Signed)
Pt would like referral for [facility].

## 2017-08-07 NOTE — Telephone Encounter (Signed)
Referral placed.  Mychart message sent.

## 2017-08-07 NOTE — Addendum Note (Signed)
Addended by: Wardell Honour on: 08/07/2017 09:55 AM   Modules accepted: Orders

## 2017-08-21 ENCOUNTER — Other Ambulatory Visit: Payer: Self-pay | Admitting: Gastroenterology

## 2017-08-21 DIAGNOSIS — K760 Fatty (change of) liver, not elsewhere classified: Secondary | ICD-10-CM

## 2017-08-24 ENCOUNTER — Ambulatory Visit
Admission: RE | Admit: 2017-08-24 | Discharge: 2017-08-24 | Disposition: A | Discharge status: Home / Self Care | Payer: 59 | Source: Ambulatory Visit | Attending: Gastroenterology | Admitting: Gastroenterology

## 2017-08-24 DIAGNOSIS — N200 Calculus of kidney: Secondary | ICD-10-CM | POA: Insufficient documentation

## 2017-08-24 DIAGNOSIS — K740 Hepatic fibrosis: Secondary | ICD-10-CM | POA: Diagnosis present

## 2017-08-24 DIAGNOSIS — K760 Fatty (change of) liver, not elsewhere classified: Secondary | ICD-10-CM | POA: Diagnosis present

## 2017-10-03 ENCOUNTER — Ambulatory Visit: Payer: 59 | Admitting: Family Medicine

## 2017-10-03 ENCOUNTER — Other Ambulatory Visit: Payer: Self-pay

## 2017-10-03 ENCOUNTER — Encounter: Payer: Self-pay | Admitting: Family Medicine

## 2017-10-03 VITALS — BP 142/72 | HR 56 | Temp 97.7°F | Resp 16 | Ht 62.68 in | Wt 211.0 lb

## 2017-10-03 DIAGNOSIS — R7302 Impaired glucose tolerance (oral): Secondary | ICD-10-CM

## 2017-10-03 DIAGNOSIS — E7800 Pure hypercholesterolemia, unspecified: Secondary | ICD-10-CM

## 2017-10-03 DIAGNOSIS — G245 Blepharospasm: Secondary | ICD-10-CM | POA: Diagnosis not present

## 2017-10-03 DIAGNOSIS — S139XXA Sprain of joints and ligaments of unspecified parts of neck, initial encounter: Secondary | ICD-10-CM | POA: Diagnosis not present

## 2017-10-03 DIAGNOSIS — J449 Chronic obstructive pulmonary disease, unspecified: Secondary | ICD-10-CM

## 2017-10-03 DIAGNOSIS — E041 Nontoxic single thyroid nodule: Secondary | ICD-10-CM

## 2017-10-03 DIAGNOSIS — I10 Essential (primary) hypertension: Secondary | ICD-10-CM

## 2017-10-03 DIAGNOSIS — D351 Benign neoplasm of parathyroid gland: Secondary | ICD-10-CM | POA: Diagnosis not present

## 2017-10-03 MED ORDER — TIZANIDINE HCL 4 MG PO TABS: 4.0000 mg | ORAL_TABLET | Freq: Four times a day (QID) | ORAL | 0 refills | Status: DC | PRN

## 2017-10-03 MED ORDER — HYDROCODONE-ACETAMINOPHEN 5-325 MG PO TABS: 1.0000 | ORAL_TABLET | Freq: Four times a day (QID) | ORAL | 0 refills | Status: DC | PRN

## 2017-10-03 NOTE — Patient Instructions (Signed)
     IF you received an x-ray today, you will receive an invoice from [facility]. Please contact  Radiology at [phone] with questions or concerns regarding your invoice.   IF you received labwork today, you will receive an invoice from [facility]. Please contact [facility] at [phone] with questions or concerns regarding your invoice.   Our billing staff will not be able to assist you with questions regarding bills from these companies.  You will be contacted with the lab results as soon as they are available. The fastest way to get your results is to activate your My Chart account. Instructions are located on the last page of this paperwork. If you have not heard from us regarding the results in 2 weeks, please contact this office.     

## 2017-10-03 NOTE — Progress Notes (Signed)
Subjective:    Patient ID: Emily Melton, female    DOB: 1961-06-01, 57 y.o.   MRN: [id number]  10/03/2017  Chronic Conditions (3 month follow-up )    HPI This 57 y.o. female presents for four month follow-up of hypertension, hyperparathyroidism, DDD cervical spine, GERD.   Able to go see son and granddaughter over the holidays. Got sick at Christmas; presented to Urgent Care.  Restarted Augmentin for ten days; returned 07/21/17; recovered. GI provider 06/20/18; ultrasound; kidney stone now 37m on LEFT. Liver is stable; common bile duct stable; [facility]; common bile duct due to gallbladder resection and opiate.  [facility]2018[facility]; good job with workup.  Saw general surgeon and felt work up was excellent.  Ruled out appropriate concerning dx.  S/p labs.  Calcium 11.7; PTH 161.  Recommended increasing water increase per day.  S/ CT scan with contrast two days ago.  That day, saw surgeon; reviewed CT but could not see images due to cervical spine hardware.  Saw one parathyroid gland that is overactive.    S/p thyroid nodule with biopsy of 1.7cm solid nodule; negative pathology. Plans to resect thyroid nodule.   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  Will determine to undergo sestamebi scan.  Will try to undergo smaller incision.  Follow up Nov 22, 2017; then will follow-up with endocrinology. [facility] if vocal cord damage from previous surgeries.   Neck spasm: L sided; occurs with movement in neck; intermittent; not constant.  Spasms and then causes electric shock.   L eye twitching: might be stress.  Mild vertigo: occurs in bed with rolling. S/p ENT consultation; no recurrence since Eply's maneuver.   BP Readings from Last 3 Encounters:  10/03/17 (!) 142/72  06/04/17 132/72  04/20/17 128/76   Wt Readings from Last 3 Encounters:  10/03/17 211 lb (95.7 kg)  06/04/17 211 lb (95.7 kg)  04/20/17 209 lb  (94.8 kg)   Immunization History  Administered Date(s) Administered  . Influenza Split 06/21/2011  . Influenza,inj,Quad PF,6+ Mos 04/30/2013, 05/22/2014, 04/05/2015, 04/20/2016, 04/20/2017  . Influenza-Unspecified 04/05/2015  . Pneumococcal Polysaccharide-23 04/26/2010, 07/18/2011  . Tdap 08/01/2010    Review of Systems  Constitutional: Negative for chills, diaphoresis, fatigue and fever.  Eyes: Negative for visual disturbance.  Respiratory: Negative for cough and shortness of breath.   Cardiovascular: Negative for chest pain, palpitations and leg swelling.  Gastrointestinal: Negative for abdominal pain, constipation, diarrhea, nausea and vomiting.  Endocrine: Negative for cold intolerance, heat intolerance, polydipsia, polyphagia and polyuria.  Musculoskeletal: Positive for myalgias, neck pain and neck stiffness.  Neurological: Positive for dizziness. Negative for tremors, seizures, syncope, facial asymmetry, speech difficulty, weakness, light-headedness, numbness and headaches.  Psychiatric/Behavioral: Positive for dysphoric mood. The patient is nervous/anxious.     Past Medical History:  Diagnosis Date  . Allergic rhinitis, cause unspecified   . Allergy   . Anemia   . Arthritis    C-spine; 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 Barrett's esophagus   . Cervical radiculopathy   . Chicken pox    childhood  . Depression   . Diaphragmatic hernia without mention of obstruction or gangrene   . Fibromyalgia   . Fibromyalgia   . GERD (gastroesophageal reflux disease)   . HTN (hypertension)   . Insomnia, unspecified   . Migraine, unspecified, without mention of intractable migraine without mention of  status migrainosus   . Narcolepsy    w/o cataplexy  . Nonspecific abnormal electrocardiogram (ECG) (EKG)   . Osteoporosis   . Other abnormal glucose   . Periodic limb movement disorder   . Personal history of colonic polyps   . Postlaminectomy  syndrome of cervical region   . Tobacco use disorder   . Unspecified vitamin D deficiency    Past Surgical History:  Procedure Laterality Date  . ABDOMINAL HYSTERECTOMY     fibroids and endometriosis.  ovarian resection R.  . BACK SURGERY    . BREAST EXCISIONAL BIOPSY  2005    R   Benign  . BREAST SURGERY    . carpal tunnel release - bilateral    . CESAREAN SECTION     x2  1984 and 1986  . CHOLECYSTECTOMY    . 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 COLONOSCOPY  03/17/2012   [facility]  . COLONOSCOPY WITH PROPOFOL N/A 12/08/2016   Procedure: COLONOSCOPY WITH PROPOFOL;  Surgeon: Lollie Sails, MD;  [facility];  Service: Endoscopy;  Laterality: N/A;  . DIAGNOSTIC LAPAROSCOPY    . DILATION AND CURETTAGE OF UTERUS     SAB  . ESOPHAGOGASTRODUODENOSCOPY  03/17/2012   no Barrett's esophagus. [facility].  . fusion of cervical spine  2007   C5,C6,C7, and T1  . KNEE SURGERY    . laprascopy  2009   for RLQ pain:R oophorectomy performed no improvement in pain.   . pilonidal cyst resection  1992  . R shoulder surgery  06/24/09   2 tears in rotator cuff,bone spurs. Dr. Modena Morrow  . SHOULDER ARTHROSCOPY Right 2010  . SPINE SURGERY  09/2011   Cervical spine fusion  . TUBAL LIGATION    . tubel ligation  2002  . VAGINAL HYSTERECTOMY  2009   fibroid/endometriosis, one L ovary remaining   Allergies  Allergen Reactions  . Duloxetine Hcl Other (See Comments)    Hypertension, mood changes  . Mirtazapine Swelling  . Serotonin Reuptake Inhibitors (Ssris) Other (See Comments)    Hypertension, mood changes  . St Johns Wort Anaphylaxis  . Bupropion Hcl Other (See Comments)    Mental changes and high blood pressure   . Celebrex [Celecoxib] Hypertension  . Effexor [Venlafaxine] Other (See Comments)    Moodiness, vivid dreams, poor sleep cycle.  Doristine Bosworth [Gabapentin (Once-Daily)] Cough  . Lyrica [Pregabalin] Other (See Comments) and Hypertension    Mood changes, eye  twitching and UTI  . Methylphenidate Hcl Other (See Comments)    unknown  . Mirapex [Pramipexole]     HTN, hallucinations and nausea  . Modafinil Swelling and Hypertension  . Rabeprazole Sodium Swelling and Other (See Comments)    Headaches   . Savella [Milnacipran Hcl] Other (See Comments)  . Zegerid [Omeprazole] Other (See Comments)  . Lisinopril Rash    Hypertension   Current Outpatient Medications on File Prior to Visit  Medication Sig Dispense Refill  . amLODipine (NORVASC) 10 MG tablet Take 1 tablet (10 mg total) by mouth daily. 90 tablet 3  . cetirizine (ZYRTEC) 10 MG tablet Take 10 mg by mouth daily.      . hyoscyamine (ANASPAZ) 0.125 MG TBDP Place 0.125-0.25 mg under the tongue every 6 (six) hours as needed for cramping. For stomach cramps    . losartan (COZAAR) 100 MG tablet Take 1 tablet (100 mg total) by mouth daily. 90 tablet 3  . montelukast (SINGULAIR) 10 MG tablet take  1 tablet by mouth at bedtime 30 tablet 12  . PROAIR HFA 108 (90 Base) MCG/ACT inhaler inhale 2 puffs by mouth every 4 to 6 hours if needed for BREATHING  0  . propranolol (INDERAL) 20 MG tablet Take 3 tablets (60 mg total) by mouth 2 (two) times daily. 540 tablet 3   No current facility-administered medications on file prior to visit.    Social History   Socioeconomic History  . Marital status: Married    Spouse name: Not on file  . Number of children: 2  . Years of education: High Schol  . Highest education level: Not on file  Social Needs  . Financial resource strain: Not on file  . Food insecurity - worry: Not on file  . Food insecurity - inability: Not on file  . Transportation needs - medical: Not on file  . Transportation needs - non-medical: Not on file  Occupational History  . Occupation: Disabled    Employer: Psychologist, forensic  Tobacco Use  . Smoking status: Former Smoker    Packs/day: 1.00    Years: 36.00    Pack years: 36.00    Types: Cigarettes    Last attempt to quit:  11/28/2013    Years since quitting: 3.8  . Smokeless tobacco: Former Systems developer    Quit date: 11/19/2013  . Tobacco comment: smoked 1 ppd 30-35 years; quit in 2011  Substance and Sexual Activity  . Alcohol use: No    Alcohol/week: 0.0 oz    Comment: RARELY  . Drug use: No  . Sexual activity: Yes    Birth control/protection: Surgical  Other Topics Concern  . Not on file  Social History Narrative   Patient exercises 3-4 times per week (walking x 20 minutes 3 days a week).    Marital status:  Married x 11 years; happily married; second marriage; no abuse.      Children:  2 children(31, 29); three grandsons.      Employment:  Long term disability through work; formal disability 12/2015.  Quit working 02/2013.      Tobacco:  1/2 ppd x 34 years.; quit smoking 11/2013.       Alcohol: rare;        Drugs: none      Exercise:  Rare       lives in [place].       Seatbelt:  100%       Guns: none   1-2 cups of coffee a day/ rare occasional Pepsi    Family History  Problem Relation Age of Onset  . Bipolar disorder Mother   . Cancer Mother        bladder  . Hypertension Mother   . Colon polyps Mother   . Osteoarthritis Mother   . Diabetes Mother   . Thyroid disease Mother   . Heart disease Father 38       CAD/AMI with stenting/PAD.  Marland Kitchen Hyperlipidemia Father   . Cancer Father        skin  . Angina Father   . Asthma Daughter   . Cancer Maternal Grandmother        colon  . Heart disease Maternal Grandmother   . Cancer Maternal Grandfather        lung  . Parkinson's disease Maternal Grandfather   . Stroke Maternal Grandfather   . Diabetes Maternal Grandfather   . Hyperlipidemia Maternal Grandfather   . Rheum arthritis Paternal Grandmother   . Heart attack Paternal  Grandfather   . Pulmonary fibrosis Paternal Aunt   . Pulmonary fibrosis Paternal Uncle        Objective:    BP (!) 142/72   Pulse (!) 56   Temp 97.7 F (36.5 C) (Oral)   Resp 16   Ht 5' 2.68" (1.592 m)   Wt 211 lb  (95.7 kg)   SpO2 95%   BMI 37.76 kg/m  Physical Exam  Constitutional: She is oriented to person, place, and time. She appears well-developed and well-nourished. No distress.  HENT:  Head: Normocephalic and atraumatic.  Right Ear: External ear normal.  Left Ear: External ear normal.  Nose: Nose normal.  Mouth/Throat: Oropharynx is clear and moist.  Eyes: Conjunctivae and EOM are normal. Pupils are equal, round, and reactive to light.  Neck: Normal range of motion. Neck supple. Carotid bruit is not present. No thyromegaly present.  Cardiovascular: Normal rate, regular rhythm, normal heart sounds and intact distal pulses. Exam reveals no gallop and no friction rub.  No murmur heard. Pulmonary/Chest: Effort normal and breath sounds normal. She has no wheezes. She has no rales.  Abdominal: Soft. Bowel sounds are normal. She exhibits no distension and no mass. There is no tenderness. There is no rebound and no guarding.  Musculoskeletal:       Cervical back: She exhibits decreased range of motion, tenderness, pain and spasm. She exhibits no bony tenderness and normal pulse.  Lymphadenopathy:    She has no cervical adenopathy.  Neurological: She is alert and oriented to person, place, and time. No cranial nerve deficit. She exhibits normal muscle tone. Coordination normal.  Skin: Skin is warm and dry. No rash noted. She is not diaphoretic. No erythema. No pallor.  Psychiatric: She has a normal mood and affect. Her behavior is normal. Judgment and thought content normal.   No results found. Depression screen [facility] 2/9 10/03/2017 06/04/2017 02/07/2017 11/21/2016 07/18/2016  Decreased Interest 0 0 0 0 0  Down, Depressed, Hopeless 0 0 0 0 0  PHQ - 2 Score 0 0 0 0 0  Altered sleeping - - - - -  Tired, decreased energy - - - - -  Change in appetite - - - - -  Feeling bad or failure about yourself  - - - - -  Trouble concentrating - - - - -  Moving slowly or fidgety/restless - - - - -  Suicidal  thoughts - - - - -  PHQ-9 Score - - - - -   Fall Risk  10/03/2017 06/04/2017 11/21/2016 07/18/2016 05/24/2016  Falls in the past year? No No No No Yes  Number falls in past yr: - - - - 2 or more        Assessment & Plan:   1. Essential hypertension, benign   2. COPD mixed type ([place])   3. Pure hypercholesterolemia   4. Glucose intolerance (impaired glucose tolerance)   5. Thyroid nodule   6. Parathyroid adenoma   7. Neck sprain, initial encounter   8. Eye twitch    Controlled chronic disease management; labs reviewed in detail during visit.  No changes to manaagement.  Thyroid nodule: s/p biopsy.  Parathyroid adenoma: s/p ENT consultation; to undergo sestamebi scan.   Neck sprain: New/recurrent; rx for Zanaflex and hydrocodone provided.  Eye twitching: New onset; most consistent with stress reaction.     No orders of the defined types were placed in this encounter.  Meds ordered this encounter  Medications  . HYDROcodone-acetaminophen (NORCO/VICODIN)  5-325 MG tablet    Sig: Take 1 tablet by mouth every 6 (six) hours as needed for moderate pain.    Dispense:  20 tablet    Refill:  0  . tiZANidine (ZANAFLEX) 4 MG tablet    Sig: Take 1 tablet (4 mg total) by mouth every 6 (six) hours as needed for muscle spasms.    Dispense:  30 tablet    Refill:  0    Return in about 4 months (around 02/02/2018).   Calena Salem Elayne Guerin, M.D. Primary Care at [facility] [address] [place], [place]  [address] [phone] phone [phone] fax

## 2017-10-29 ENCOUNTER — Telehealth: Payer: Self-pay

## 2017-10-29 NOTE — Telephone Encounter (Signed)
Copied from [place] [id number]. Topic: Inquiry >> Oct 29, 2017  4:14 PM Oliver Pila B wrote: Reason for CRM: pt called about her Guardian insurance docs concerning disability, pt states she gave those docs to her pcp on the 3.20.19 appt, the recipient hasnt received that documentation yet and the pt is checking the status

## 2017-10-29 NOTE — Telephone Encounter (Signed)
I have not gotten any paperwork for this patient I will send this to her PCP and see if she has it in her box  Have you seen any paperwork for this patient? If you have this please make sure a copy gets put on my desk so I can scan it into her chart. Thank you !

## 2017-11-09 NOTE — Telephone Encounter (Signed)
Patient is calling to check on the status of this. Please contact.

## 2017-11-12 NOTE — Telephone Encounter (Signed)
Has this paperwork been processed?

## 2017-11-16 MED ORDER — GENERIC EXTERNAL MEDICATION
60.00 | Status: DC
Start: 2017-11-16 — End: 2017-11-16

## 2017-11-16 MED ORDER — MONTELUKAST SODIUM 10 MG PO TABS
10.00 | ORAL_TABLET | ORAL | Status: DC
Start: 2017-11-16 — End: 2017-11-16

## 2017-11-16 MED ORDER — CALCIUM CARBONATE ANTACID 750 MG PO CHEW
CHEWABLE_TABLET | ORAL | Status: DC
Start: 2017-11-16 — End: 2017-11-16

## 2017-11-16 MED ORDER — AMLODIPINE BESYLATE 10 MG PO TABS
10.00 | ORAL_TABLET | ORAL | Status: DC
Start: 2017-11-17 — End: 2017-11-16

## 2017-11-16 MED ORDER — LOSARTAN POTASSIUM 50 MG PO TABS
100.00 | ORAL_TABLET | ORAL | Status: DC
Start: 2017-11-16 — End: 2017-11-16

## 2017-11-16 MED ORDER — FEXOFENADINE HCL 180 MG PO TABS
180.00 | ORAL_TABLET | ORAL | Status: DC
Start: 2017-11-17 — End: 2017-11-16

## 2017-11-16 MED ORDER — SENNOSIDES-DOCUSATE SODIUM 8.6-50 MG PO TABS
2.00 | ORAL_TABLET | ORAL | Status: DC
Start: 2017-11-16 — End: 2017-11-16

## 2017-11-16 MED ORDER — SODIUM CHLORIDE 0.9 % IJ SOLN
5.00 | INTRAMUSCULAR | Status: DC
Start: ? — End: 2017-11-16

## 2017-11-16 MED ORDER — ONDANSETRON HCL 4 MG/2ML IJ SOLN
4.00 | INTRAMUSCULAR | Status: DC
Start: ? — End: 2017-11-16

## 2017-11-16 MED ORDER — ALBUTEROL SULFATE HFA 108 (90 BASE) MCG/ACT IN AERS
1.00 | INHALATION_SPRAY | RESPIRATORY_TRACT | Status: DC
Start: ? — End: 2017-11-16

## 2017-11-16 MED ORDER — DIPHENHYDRAMINE HCL 12.5 MG/5ML PO ELIX
6.25 | ORAL_SOLUTION | ORAL | Status: DC
Start: ? — End: 2017-11-16

## 2017-11-16 MED ORDER — HYDROCODONE-ACETAMINOPHEN 5-325 MG PO TABS
1.00 | ORAL_TABLET | ORAL | Status: DC
Start: ? — End: 2017-11-16

## 2017-11-26 ENCOUNTER — Encounter: Payer: Self-pay | Admitting: Family Medicine

## 2017-12-04 NOTE — Telephone Encounter (Signed)
Will follow-up with Provider on paper work tomorrow.

## 2017-12-10 ENCOUNTER — Encounter: Payer: Self-pay | Admitting: Family Medicine

## 2017-12-18 ENCOUNTER — Encounter: Payer: Self-pay | Admitting: Family Medicine

## 2017-12-18 NOTE — Telephone Encounter (Signed)
Life Insurance paperwork completed.  Will provide to disability team member.

## 2017-12-18 NOTE — Telephone Encounter (Signed)
Dr Tamala Julian do you have this form, I have not seen this and there is nothing scanned in the patients chart. I will have medical records look and see if they have it but I do not see anything scanned in the chart.  There was telephone message prior to this one on 10/29/17 where [place] stated she would check on the forms but nothing else was said.  Can we see if we can find this form thank you

## 2017-12-28 NOTE — Telephone Encounter (Signed)
Please send my last three office visits for patient to disability insurance company.

## 2018-01-30 ENCOUNTER — Ambulatory Visit (INDEPENDENT_AMBULATORY_CARE_PROVIDER_SITE_OTHER): Payer: 59 | Admitting: Family Medicine

## 2018-01-30 ENCOUNTER — Other Ambulatory Visit: Payer: Self-pay

## 2018-01-30 ENCOUNTER — Ambulatory Visit (HOSPITAL_COMMUNITY)
Admission: RE | Admit: 2018-01-30 | Discharge: 2018-01-30 | Disposition: A | Discharge status: Home / Self Care | Payer: 59 | Source: Ambulatory Visit | Attending: Family Medicine | Admitting: Family Medicine

## 2018-01-30 ENCOUNTER — Encounter: Payer: Self-pay | Admitting: Family Medicine

## 2018-01-30 ENCOUNTER — Other Ambulatory Visit: Payer: Self-pay | Admitting: Family Medicine

## 2018-01-30 VITALS — BP 132/72 | HR 67 | Temp 98.1°F | Resp 16 | Ht 62.6 in | Wt 221.0 lb

## 2018-01-30 DIAGNOSIS — M5030 Other cervical disc degeneration, unspecified cervical region: Secondary | ICD-10-CM

## 2018-01-30 DIAGNOSIS — I10 Essential (primary) hypertension: Secondary | ICD-10-CM

## 2018-01-30 DIAGNOSIS — M7989 Other specified soft tissue disorders: Secondary | ICD-10-CM | POA: Insufficient documentation

## 2018-01-30 DIAGNOSIS — E7800 Pure hypercholesterolemia, unspecified: Secondary | ICD-10-CM | POA: Diagnosis not present

## 2018-01-30 DIAGNOSIS — D351 Benign neoplasm of parathyroid gland: Secondary | ICD-10-CM

## 2018-01-30 DIAGNOSIS — E210 Primary hyperparathyroidism: Secondary | ICD-10-CM

## 2018-01-30 DIAGNOSIS — E041 Nontoxic single thyroid nodule: Secondary | ICD-10-CM | POA: Diagnosis not present

## 2018-01-30 DIAGNOSIS — K7581 Nonalcoholic steatohepatitis (NASH): Secondary | ICD-10-CM

## 2018-01-30 DIAGNOSIS — Z6839 Body mass index (BMI) 39.0-39.9, adult: Secondary | ICD-10-CM

## 2018-01-30 LAB — POCT URINALYSIS DIP (MANUAL ENTRY)
Bilirubin, UA: NEGATIVE
Blood, UA: NEGATIVE
Glucose, UA: NEGATIVE mg/dL
Ketones, POC UA: NEGATIVE mg/dL
Leukocytes, UA: NEGATIVE
Nitrite, UA: NEGATIVE
Protein Ur, POC: NEGATIVE mg/dL
Spec Grav, UA: 1.015 (ref 1.010–1.025)
Urobilinogen, UA: 0.2 E.U./dL
pH, UA: 7.5 (ref 5.0–8.0)

## 2018-01-30 MED ORDER — PROPRANOLOL HCL 20 MG PO TABS: 60.0000 mg | ORAL_TABLET | Freq: Two times a day (BID) | ORAL | 3 refills | Status: AC

## 2018-01-30 MED ORDER — TIZANIDINE HCL 4 MG PO TABS: 4.0000 mg | ORAL_TABLET | Freq: Four times a day (QID) | ORAL | 2 refills | Status: AC | PRN

## 2018-01-30 MED ORDER — AMLODIPINE BESYLATE 10 MG PO TABS: 10.0000 mg | ORAL_TABLET | Freq: Every day | ORAL | 3 refills | Status: DC

## 2018-01-30 MED ORDER — MONTELUKAST SODIUM 10 MG PO TABS: 10.0000 mg | ORAL_TABLET | Freq: Every day | ORAL | 3 refills | Status: AC

## 2018-01-30 MED ORDER — LOSARTAN POTASSIUM 100 MG PO TABS: 100.0000 mg | ORAL_TABLET | Freq: Every day | ORAL | 3 refills | Status: DC

## 2018-01-30 NOTE — Progress Notes (Addendum)
BLE venous duplex prelim: negative for DVT. Landry Mellow, RDMS, RVT  Called results to [facility]

## 2018-01-30 NOTE — Progress Notes (Signed)
Subjective:    Patient ID: Emily Melton, female    DOB: [birth year], 57 y.o.   MRN: [id number]  01/30/2018  Chronic Conditions (4 month follow-up )    HPI This 57 y.o. female presents for four month follow-up of hypertension, hyperparathyroidism, hypercholesterolemia, impaired glucose, chronic pain syndrome due to degenerative disc disease of neck.  Management changes made last visit include the following: Controlled chronic disease management; labs reviewed in detail during visit.  No changes to manaagement. Thyroid nodule: s/p biopsy. Parathyroid adenoma: s/p ENT consultation; to undergo sestamebi scan.  Neck sprain: New/recurrent; rx for Zanaflex and hydrocodone provided. Eye twitching: New onset; most consistent with stress reaction.    UPDATE: S/P parathyroidectomy; thyroid was embedded in thyroid; removed RIGHT lobe of thyroid. S/p emergency surgery POD#0; requested additional medication for pain control.  Started having a hard time breathing.  Pain worsening in throat.  Had developed bleeding from bandage.   Went back for emergency surgery to remove hematoma.   Did well post-operatively.   Blood pressure dropping to 70/40s.  Patient very hysterical with SOB and neck pain. Had drain post-operatively; suffered with infected drain site.   Presented on 11/23/17 for follow-up; redness at insertion site. Prescribed Keflex. Underwent I&D with packing.   Started Synthroid 74mg on 12/12/17.  Started citracel on 12/04/17. 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   Home blood pressures have been running 130s to 145 over 70s to 85's. Patient has suffered with lower extremity edema since parathyroidectomy in May 2019.  The left leg is significantly more swollen than the right leg.  She has had calf pain and ankle pain associated with swelling postoperatively.  She did wear compression stockings  during admission.  Denies associated chest pain, palpitations, orthopnea, dyspnea on exertion.  Patient does admit to sedentary lifestyle for the past 2 months that she recovers from surgery.  BP Readings from Last 3 Encounters:  01/30/18 132/72  10/03/17 (!) 142/72  06/04/17 132/72   Wt Readings from Last 3 Encounters:  01/30/18 221 lb (100.2 kg)  10/03/17 211 lb (95.7 kg)  06/04/17 211 lb (95.7 kg)   Immunization History  Administered Date(s) Administered  . Influenza Split 06/21/2011  . Influenza,inj,Quad PF,6+ Mos 04/30/2013, 05/22/2014, 04/05/2015, 04/20/2016, 04/20/2017  . Influenza-Unspecified 04/05/2015  . Pneumococcal Polysaccharide-23 04/26/2010, 07/18/2011  . Tdap 08/01/2010    Review of Systems  Constitutional: Negative for activity change, appetite change, chills, diaphoresis, fatigue, fever and unexpected weight change.  HENT: Negative for congestion, dental problem, drooling, ear discharge, ear pain, facial swelling, hearing loss, mouth sores, nosebleeds, postnasal drip, rhinorrhea, sinus pressure, sneezing, sore throat, tinnitus, trouble swallowing and voice change.   Eyes: Negative for photophobia, pain, discharge, redness, itching and visual disturbance.  Respiratory: Negative for apnea, cough, choking, chest tightness, shortness of breath, wheezing and stridor.   Cardiovascular: Negative for chest pain, palpitations and leg swelling.  Gastrointestinal: Negative for abdominal distention, abdominal pain, anal bleeding, blood in stool, constipation, diarrhea, nausea, rectal pain and vomiting.  Endocrine: Negative for cold intolerance, heat intolerance, polydipsia, polyphagia and polyuria.  Genitourinary: Negative for decreased urine volume, difficulty urinating, dyspareunia, dysuria, enuresis, flank pain, frequency, genital sores, hematuria, menstrual problem, pelvic pain, urgency, vaginal bleeding, vaginal discharge and vaginal pain.  Musculoskeletal: Positive for  arthralgias, back pain, myalgias, neck pain and neck stiffness. Negative for gait problem and joint swelling.  Skin: Negative for color change, pallor, rash  and wound.  Allergic/Immunologic: Negative for environmental allergies, food allergies and immunocompromised state.  Neurological: Negative for dizziness, tremors, seizures, syncope, facial asymmetry, speech difficulty, weakness, light-headedness, numbness and headaches.  Hematological: Negative for adenopathy. Does not bruise/bleed easily.  Psychiatric/Behavioral: Negative for agitation, behavioral problems, confusion, decreased concentration, dysphoric mood, hallucinations, self-injury, sleep disturbance and suicidal ideas. The patient is not nervous/anxious and is not hyperactive.     Past Medical History:  Diagnosis Date  . Allergic rhinitis, cause unspecified   . Allergy   . Anemia   . Arthritis    C-spine; 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 Barrett's esophagus   . Cervical radiculopathy   . Chicken pox    childhood  . Depression   . Diaphragmatic hernia without mention of obstruction or gangrene   . Fibromyalgia   . Fibromyalgia   . GERD (gastroesophageal reflux disease)   . HTN (hypertension)   . Insomnia, unspecified   . Migraine, unspecified, without mention of intractable migraine without mention of status migrainosus   . Narcolepsy    w/o cataplexy  . Nonspecific abnormal electrocardiogram (ECG) (EKG)   . Osteoporosis   . Other abnormal glucose   . Periodic limb movement disorder   . Personal history of colonic polyps   . Postlaminectomy syndrome of cervical region   . Tobacco use disorder   . Unspecified vitamin D deficiency    Past Surgical History:  Procedure Laterality Date  . ABDOMINAL HYSTERECTOMY     fibroids and endometriosis.  ovarian resection R.  . BACK SURGERY    . BREAST EXCISIONAL BIOPSY  2005    R   Benign  . BREAST SURGERY    . carpal tunnel release -  bilateral    . CESAREAN SECTION     x2  1984 and 1986  . CHOLECYSTECTOMY    . 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 COLONOSCOPY  03/17/2012   [facility]  . COLONOSCOPY WITH PROPOFOL N/A 12/08/2016   Procedure: COLONOSCOPY WITH PROPOFOL;  Surgeon: Lollie Sails, MD;  Location: [facility] ENDOSCOPY;  Service: Endoscopy;  Laterality: N/A;  . DIAGNOSTIC LAPAROSCOPY    . DILATION AND CURETTAGE OF UTERUS     SAB  . ESOPHAGOGASTRODUODENOSCOPY  03/17/2012   no Barrett's esophagus. [facility].  . fusion of cervical spine  2007   C5,C6,C7, and T1  . KNEE SURGERY    . laprascopy  2009   for RLQ pain:R oophorectomy performed no improvement in pain.   . pilonidal cyst resection  1992  . R shoulder surgery  06/24/09   2 tears in rotator cuff,bone spurs. Dr. Modena Morrow  . SHOULDER ARTHROSCOPY Right 2010  . SPINE SURGERY  09/2011   Cervical spine fusion  . TUBAL LIGATION    . tubel ligation  2002  . VAGINAL HYSTERECTOMY  2009   fibroid/endometriosis, one L ovary remaining   Allergies  Allergen Reactions  . Duloxetine Hcl Other (See Comments)    Hypertension, mood changes  . Mirtazapine Swelling  . Serotonin Reuptake Inhibitors (Ssris) Other (See Comments)    Hypertension, mood changes  . St Johns Wort Anaphylaxis  . Bupropion Hcl Other (See Comments)    Mental changes and high blood pressure   . Celebrex [Celecoxib] Hypertension  . Effexor [Venlafaxine] Other (See Comments)    Moodiness, vivid dreams, poor sleep cycle.  Doristine Bosworth [Gabapentin (Once-Daily)] Cough  . Lyrica [Pregabalin] Other (See Comments)  and Hypertension    Mood changes, eye twitching and UTI  . Methylphenidate Hcl Other (See Comments)    unknown  . Mirapex [Pramipexole]     HTN, hallucinations and nausea  . Modafinil Swelling and Hypertension  . Rabeprazole Sodium Swelling and Other (See Comments)    Headaches   . Savella [Milnacipran Hcl] Other (See Comments)  . Zegerid [Omeprazole] Other (See Comments)  .  Lisinopril Rash    Hypertension   Current Outpatient Medications on File Prior to Visit  Medication Sig Dispense Refill  . cetirizine (ZYRTEC) 10 MG tablet Take 10 mg by mouth daily.      Marland Kitchen HYDROcodone-acetaminophen (NORCO/VICODIN) 5-325 MG tablet Take 1 tablet by mouth every 6 (six) hours as needed for moderate pain. 20 tablet 0  . hyoscyamine (ANASPAZ) 0.125 MG TBDP Place 0.125-0.25 mg under the tongue every 6 (six) hours as needed for cramping. For stomach cramps    . PROAIR HFA 108 (90 Base) MCG/ACT inhaler inhale 2 puffs by mouth every 4 to 6 hours if needed for BREATHING  0   No current facility-administered medications on file prior to visit.    Social History   Socioeconomic History  . Marital status: Married    Spouse name: Not on file  . Number of children: 2  . Years of education: High Schol  . Highest education level: Not on file  Occupational History  . Occupation: Disabled    Fish farm manager: [place] [employer]  . Financial resource strain: Not on file  . Food insecurity:    Worry: Not on file    Inability: Not on file  . Transportation needs:    Medical: Not on file    Non-medical: Not on file  Tobacco Use  . Smoking status: Former Smoker    Packs/day: 1.00    Years: 36.00    Pack years: 36.00    Types: Cigarettes    Last attempt to quit: 11/28/2013    Years since quitting: 4.1  . Smokeless tobacco: Former Systems developer    Quit date: 11/19/2013  . Tobacco comment: smoked 1 ppd 30-35 years; quit in 2011  Substance and Sexual Activity  . Alcohol use: No    Alcohol/week: 0.0 oz    Comment: RARELY  . Drug use: No    Types: Flunitrazepam  . Sexual activity: Yes    Birth control/protection: Surgical  Lifestyle  . Physical activity:    Days per week: Not on file    Minutes per session: Not on file  . Stress: Not on file  Relationships  . Social connections:    Talks on phone: Not on file    Gets together: Not on file    Attends religious service: Not on  file    Active member of club or organization: Not on file    Attends meetings of clubs or organizations: Not on file    Relationship status: Not on file  . Intimate partner violence:    Fear of current or ex partner: Not on file    Emotionally abused: Not on file    Physically abused: Not on file    Forced sexual activity: Not on file  Other Topics Concern  . Not on file  Social History Narrative   Patient exercises 3-4 times per week (walking x 20 minutes 3 days a week).    Marital status:  Married x 11 years; happily married; second marriage; no abuse.      Children:  2 children(31, 29); three grandsons.      Employment:  Long term disability through work; formal disability 12/2015.  Quit working 02/2013.      Tobacco:  1/2 ppd x 34 years.; quit smoking 11/2013.       Alcohol: rare;        Drugs: none      Exercise:  Rare       lives in [place].       Seatbelt:  100%       Guns: none   1-2 cups of coffee a day/ rare occasional P[employer]    Family History  Problem Relation Age of Onset  . Bipolar disorder Mother   . Cancer Mother        bladder  . Hypertension Mother   . Colon polyps Mother   . Osteoarthritis Mother   . Diabetes Mother   . Thyroid disease Mother   . Heart disease Father 35       CAD/AMI with stenting/PAD.  Marland Kitchen Hyperlipidemia Father   . Cancer Father        skin  . Angina Father   . Asthma Daughter   . Cancer Maternal Grandmother        colon  . Heart disease Maternal Grandmother   . Cancer Maternal Grandfather        lung  . Parkinson's disease Maternal Grandfather   . Stroke Maternal Grandfather   . Diabetes Maternal Grandfather   . Hyperlipidemia Maternal Grandfather   . Rheum arthritis Paternal Grandmother   . Heart attack Paternal Grandfather   . Pulmonary fibrosis Paternal Aunt   . Pulmonary fibrosis Paternal Uncle        Objective:    BP 132/72   Pulse 67   Temp 98.1 F (36.7 C) (Oral)   Resp 16   Ht 5' 2.6" (1.59 m)   Wt 221 lb  (100.2 kg)   SpO2 93%   BMI 39.65 kg/m  Physical Exam  Constitutional: She is oriented to person, place, and time. She appears well-developed and well-nourished. No distress.  HENT:  Head: Normocephalic and atraumatic.  Right Ear: External ear normal.  Left Ear: External ear normal.  Nose: Nose normal.  Mouth/Throat: Oropharynx is clear and moist.  Eyes: Pupils are equal, round, and reactive to light. Conjunctivae and EOM are normal.  Neck: Normal range of motion and full passive range of motion without pain. Neck supple. No JVD present. Carotid bruit is not present. No tracheal deviation present. No thyromegaly present.  Well-healed incision along the right anterior neck region.  Cardiovascular: Normal rate, regular rhythm and normal heart sounds. Exam reveals no gallop and no friction rub.  No murmur heard. 1-2+ edema up to proximal calf in the left lower extremity.  1+ edema to distal calf in the right lower extremity.  Pulmonary/Chest: Effort normal and breath sounds normal. She has no wheezes. She has no rales.  Abdominal: Soft. Bowel sounds are normal. She exhibits no distension and no mass. There is no tenderness. There is no rebound and no guarding.  Musculoskeletal: She exhibits edema.       Right shoulder: Normal.       Left shoulder: Normal.       Cervical back: Normal.  Lymphadenopathy:    She has no cervical adenopathy.  Neurological: She is alert and oriented to person, place, and time. She has normal reflexes. No cranial nerve deficit. She exhibits normal muscle tone. Coordination normal.  Skin: Skin  is warm and dry. No rash noted. She is not diaphoretic. No erythema. No pallor.  Psychiatric: She has a normal mood and affect. Her behavior is normal. Judgment and thought content normal.  Nursing note and vitals reviewed.  No results found. Depression screen [facility] 2/9 01/30/2018 10/03/2017 06/04/2017 02/07/2017 11/21/2016  Decreased Interest 0 0 0 0 0  Down, Depressed,  Hopeless 0 0 0 0 0  PHQ - 2 Score 0 0 0 0 0  Altered sleeping - - - - -  Tired, decreased energy - - - - -  Change in appetite - - - - -  Feeling bad or failure about yourself  - - - - -  Trouble concentrating - - - - -  Moving slowly or fidgety/restless - - - - -  Suicidal thoughts - - - - -  PHQ-9 Score - - - - -   Fall Risk  01/30/2018 10/03/2017 06/04/2017 11/21/2016 07/18/2016  Falls in the past year? No No No No No  Number falls in past yr: - - - - -        Assessment & Plan:   1. Essential hypertension, benign   2. Pure hypercholesterolemia   3. Left leg swelling   4. Thyroid nodule   5. Parathyroid adenoma   6. DDD (degenerative disc disease), cervical   7. Primary hyperparathyroidism ([place])   8. NASH (nonalcoholic steatohepatitis)   9. Class 2 severe obesity due to excess calories with serious comorbidity and body mass index (BMI) of 39.0 to 39.9 in adult [facility])     Acute left lower extremity swelling and postoperative.:  New onset.  Send for stat Doppler today that was negative.  Likely secondary to over IV hydration due to emergency surgery and may followed by sedentary lifestyle and humidity with heat.  Obtain labs including CBC, c-Met, urinalysis.  Recommend compression stockings.  Hypertension: Well-controlled on current regimen.  Obtain labs for chronic disease management.  No changes to therapy.  Primary hyperparathyroidism with parathyroid adenoma and thyroid nodule: Status post parathyroidectomy and partial thyroid lobectomy on right.  Benign pathology.  Suffered with postoperative bleeding warranting emergency surgery.  Doing well postoperatively however did suffer with wound infection.  Now maintained on levothyroxine therapy.  Will follow-up with endocrinology in the upcoming month.  Degenerative disc disease of cervical spine: Stable.  Followed by orthopedics.  [place] and obesity with BMI of 39:  Recommend weight loss, exercise for 30-60 minutes five days per week;  recommend 1200 kcal restriction per day with a minimum of 60 grams of protein per day.  Eat 3 meals per day. Do not skip meals. Consider having a protein shake as a meal replacement to aid with eliminating meal skipping. Look for products with <220 calories, <7 gm sugar, and 20-30 gm protein.  Eat breakfast within 2 hours of getting up.   Make  your plate non-starchy vegetables,  protein, and  carbohydrates at lunch and dinner.   Aim for at least 64 oz. of calorie-free beverages daily (water, Crystal Light, diet green tea, etc.). Eliminate any sugary beverages such as regular soda, sweet tea, or fruit juice.   Pay attention to hunger and fullness cues.  Stop eating once you feel satisfied; don't wait until you feel full, stuffed, or sick from eating.  Choose lean meats and low fat/fat free dairy products.  Choose foods high in fiber such as fruits, vegetables, and whole grains (brown rice, whole wheat pasta, whole wheat bread, etc.).  Limit foods with added sugar to <7 gm per serving.  Always eat in the kitchen/dining room.  Never eat in the bedroom or in front of the TV.    Orders Placed This Encounter  Procedures  . CBC with Differential/Platelet  . Comprehensive metabolic panel  . POCT urinalysis dipstick   Meds ordered this encounter  Medications  . tiZANidine (ZANAFLEX) 4 MG tablet    Sig: Take 1 tablet (4 mg total) by mouth every 6 (six) hours as needed for muscle spasms.    Dispense:  60 tablet    Refill:  2  . amLODipine (NORVASC) 10 MG tablet    Sig: Take 1 tablet (10 mg total) by mouth daily.    Dispense:  90 tablet    Refill:  3  . losartan (COZAAR) 100 MG tablet    Sig: Take 1 tablet (100 mg total) by mouth daily.    Dispense:  90 tablet    Refill:  3  . montelukast (SINGULAIR) 10 MG tablet    Sig: Take 1 tablet (10 mg total) by mouth at bedtime.    Dispense:  90 tablet    Refill:  3  . propranolol (INDERAL) 20 MG tablet    Sig: Take 3 tablets (60 mg  total) by mouth 2 (two) times daily.    Dispense:  540 tablet    Refill:  3    Return in about 4 months (around 06/02/2018) for complete physical examiniation [place].   Kristi Elayne Guerin, M.D. Primary Care at [facility] [address] [place], [place]  [address] [phone] phone [phone] fax

## 2018-01-30 NOTE — Patient Instructions (Addendum)
[facility], go into the admissions and let them know you are there for a Doppler.      IF you received an x-ray today, you will receive an invoice from [facility]. Please contact [facility] at [phone] with questions or concerns regarding your invoice.   IF you received labwork today, you will receive an invoice from [facility]. Please contact [facility] at [phone] with questions or concerns regarding your invoice.   Our billing staff will not be able to assist you with questions regarding bills from these companies.  You will be contacted with the lab results as soon as they are available. The fastest way to get your results is to activate your My Chart account. Instructions are located on the last page of this paperwork. If you have not heard from [place] regarding the results in 2 weeks, please contact this office.

## 2018-01-31 DIAGNOSIS — K7581 Nonalcoholic steatohepatitis (NASH): Secondary | ICD-10-CM | POA: Insufficient documentation

## 2018-01-31 DIAGNOSIS — E210 Primary hyperparathyroidism: Secondary | ICD-10-CM | POA: Insufficient documentation

## 2018-01-31 LAB — CBC WITH DIFFERENTIAL/PLATELET
Basophils Absolute: 0 10*3/uL (ref 0.0–0.2)
Basos: 1 %
EOS (ABSOLUTE): 0.2 10*3/uL (ref 0.0–0.4)
Eos: 3 %
Hematocrit: 35.9 % (ref 34.0–46.6)
Hemoglobin: 11.7 g/dL (ref 11.1–15.9)
Immature Grans (Abs): 0 10*3/uL (ref 0.0–0.1)
Immature Granulocytes: 0 %
Lymphocytes Absolute: 1.7 10*3/uL (ref 0.7–3.1)
Lymphs: 34 %
MCH: 28.1 pg (ref 26.6–33.0)
MCHC: 32.6 g/dL (ref 31.5–35.7)
MCV: 86 fL (ref 79–97)
Monocytes Absolute: 0.5 10*3/uL (ref 0.1–0.9)
Monocytes: 9 %
Neutrophils Absolute: 2.6 10*3/uL (ref 1.4–7.0)
Neutrophils: 53 %
Platelets: 166 10*3/uL (ref 150–450)
RBC: 4.17 x10E6/uL (ref 3.77–5.28)
RDW: 13.1 % (ref 12.3–15.4)
WBC: 4.9 10*3/uL (ref 3.4–10.8)

## 2018-01-31 LAB — COMPREHENSIVE METABOLIC PANEL
ALT: 26 IU/L (ref 0–32)
AST: 24 IU/L (ref 0–40)
Albumin/Globulin Ratio: 1.8 (ref 1.2–2.2)
Albumin: 4.4 g/dL (ref 3.5–5.5)
Alkaline Phosphatase: 76 IU/L (ref 39–117)
BUN/Creatinine Ratio: 17 (ref 9–23)
BUN: 13 mg/dL (ref 6–24)
Bilirubin Total: 0.4 mg/dL (ref 0.0–1.2)
CO2: 27 mmol/L (ref 20–29)
Calcium: 10 mg/dL (ref 8.7–10.2)
Chloride: 102 mmol/L (ref 96–106)
Creatinine, Ser: 0.76 mg/dL (ref 0.57–1.00)
GFR calc Af Amer: 101 mL/min/{1.73_m2} (ref >59)
GFR calc non Af Amer: 88 mL/min/{1.73_m2} (ref >59)
Globulin, Total: 2.4 g/dL (ref 1.5–4.5)
Glucose: 95 mg/dL (ref 65–99)
Potassium: 4.8 mmol/L (ref 3.5–5.2)
Sodium: 141 mmol/L (ref 134–144)
Total Protein: 6.8 g/dL (ref 6.0–8.5)

## 2018-02-04 ENCOUNTER — Ambulatory Visit: Payer: 59 | Admitting: Family Medicine

## 2018-02-06 ENCOUNTER — Encounter: Payer: Self-pay | Admitting: Family Medicine

## 2018-02-13 ENCOUNTER — Encounter: Payer: Self-pay | Admitting: Family Medicine

## 2018-02-19 ENCOUNTER — Other Ambulatory Visit: Payer: Self-pay | Admitting: Gastroenterology

## 2018-02-19 DIAGNOSIS — K7400 Hepatic fibrosis, unspecified: Secondary | ICD-10-CM

## 2018-02-19 DIAGNOSIS — K760 Fatty (change of) liver, not elsewhere classified: Secondary | ICD-10-CM

## 2018-02-19 DIAGNOSIS — R748 Abnormal levels of other serum enzymes: Secondary | ICD-10-CM

## 2018-02-19 DIAGNOSIS — R1084 Generalized abdominal pain: Secondary | ICD-10-CM

## 2018-02-27 ENCOUNTER — Ambulatory Visit: Payer: 59

## 2018-03-14 ENCOUNTER — Ambulatory Visit
Admission: RE | Admit: 2018-03-14 | Discharge: 2018-03-14 | Disposition: A | Discharge status: Home / Self Care | Payer: 59 | Source: Ambulatory Visit | Attending: Gastroenterology | Admitting: Gastroenterology

## 2018-03-14 DIAGNOSIS — Z9049 Acquired absence of other specified parts of digestive tract: Secondary | ICD-10-CM | POA: Insufficient documentation

## 2018-03-14 DIAGNOSIS — K7400 Hepatic fibrosis, unspecified: Secondary | ICD-10-CM

## 2018-03-14 DIAGNOSIS — K760 Fatty (change of) liver, not elsewhere classified: Secondary | ICD-10-CM | POA: Diagnosis present

## 2018-03-14 DIAGNOSIS — R1084 Generalized abdominal pain: Secondary | ICD-10-CM

## 2018-04-22 ENCOUNTER — Ambulatory Visit (INDEPENDENT_AMBULATORY_CARE_PROVIDER_SITE_OTHER): Payer: 59 | Admitting: Internal Medicine

## 2018-04-22 ENCOUNTER — Encounter: Payer: Self-pay | Admitting: Internal Medicine

## 2018-04-22 VITALS — BP 120/70 | HR 54 | Ht 62.0 in | Wt 229.4 lb

## 2018-04-22 DIAGNOSIS — G4761 Periodic limb movement disorder: Secondary | ICD-10-CM | POA: Diagnosis not present

## 2018-04-22 DIAGNOSIS — J449 Chronic obstructive pulmonary disease, unspecified: Secondary | ICD-10-CM

## 2018-04-22 DIAGNOSIS — F5101 Primary insomnia: Secondary | ICD-10-CM

## 2018-04-22 MED ORDER — PROAIR HFA 108 (90 BASE) MCG/ACT IN AERS: INHALATION_SPRAY | RESPIRATORY_TRACT | 12 refills | Status: DC

## 2018-04-22 NOTE — Assessment & Plan Note (Signed)
Clonazepam for insomnia carried over too long. Plan-for long drive suggest caffeine tablet as discussed.

## 2018-04-22 NOTE — Assessment & Plan Note (Signed)
She does not recognize this as disturbing enough now that she wants to add therapy.

## 2018-04-22 NOTE — Progress Notes (Signed)
HPI  female former smoker followed for nocturnal hypoxia/minimal OSA, history Narcolepsy Sleep Study 2016 - Dr Rexene Alberts. History of Narcolepsy - diagnosed 2001- tried and failed Ritalin and Provigil. NPSG  11/30/14- [facility] AHI 5.3/ hr, desat to 83%, weight 216 lbs Study in 2001 in [place], [place], with report no longer available, included MSLT and she says diagnosed narcolepsy without sleep apnea. The 2016 sleep study was supposed to have included an MST but that didn't get done and she didn't like her experience there. Takes Zanaflex for cervical radiculopathy pain and it helps her sleep .Previous trials of Provigil and Ritalin both said to cause HBP and "swelling". She ended up on Dexedrine for a while. Tried Mirapex for vaguely defined limb jerks. This caused a lot of malaise and nausea. NPSG 10/19/2015-AHI 2.4 per hour/WNL, desaturation to 81%, moderate snoring, severe PLMS with arousal 6.3/hour MSLT- 10/20/2015-mean latency 3 minutes 43 seconds, 0/5 SOREM    C/W idiopathic hypersomnia History of recurrent bronchitis and chronic cough until she quit smoking. Several previous pneumonias .PFT 12/08/2015-moderate obstructive airways disease, insignificant response to bronchodilator, normal total lung capacity. They could not get an accurate diffusion capacity ------------------------------------------------------------------------------------------------------  04/20/17- 57 year old female former smoker followed for COPD, nocturnal hypoxia/ minimal OSA, history narcolepsy Follows for: DOE and Hypersomnolence;  Stopped clonazepam saying it made her too sleepy. Got new adjustable bed and sleeps in a room separate from her husband-sleeping better. Husband tells her she is not coughing or snoring now. Has no inhalers. Singulair is sufficient. Has zyrtec for allergic rhinitis. CXR 07/18/16 IMPRESSION: Chronic bronchitic changes. No pneumonia, CHF, nor other acute cardiopulmonary  abnormality.  04/22/2018- 57 year old female former smoker followed for COPD, nocturnal hypoxia/ minimal OSA, history narcolepsy, insomnia, GERD/Barrett's, Follows for: COPD Pro-air HFA, Zyrtec, Singulair Surgery at [facility] for parathyroidectomy/partial thyroidectomy, complicated by injury to vocal cord and hematoma.  All this has finally resolved.  She sleeps on an adjustable bed with head elevated, and her own room.  Aware of some restlessness.  Some dyspnea on exertion with little wheeze or cough.  Swallows without problem.  Less daytime sleepiness now that she is on Synthroid. Needs albuterol refilled.  Had flu shot. CXR 04/20/2017 IMPRESSION: Chronic bronchitic changes.  No active cardiopulmonary disease.  ROS-see HPI  + = positive Constitutional:    weight loss, night sweats, fevers, chills, + fatigue, lassitude. HEENT:    headaches, difficulty swallowing, tooth/dental problems, sore throat,       sneezing, itching, ear ache, nasal congestion, post nasal drip, snoring CV:    chest pain, orthopnea, PND, swelling in lower extremities, anasarca,                                                     dizziness, palpitations Resp:   shortness of breath with exertion or at rest.                productive cough,   non-productive cough, coughing up of blood.              change in color of mucus.  wheezing.   Skin:    rash or lesions. GI:  No-   heartburn, indigestion, abdominal pain, nausea, vomiting, diarrhea,                 change in bowel habits, loss of appetite  GU: dysuria, change in color of urine, no urgency or frequency.   flank pain. MS:   joint pain, stiffness, decreased range of motion, + back pain. Neuro-     nothing unusual Psych:  change in mood or affect.  depression or anxiety.   memory loss.  OBJ- Physical Exam General- Alert, Oriented, Affect-appropriate, Distress- none acute, + overweight with thick neck Skin- rash-none, lesions- none, excoriation- none Lymphadenopathy-  none Head- atraumatic            Eyes- Gross vision intact, PERRLA, conjunctivae and secretions clear            Ears- Hearing, canals-normal            Nose- Clear, no-Septal dev, mucus, polyps, erosion, perforation             Throat- Mallampati III , mucosa clear , drainage- none, tonsils- atrophic Neck- flexible , trachea midline, no stridor , thyroid nl, carotid no bruit Chest - symmetrical excursion , unlabored           Heart/CV- RRR , no murmur , no gallop  , no rub, nl s1 s2                           - JVD- none , edema- none, stasis changes- none, varices- none           Lung- clear to P&A, wheeze- none, cough- none , dullness-none, rub- none           Chest wall-  Abd-  Br/ Gen/ Rectal- Not done, not indicated Extrem- cyanosis- none, clubbing, none, atrophy- none, strength- nl Neuro- grossly intact to observation

## 2018-04-22 NOTE — Patient Instructions (Signed)
Refill script sent for Proair albuterol inhaler to use if needed  Please call if we can help

## 2018-04-22 NOTE — Assessment & Plan Note (Signed)
Minimal symptoms.  Exertional dyspnea also relates to her overweight and deconditioning. Plan-refill Pro Air HFA.  No need for maintenance controller yet.

## 2018-05-10 ENCOUNTER — Other Ambulatory Visit: Payer: Self-pay | Admitting: Family Medicine

## 2018-05-21 ENCOUNTER — Observation Stay
Admission: EM | Admit: 2018-05-21 | Discharge: 2018-05-23 | Disposition: A | Discharge status: Home / Self Care | Payer: 59 | Attending: Internal Medicine | Admitting: Internal Medicine

## 2018-05-21 ENCOUNTER — Other Ambulatory Visit
Admission: RE | Admit: 2018-05-21 | Discharge: 2018-05-21 | Disposition: A | Discharge status: Home / Self Care | Payer: 59 | Source: Ambulatory Visit | Attending: Gastroenterology | Admitting: Gastroenterology

## 2018-05-21 ENCOUNTER — Other Ambulatory Visit: Payer: Self-pay

## 2018-05-21 DIAGNOSIS — K219 Gastro-esophageal reflux disease without esophagitis: Secondary | ICD-10-CM | POA: Insufficient documentation

## 2018-05-21 DIAGNOSIS — N200 Calculus of kidney: Secondary | ICD-10-CM | POA: Insufficient documentation

## 2018-05-21 DIAGNOSIS — K529 Noninfective gastroenteritis and colitis, unspecified: Principal | ICD-10-CM | POA: Insufficient documentation

## 2018-05-21 DIAGNOSIS — Z6841 Body Mass Index (BMI) 40.0 and over, adult: Secondary | ICD-10-CM | POA: Insufficient documentation

## 2018-05-21 DIAGNOSIS — M5030 Other cervical disc degeneration, unspecified cervical region: Secondary | ICD-10-CM | POA: Diagnosis not present

## 2018-05-21 DIAGNOSIS — G4700 Insomnia, unspecified: Secondary | ICD-10-CM | POA: Insufficient documentation

## 2018-05-21 DIAGNOSIS — K7581 Nonalcoholic steatohepatitis (NASH): Secondary | ICD-10-CM | POA: Insufficient documentation

## 2018-05-21 DIAGNOSIS — K5730 Diverticulosis of large intestine without perforation or abscess without bleeding: Secondary | ICD-10-CM | POA: Diagnosis not present

## 2018-05-21 DIAGNOSIS — E7800 Pure hypercholesterolemia, unspecified: Secondary | ICD-10-CM | POA: Insufficient documentation

## 2018-05-21 DIAGNOSIS — J449 Chronic obstructive pulmonary disease, unspecified: Secondary | ICD-10-CM | POA: Insufficient documentation

## 2018-05-21 DIAGNOSIS — Z888 Allergy status to other drugs, medicaments and biological substances status: Secondary | ICD-10-CM | POA: Insufficient documentation

## 2018-05-21 DIAGNOSIS — K625 Hemorrhage of anus and rectum: Secondary | ICD-10-CM | POA: Insufficient documentation

## 2018-05-21 DIAGNOSIS — Z79899 Other long term (current) drug therapy: Secondary | ICD-10-CM | POA: Insufficient documentation

## 2018-05-21 DIAGNOSIS — E210 Primary hyperparathyroidism: Secondary | ICD-10-CM | POA: Diagnosis not present

## 2018-05-21 DIAGNOSIS — Z87891 Personal history of nicotine dependence: Secondary | ICD-10-CM | POA: Insufficient documentation

## 2018-05-21 DIAGNOSIS — M797 Fibromyalgia: Secondary | ICD-10-CM | POA: Insufficient documentation

## 2018-05-21 DIAGNOSIS — K644 Residual hemorrhoidal skin tags: Secondary | ICD-10-CM | POA: Diagnosis present

## 2018-05-21 DIAGNOSIS — I10 Essential (primary) hypertension: Secondary | ICD-10-CM | POA: Diagnosis not present

## 2018-05-21 DIAGNOSIS — E039 Hypothyroidism, unspecified: Secondary | ICD-10-CM | POA: Diagnosis not present

## 2018-05-21 DIAGNOSIS — K922 Gastrointestinal hemorrhage, unspecified: Secondary | ICD-10-CM | POA: Diagnosis present

## 2018-05-21 DIAGNOSIS — Z886 Allergy status to analgesic agent status: Secondary | ICD-10-CM | POA: Insufficient documentation

## 2018-05-21 DIAGNOSIS — F329 Major depressive disorder, single episode, unspecified: Secondary | ICD-10-CM | POA: Insufficient documentation

## 2018-05-21 DIAGNOSIS — K5790 Diverticulosis of intestine, part unspecified, without perforation or abscess without bleeding: Secondary | ICD-10-CM | POA: Diagnosis present

## 2018-05-21 LAB — COMPREHENSIVE METABOLIC PANEL
ALT: 31 U/L (ref 0–44)
AST: 30 U/L (ref 15–41)
Albumin: 4.6 g/dL (ref 3.5–5.0)
Alkaline Phosphatase: 62 U/L (ref 38–126)
Anion gap: 8 (ref 5–15)
BUN: 16 mg/dL (ref 6–20)
CO2: 28 mmol/L (ref 22–32)
Calcium: 9.4 mg/dL (ref 8.9–10.3)
Chloride: 104 mmol/L (ref 98–111)
Creatinine, Ser: 0.73 mg/dL (ref 0.44–1.00)
GFR calc Af Amer: >60 mL/min (ref >60)
GFR calc non Af Amer: >60 mL/min (ref >60)
Glucose, Bld: 119 mg/dL — ABNORMAL HIGH (ref 70–99)
Potassium: 3.9 mmol/L (ref 3.5–5.1)
Sodium: 140 mmol/L (ref 135–145)
Total Bilirubin: 0.9 mg/dL (ref 0.3–1.2)
Total Protein: 7.3 g/dL (ref 6.5–8.1)

## 2018-05-21 LAB — CBC
HCT: 38.1 % (ref 36.0–46.0)
Hemoglobin: 12.7 g/dL (ref 12.0–15.0)
MCH: 29.3 pg (ref 26.0–34.0)
MCHC: 33.3 g/dL (ref 30.0–36.0)
MCV: 87.8 fL (ref 80.0–100.0)
Platelets: 174 10*3/uL (ref 150–400)
RBC: 4.34 MIL/uL (ref 3.87–5.11)
RDW: 12.9 % (ref 11.5–15.5)
WBC: 5.4 10*3/uL (ref 4.0–10.5)
nRBC: 0 % (ref 0.0–0.2)

## 2018-05-21 LAB — GASTROINTESTINAL PANEL BY PCR, STOOL (REPLACES STOOL CULTURE)

## 2018-05-21 LAB — C DIFFICILE QUICK SCREEN W PCR REFLEX
C Diff antigen: NEGATIVE
C Diff interpretation: NOT DETECTED
C Diff toxin: NEGATIVE

## 2018-05-21 LAB — TYPE AND SCREEN
ABO/RH(D): A POS
Antibody Screen: NEGATIVE

## 2018-05-21 LAB — HEMOGLOBIN: Hemoglobin: 12.4 g/dL (ref 12.0–15.0)

## 2018-05-21 MED ORDER — MONTELUKAST SODIUM 10 MG PO TABS
10.0000 mg | ORAL_TABLET | Freq: Every day | ORAL | Status: DC
  Administered 2018-05-21 – 2018-05-22 (×2): 10 mg via ORAL
  Filled 2018-05-21 (×2): qty 1

## 2018-05-21 MED ORDER — TIZANIDINE HCL 4 MG PO TABS
4.0000 mg | ORAL_TABLET | Freq: Four times a day (QID) | ORAL | Status: DC | PRN
  Administered 2018-05-21: 23:00:00 4 mg via ORAL
  Filled 2018-05-21 (×3): qty 1

## 2018-05-21 MED ORDER — PANTOPRAZOLE SODIUM 40 MG PO TBEC
40.0000 mg | DELAYED_RELEASE_TABLET | Freq: Every day | ORAL | Status: DC
  Administered 2018-05-22 – 2018-05-23 (×2): 40 mg via ORAL
  Filled 2018-05-21 (×2): qty 1

## 2018-05-21 MED ORDER — LEVOTHYROXINE SODIUM 50 MCG PO TABS
75.0000 ug | ORAL_TABLET | Freq: Every day | ORAL | Status: DC
  Administered 2018-05-22 – 2018-05-23 (×2): 75 ug via ORAL
  Filled 2018-05-21 (×2): qty 1

## 2018-05-21 MED ORDER — ACETAMINOPHEN 650 MG RE SUPP: 650.0000 mg | Freq: Four times a day (QID) | RECTAL | Status: DC | PRN

## 2018-05-21 MED ORDER — LOSARTAN POTASSIUM 50 MG PO TABS
100.0000 mg | ORAL_TABLET | Freq: Every day | ORAL | Status: DC
  Administered 2018-05-21 – 2018-05-22 (×2): 100 mg via ORAL
  Filled 2018-05-21 (×2): qty 2

## 2018-05-21 MED ORDER — HYOSCYAMINE SULFATE 0.125 MG PO TBDP
0.1250 mg | ORAL_TABLET | Freq: Four times a day (QID) | ORAL | Status: DC | PRN
  Filled 2018-05-21: qty 1

## 2018-05-21 MED ORDER — PROPRANOLOL HCL 40 MG PO TABS
60.0000 mg | ORAL_TABLET | Freq: Two times a day (BID) | ORAL | Status: DC
  Administered 2018-05-21 – 2018-05-23 (×4): 60 mg via ORAL
  Filled 2018-05-21 (×5): qty 1

## 2018-05-21 MED ORDER — ACETAMINOPHEN 325 MG PO TABS: 650.0000 mg | ORAL_TABLET | Freq: Four times a day (QID) | ORAL | Status: DC | PRN

## 2018-05-21 MED ORDER — LORATADINE 10 MG PO TABS
10.0000 mg | ORAL_TABLET | Freq: Every day | ORAL | Status: DC
  Administered 2018-05-21 – 2018-05-22 (×2): 10 mg via ORAL
  Filled 2018-05-21 (×2): qty 1

## 2018-05-21 MED ORDER — ALBUTEROL SULFATE (2.5 MG/3ML) 0.083% IN NEBU: 3.0000 mL | INHALATION_SOLUTION | RESPIRATORY_TRACT | Status: DC | PRN

## 2018-05-21 MED ORDER — AMLODIPINE BESYLATE 5 MG PO TABS
5.0000 mg | ORAL_TABLET | Freq: Every day | ORAL | Status: DC
  Administered 2018-05-22 – 2018-05-23 (×2): 5 mg via ORAL
  Filled 2018-05-21 (×2): qty 1

## 2018-05-21 NOTE — Care Management Obs Status (Signed)
[place] NOTIFICATION   Patient Details  Name: Marciel Offenberger MRN: [id number] Date of Birth: [birth year]   Medicare Observation Status Notification Given:  Yes    Shelbie Hutching, RN 05/21/2018, 3:42 PM

## 2018-05-21 NOTE — ED Notes (Signed)
Pt c/o vomiting that began after eating on Friday and occurred again on Monday. Denies N/V at this time.

## 2018-05-21 NOTE — ED Triage Notes (Signed)
Pt sent from [facility] with GI bleeding since yesterday. States she was seen at GI today and had some labs drawn and had a large bloody stool while at the office and is orthostatic per Roberta. Pt is a/ox4 on arrival, pale is color with warm dry skin.

## 2018-05-21 NOTE — ED Provider Notes (Signed)
[facility] Emergency Department Provider Note  ____________________________________________  Time seen: Approximately 1:27 PM  I have reviewed the triage vital signs and the nursing notes.   HISTORY  Chief Complaint GI Bleeding    HPI Emily Melton is a 58 y.o. female with a history of hypertension fibromyalgia depression GERD and diverticulosis  who complains of diffuse abdominal cramping pain for the past 4 days, waxing awaited, nonradiating, mild to moderate intensity, no aggravating or alleviating factors, associated with multiple large bloody bowel movements in the past 24 hours.    This all started after eating some hot dogs 4 days ago.Wilburn Mylar she had 4 bloody bowel movements.  This morning it seemed to decrease, but then while in the outpatient clinic getting labs done she had another large bloody bowel movement and felt lightheaded like she is can a pass out, and came to the ED for evaluation.  She does not take blood thinners.  She has a history of hemorrhoids as well as diverticulosis.  Last colonoscopy by Dr. Gustavo Lah a year ago with multiple polyps resected.     Past Medical History:  Diagnosis Date  . Allergic rhinitis, cause unspecified   . Allergy   . Anemia   . Arthritis    C-spine; 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 Barrett's esophagus   . Cervical radiculopathy   . Chicken pox    childhood  . Depression   . Diaphragmatic hernia without mention of obstruction or gangrene   . Fibromyalgia   . Fibromyalgia   . GERD (gastroesophageal reflux disease)   . HTN (hypertension)   . Insomnia, unspecified   . Migraine, unspecified, without mention of intractable migraine without mention of status migrainosus   . Narcolepsy    w/o cataplexy  . Nonspecific abnormal electrocardiogram (ECG) (EKG)   . Osteoporosis   . Other abnormal glucose   . Periodic limb movement disorder   . Personal history of  colonic polyps   . Postlaminectomy syndrome of cervical region   . Tobacco use disorder   . Unspecified vitamin D deficiency      Patient Active Problem List   Diagnosis Date Noted  . NASH (nonalcoholic steatohepatitis) 01/31/2018  . Primary hyperparathyroidism ([place]) 01/31/2018  . Status post arthroscopy of right knee 08/28/2016  . Periodic limb movement sleep disorder 12/08/2015  . COPD mixed type ([place]) 08/13/2015  . Pure hypercholesterolemia 06/30/2013  . Glucose intolerance (impaired glucose tolerance) 06/30/2013  . DDD (degenerative disc disease), cervical 06/30/2013  . Essential hypertension, benign 11/17/2012  . Insomnia 11/17/2012  . ELECTROCARDIOGRAM, ABNORMAL 09/22/2010     Past Surgical History:  Procedure Laterality Date  . ABDOMINAL HYSTERECTOMY     fibroids and endometriosis.  ovarian resection R.  . BACK SURGERY    . BREAST EXCISIONAL BIOPSY  2005    R   Benign  . BREAST SURGERY    . carpal tunnel release - bilateral    . CESAREAN SECTION     x2  1984 and 1986  . CHOLECYSTECTOMY    . 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 COLONOSCOPY  03/17/2012   [facility]  . COLONOSCOPY WITH PROPOFOL N/A 12/08/2016   Procedure: COLONOSCOPY WITH PROPOFOL;  Surgeon: Lollie Sails, MD;  Location: [facility] ENDOSCOPY;  Service: Endoscopy;  Laterality: N/A;  . DIAGNOSTIC LAPAROSCOPY    . DILATION AND CURETTAGE OF UTERUS     SAB  .  ESOPHAGOGASTRODUODENOSCOPY  03/17/2012   no Barrett's esophagus. [facility].  . fusion of cervical spine  2007   C5,C6,C7, and T1  . KNEE SURGERY    . laprascopy  2009   for RLQ pain:R oophorectomy performed no improvement in pain.   . pilonidal cyst resection  1992  . R shoulder surgery  06/24/09   2 tears in rotator cuff,bone spurs. Dr. Modena Morrow  . SHOULDER ARTHROSCOPY Right 2010  . SPINE SURGERY  09/2011   Cervical spine fusion  . TUBAL LIGATION    . tubel ligation  2002  . VAGINAL HYSTERECTOMY  2009   fibroid/endometriosis, one L  ovary remaining     Prior to Admission medications   Medication Sig Start Date End Date Taking? Authorizing Provider  amLODipine (NORVASC) 10 MG tablet Take 1 tablet (10 mg total) by mouth daily. 01/30/18   Wardell Honour, MD  cetirizine (ZYRTEC) 10 MG tablet Take 10 mg by mouth daily.      [provider]  Dexlansoprazole (DEXILANT) 30 MG capsule Take 30 mg by mouth daily.    [provider]  HYDROcodone-acetaminophen (NORCO/VICODIN) 5-325 MG tablet Take 1 tablet by mouth every 6 (six) hours as needed for moderate pain. 10/03/17   Wardell Honour, MD  hyoscyamine (ANASPAZ) 0.125 MG TBDP Place 0.125-0.25 mg under the tongue every 6 (six) hours as needed for cramping. For stomach cramps    [provider]  levothyroxine (SYNTHROID, LEVOTHROID) 50 MCG tablet Take 50 mcg by mouth daily before breakfast.    [provider]  losartan (COZAAR) 100 MG tablet Take 1 tablet (100 mg total) by mouth daily. 01/30/18   Wardell Honour, MD  montelukast (SINGULAIR) 10 MG tablet Take 1 tablet (10 mg total) by mouth at bedtime. 01/30/18   Wardell Honour, MD  PROAIR HFA 108 [phone] Base) MCG/ACT inhaler inhale 2 puffs by mouth every 4 to 6 hours if needed for BREATHING 04/22/18   Baird Lyons D, MD  propranolol (INDERAL) 20 MG tablet Take 3 tablets (60 mg total) by mouth 2 (two) times daily. 01/30/18   Wardell Honour, MD  tiZANidine (ZANAFLEX) 4 MG tablet Take 1 tablet (4 mg total) by mouth every 6 (six) hours as needed for muscle spasms. 01/30/18   Wardell Honour, MD     Allergies Duloxetine hcl; Mirtazapine; Serotonin reuptake inhibitors (ssris); St johns wort; Bupropion hcl; Celebrex [celecoxib]; Effexor [venlafaxine]; Gralise [gabapentin (once-daily)]; Lyrica [pregabalin]; Methylphenidate hcl; Mirapex [pramipexole]; Modafinil; Rabeprazole sodium; Savella [milnacipran hcl]; Zegerid [omeprazole]; and Lisinopril   Family History  Problem Relation Age of Onset  . Bipolar  disorder Mother   . Cancer Mother        bladder  . Hypertension Mother   . Colon polyps Mother   . Osteoarthritis Mother   . Diabetes Mother   . Thyroid disease Mother   . Heart disease Father 68       CAD/AMI with stenting/PAD.  Marland Kitchen Hyperlipidemia Father   . Cancer Father        skin  . Angina Father   . Asthma Daughter   . Cancer Maternal Grandmother        colon  . Heart disease Maternal Grandmother   . Cancer Maternal Grandfather        lung  . Parkinson's disease Maternal Grandfather   . Stroke Maternal Grandfather   . Diabetes Maternal Grandfather   . Hyperlipidemia Maternal Grandfather   . Rheum arthritis Paternal Grandmother   .  Heart attack Paternal Grandfather   . Pulmonary fibrosis Paternal Aunt   . Pulmonary fibrosis Paternal Uncle     Social History Social History   Tobacco Use  . Smoking status: Former Smoker    Packs/day: 1.00    Years: 36.00    Pack years: 36.00    Types: Cigarettes    Last attempt to quit: 11/28/2013    Years since quitting: 4.4  . Smokeless tobacco: Former Systems developer    Quit date: 11/19/2013  . Tobacco comment: smoked 1 ppd 30-35 years; quit in 2011  Substance Use Topics  . Alcohol use: No    Alcohol/week: 0.0 standard drinks    Comment: RARELY  . Drug use: No    Review of Systems  Constitutional:   No fever or chills.  ENT:   No sore throat. No rhinorrhea. Cardiovascular:   No chest pain or syncope. Respiratory:   No dyspnea or cough. Gastrointestinal: Positive for abdominal pain and bloody diarrhea.  No vomiting. Musculoskeletal:   Negative for focal pain or swelling All other systems reviewed and are negative except as documented above in ROS and HPI.  ____________________________________________   PHYSICAL EXAM:  VITAL SIGNS: ED Triage Vitals  Enc Vitals Group     BP 05/21/18 1051 104/62     Pulse Rate 05/21/18 1051 (!) 58     Resp 05/21/18 1051 18     Temp 05/21/18 1051 98.4 F (36.9 C)     Temp Source 05/21/18  1051 Oral     SpO2 05/21/18 1051 95 %     Weight 05/21/18 1041 223 lb (101.2 kg)     Height 05/21/18 1041 5' 2"  (1.575 m)     Head Circumference --      Peak Flow --      Pain Score 05/21/18 1041 3     Pain Loc --      Pain Edu? --      Excl. in [place]? --     Vital signs reviewed, nursing assessments reviewed.   Constitutional:   Alert and oriented. Non-toxic appearance. Eyes:   Conjunctivae are normal. EOMI. PERRL. ENT      Head:   Normocephalic and atraumatic.      Nose:   No congestion/rhinnorhea.       Mouth/Throat:   MMM, no pharyngeal erythema. No peritonsillar mass.       Neck:   No meningismus. Full ROM. Hematological/Lymphatic/Immunilogical:   No cervical lymphadenopathy. Cardiovascular:   RRR. Symmetric bilateral radial and DP pulses.  No murmurs. Cap refill less than 2 seconds. Respiratory:   Normal respiratory effort without tachypnea/retractions. Breath sounds are clear and equal bilaterally. No wheezes/rales/rhonchi. Gastrointestinal:   Soft with mild diffuse tenderness, nonfocal.. Non distended. There is no CVA tenderness.  No rebound, rigidity, or guarding.  Rectal exam performed with nurse Christina bedside, shows gross blood in the rectum.  There is a large external hemorrhoid which is not inflamed or bleeding.  Musculoskeletal:   Normal range of motion in all extremities. No joint effusions.  No lower extremity tenderness.  No edema. Neurologic:   Normal speech and language.  Motor grossly intact. No acute focal neurologic deficits are appreciated.  Skin:    Skin is warm, dry and intact. No rash noted.  No petechiae, purpura, or bullae.  ____________________________________________    LABS (pertinent positives/negatives) (all labs ordered are listed, but only abnormal results are displayed) Labs Reviewed  COMPREHENSIVE METABOLIC PANEL - Abnormal; Notable for the following components:  Result Value   Glucose, Bld 119 (*)    All other components within  normal limits  CBC  POC OCCULT BLOOD, ED  TYPE AND SCREEN   ____________________________________________   EKG    ____________________________________________    RADIOLOGY  No results found.  ____________________________________________   PROCEDURES Procedures  ____________________________________________  DIFFERENTIAL DIAGNOSIS   Infectious colitis, diverticular bleed, hemorrhoidal bleed, diverticulitis.  Doubt intra-abdominal abscess or perforation.  Doubt appendicitis, AAA, dissection, mesenteric ischemia, or pancreatitis  CLINICAL IMPRESSION / ASSESSMENT AND PLAN / ED COURSE  Pertinent labs & imaging results that were available during my care of the patient were reviewed by me and considered in my medical decision making (see chart for details).    Patient presents with abdominal pain and bloody diarrhea, possibly related to recent ingestion of hot dogs.  More likely related to bleeding diverticulum.  Labs are normal, white blood cell count is normal, vital signs are normal and the patient is afebrile.  C. difficile panel negative.  GI pathogen panel still pending in the lab as sent from primary care clinic.  Hemoglobin stable   Plan to hospitalize for observation and further management of lower GI bleed and follow-up of GI panel.      ____________________________________________   FINAL CLINICAL IMPRESSION(S) / ED DIAGNOSES    Final diagnoses:  Diverticulosis  Lower GI bleed  External hemorrhoid     ED Discharge Orders    None      Portions of this note were generated with dragon dictation software. Dictation errors may occur despite best attempts at proofreading.    Carrie Mew, MD 05/21/18 1336

## 2018-05-21 NOTE — ED Notes (Signed)
Pt c/o bright/dark red blood in stool with clots

## 2018-05-21 NOTE — H&P (Addendum)
[facility] [place] NAME: Emily Melton    MR#:  [id number]  DATE OF BIRTH:  1961/06/19  DATE OF ADMISSION:  05/21/2018  PRIMARY CARE PHYSICIAN: Wardell Honour, MD   REQUESTING/REFERRING PHYSICIAN: Dr Carrie Mew  CHIEF COMPLAINT:   Chief Complaint  Patient presents with  . GI Bleeding    HISTORY OF PRESENT ILLNESS:  Emily Melton  is a 57 y.o. female states she ate a hot dog on Thursday around 4 PM and started vomiting around 10 PM.  Had some diarrhea also.  On Saturday Sunday she just felt weak and tired and spent most of the day Saturday in bed.  On Monday around 1 PM she had cramping abdominal discomfort and diarrhea with blood.  30 minutes later had another episode with blood.  Around 2:30 PM on Monday she had another episode.  4 PM and sort of settled down but had a small amount of blood.  Today she went to see the gastroenterology team with [facility] clinic and was sent into the office when she had another episode with blood.  Of note, she did have a colonoscopy last year in May which showed diverticulosis and multiple polyps that were removed.  Hospitalist services were contacted for further evaluation.  PAST MEDICAL HISTORY:   Past Medical History:  Diagnosis Date  . Allergic rhinitis, cause unspecified   . Allergy   . Anemia   . Arthritis    C-spine; 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 Barrett's esophagus   . Cervical radiculopathy   . Chicken pox    childhood  . Depression   . Diaphragmatic hernia without mention of obstruction or gangrene   . Fibromyalgia   . Fibromyalgia   . GERD (gastroesophageal reflux disease)   . HTN (hypertension)   . Insomnia, unspecified   . Migraine, unspecified, without mention of intractable migraine without mention of status migrainosus   . Narcolepsy    w/o cataplexy  . Nonspecific abnormal electrocardiogram (ECG) (EKG)   . Osteoporosis   . Other  abnormal glucose   . Periodic limb movement disorder   . Personal history of colonic polyps   . Postlaminectomy syndrome of cervical region   . Tobacco use disorder   . Unspecified vitamin D deficiency     PAST SURGICAL HISTORY:   Past Surgical History:  Procedure Laterality Date  . ABDOMINAL HYSTERECTOMY     fibroids and endometriosis.  ovarian resection R.  . BACK SURGERY    . BREAST EXCISIONAL BIOPSY  2005    R   Benign  . BREAST SURGERY    . carpal tunnel release - bilateral    . CESAREAN SECTION     x2  1984 and 1986  . CHOLECYSTECTOMY    . 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 COLONOSCOPY  03/17/2012   [facility]  . COLONOSCOPY WITH PROPOFOL N/A 12/08/2016   Procedure: COLONOSCOPY WITH PROPOFOL;  Surgeon: Lollie Sails, MD;  Location: [facility] ENDOSCOPY;  Service: Endoscopy;  Laterality: N/A;  . DIAGNOSTIC LAPAROSCOPY    . DILATION AND CURETTAGE OF UTERUS     SAB  . ESOPHAGOGASTRODUODENOSCOPY  03/17/2012   no Barrett's esophagus. [facility].  . fusion of cervical spine  2007   C5,C6,C7, and T1  . KNEE SURGERY    . laprascopy  2009   for RLQ pain:R oophorectomy performed no improvement in  pain.   . pilonidal cyst resection  1992  . R shoulder surgery  06/24/09   2 tears in rotator cuff,bone spurs. Dr. Modena Morrow  . SHOULDER ARTHROSCOPY Right 2010  . SPINE SURGERY  09/2011   Cervical spine fusion  . TUBAL LIGATION    . tubel ligation  2002  . VAGINAL HYSTERECTOMY  2009   fibroid/endometriosis, one L ovary remaining    SOCIAL HISTORY:   Social History   Tobacco Use  . Smoking status: Former Smoker    Packs/day: 1.00    Years: 36.00    Pack years: 36.00    Types: Cigarettes    Last attempt to quit: 11/28/2013    Years since quitting: 4.4  . Smokeless tobacco: Former Systems developer    Quit date: 11/19/2013  . Tobacco comment: smoked 1 ppd 30-35 years; quit in 2011  Substance Use Topics  . Alcohol use: No    Alcohol/week: 0.0 standard drinks    Comment: RARELY     FAMILY HISTORY:   Family History  Problem Relation Age of Onset  . Bipolar disorder Mother   . Cancer Mother        bladder  . Hypertension Mother   . Colon polyps Mother   . Osteoarthritis Mother   . Diabetes Mother   . Thyroid disease Mother   . Heart disease Father 1       CAD/AMI with stenting/PAD.  Marland Kitchen Hyperlipidemia Father   . Cancer Father        skin  . Angina Father   . Asthma Daughter   . Cancer Maternal Grandmother        colon  . Heart disease Maternal Grandmother   . Cancer Maternal Grandfather        lung  . Parkinson's disease Maternal Grandfather   . Stroke Maternal Grandfather   . Diabetes Maternal Grandfather   . Hyperlipidemia Maternal Grandfather   . Rheum arthritis Paternal Grandmother   . Heart attack Paternal Grandfather   . Pulmonary fibrosis Paternal Aunt   . Pulmonary fibrosis Paternal Uncle     DRUG ALLERGIES:   Allergies  Allergen Reactions  . Duloxetine Hcl Other (See Comments)    Hypertension, mood changes  . Mirtazapine Swelling  . Serotonin Reuptake Inhibitors (Ssris) Other (See Comments)    Hypertension, mood changes  . St Johns Wort Anaphylaxis  . Bupropion Hcl Other (See Comments)    Mental changes and high blood pressure   . Celebrex [Celecoxib] Hypertension  . Effexor [Venlafaxine] Other (See Comments)    Moodiness, vivid dreams, poor sleep cycle.  Doristine Bosworth [Gabapentin (Once-Daily)] Cough  . Lyrica [Pregabalin] Other (See Comments) and Hypertension    Mood changes, eye twitching and UTI  . Methylphenidate Hcl Other (See Comments)    unknown  . Mirapex [Pramipexole]     HTN, hallucinations and nausea  . Modafinil Swelling and Hypertension  . Rabeprazole Sodium Swelling and Other (See Comments)    Headaches   . Savella [Milnacipran Hcl] Other (See Comments)  . Zegerid [Omeprazole] Other (See Comments)  . Lisinopril Rash    Hypertension    REVIEW OF SYSTEMS:  CONSTITUTIONAL: No fever.positive for chills and  sweats on Thursday.  No fatigue or weakness.  EYES: No blurred or double vision.  Wears glasses. EARS, NOSE, AND THROAT: No tinnitus or ear pain. No sore throat RESPIRATORY: Some cough.no shortness of breath, wheezing or hemoptysis.  CARDIOVASCULAR: No chest pain, orthopnea, edema.  GASTROINTESTINAL: No nausea, vomiting currently  but did have on Thursday evening.  Some cramping abdominal pain.  Positive for blood in bowel movements GENITOURINARY: No dysuria, hematuria.  ENDOCRINE: No polyuria, nocturia,  HEMATOLOGY: No anemia, easy bruising or bleeding SKIN: No rash or lesion. MUSCULOSKELETAL: No joint pain or arthritis.   NEUROLOGIC: No tingling, numbness, weakness.  PSYCHIATRY: No anxiety or depression.   MEDICATIONS AT HOME:   Prior to Admission medications   Medication Sig Start Date End Date Taking? Authorizing Provider  amLODipine (NORVASC) 5 MG tablet Take 5 mg by mouth daily. 05/13/18  Yes [provider]  cetirizine (ZYRTEC) 10 MG tablet Take 10 mg by mouth at bedtime.    Yes [provider]  Dexlansoprazole (DEXILANT) 30 MG capsule Take 30 mg by mouth daily.   Yes [provider]  hyoscyamine (ANASPAZ) 0.125 MG TBDP Place 0.125-0.25 mg under the tongue every 6 (six) hours as needed for cramping.    Yes [provider]  levothyroxine (SYNTHROID, LEVOTHROID) 50 MCG tablet Take 75 mcg by mouth daily before breakfast.    Yes [provider]  losartan (COZAAR) 100 MG tablet Take 1 tablet (100 mg total) by mouth daily. Patient taking differently: Take 100 mg by mouth at bedtime.  01/30/18  Yes Wardell Honour, MD  montelukast (SINGULAIR) 10 MG tablet Take 1 tablet (10 mg total) by mouth at bedtime. 01/30/18  Yes Wardell Honour, MD  PROAIR HFA 108 [phone] Base) MCG/ACT inhaler inhale 2 puffs by mouth every 4 to 6 hours if needed for BREATHING Patient taking differently: Inhale 2 puffs into the lungs every 4 (four) hours as needed for wheezing or  shortness of breath.  04/22/18  Yes Young, Tarri Fuller D, MD  propranolol (INDERAL) 20 MG tablet Take 3 tablets (60 mg total) by mouth 2 (two) times daily. 01/30/18  Yes Wardell Honour, MD  tiZANidine (ZANAFLEX) 4 MG tablet Take 1 tablet (4 mg total) by mouth every 6 (six) hours as needed for muscle spasms. 01/30/18  Yes Wardell Honour, MD      VITAL SIGNS:  Blood pressure 129/78, pulse (!) 58, temperature 98.4 F (36.9 C), temperature source Oral, resp. rate 18, height 5' 2"  (1.575 m), weight 101.2 kg, SpO2 96 %.  PHYSICAL EXAMINATION:  GENERAL:  57 y.o.-year-old patient lying in the bed with no acute distress.  EYES: Pupils equal, round, reactive to light and accommodation. No scleral icterus. Extraocular muscles intact.  HEENT: Head atraumatic, normocephalic. Oropharynx and nasopharynx clear.  NECK:  Supple, no jugular venous distention. No thyroid enlargement, no tenderness.  LUNGS: Normal breath sounds bilaterally, no wheezing, rales,rhonchi or crepitation. No use of accessory muscles of respiration.  CARDIOVASCULAR: S1, S2 normal. No murmurs, rubs, or gallops.  ABDOMEN: Soft, nontender, nondistended. Bowel sounds present. No organomegaly or mass.  EXTREMITIES: Trace pedal edema, no cyanosis, or clubbing.  NEUROLOGIC: Cranial nerves II through XII are intact. Muscle strength 5/5 in all extremities. Sensation intact. Gait not checked.  PSYCHIATRIC: The patient is alert and oriented x 3.  SKIN: No rash, lesion, or ulcer.   LABORATORY PANEL:   CBC Recent Labs  Lab 05/21/18 1118  WBC 5.4  HGB 12.7  HCT 38.1  PLT 174   ------------------------------------------------------------------------------------------------------------------  Chemistries  Recent Labs  Lab 05/21/18 1118  NA 140  K 3.9  CL 104  CO2 28  GLUCOSE 119*  BUN 16  CREATININE 0.73  CALCIUM 9.4  AST 30  ALT 31  ALKPHOS 62  BILITOT 0.9    ------------------------------------------------------------------------------------------------------------------  IMPRESSION AND PLAN:   1.  Lower GI bleed.  Suspected diverticular bleed.  Continue to monitor for bleeding.  Serial hemoglobins.  If hemorrhages can get a bleeding scan.  Since the patient did have a colonoscopy last year we will just hold on GI consultation at this point.  Will observe overnight. 2.  Hypertension.  Continue Norvasc 3.  Hypothyroidism on levothyroxine 4.  GERD on Dexilant 5.  Allergic rhinitis on allergy medication 6.  Morbid obesity with a BMI of 40.79.  Weight loss needed   All the records are reviewed and case discussed with ED provider. Management plans discussed with the patient, and she is in agreement.  CODE STATUS: full code  TOTAL TIME TAKING CARE OF THIS PATIENT: 50 minutes.    Loletha Grayer M.D on 05/21/2018 at 2:25 PM  Between 7am to 6pm - Pager - [phone]  After 6pm call admission pager [phone]  [facility] Office  [phone]  CC: Primary care physician; Wardell Honour, MD

## 2018-05-21 NOTE — ED Notes (Signed)
Urine sent to the lab

## 2018-05-21 NOTE — Care Management Note (Signed)
Case Management Note  Patient Details  Name: Emily Melton MRN: [id number] Date of Birth: 1961-01-03  Subjective/Objective:          Patient is being placed under observation for lower GI bleeding.  Patient lives at home with her husband.  Her father is at the bedside in the ED.  Patient is independent in ADL's, no barriers to obtaining prescriptions, and has transportation- she drives her self.   No needs identified.  Doran Clay RN BSN [phone]            Action/Plan:   Expected Discharge Date:                  Expected Discharge Plan:  Home/Self Care  In-House Referral:     Discharge planning Services  CM Consult  Post Acute Care Choice:    Choice offered to:     DME Arranged:    DME Agency:     HH Arranged:    [place] Agency:     Status of Service:  Completed, signed off  If discussed at [place] of Stay Meetings, dates discussed:    Additional Comments:  Shelbie Hutching, RN 05/21/2018, 3:43 PM

## 2018-05-22 ENCOUNTER — Observation Stay: Payer: 59

## 2018-05-22 LAB — CBC
HCT: 33.7 % — ABNORMAL LOW (ref 36.0–46.0)
Hemoglobin: 11.3 g/dL — ABNORMAL LOW (ref 12.0–15.0)
MCH: 29.4 pg (ref 26.0–34.0)
MCHC: 33.5 g/dL (ref 30.0–36.0)
MCV: 87.5 fL (ref 80.0–100.0)
Platelets: 140 10*3/uL — ABNORMAL LOW (ref 150–400)
RBC: 3.85 MIL/uL — ABNORMAL LOW (ref 3.87–5.11)
RDW: 12.9 % (ref 11.5–15.5)
WBC: 4.2 10*3/uL (ref 4.0–10.5)
nRBC: 0 % (ref 0.0–0.2)

## 2018-05-22 LAB — BASIC METABOLIC PANEL
Anion gap: 5 (ref 5–15)
BUN: 17 mg/dL (ref 6–20)
CO2: 28 mmol/L (ref 22–32)
Calcium: 9.3 mg/dL (ref 8.9–10.3)
Chloride: 106 mmol/L (ref 98–111)
Creatinine, Ser: 0.67 mg/dL (ref 0.44–1.00)
GFR calc Af Amer: >60 mL/min (ref >60)
GFR calc non Af Amer: >60 mL/min (ref >60)
Glucose, Bld: 112 mg/dL — ABNORMAL HIGH (ref 70–99)
Potassium: 3.7 mmol/L (ref 3.5–5.1)
Sodium: 139 mmol/L (ref 135–145)

## 2018-05-22 LAB — HIV ANTIBODY (ROUTINE TESTING W REFLEX): HIV Screen 4th Generation wRfx: NONREACTIVE

## 2018-05-22 MED ORDER — MORPHINE SULFATE (PF) 2 MG/ML IV SOLN
2.0000 mg | INTRAVENOUS | Status: DC | PRN
  Administered 2018-05-22: 2 mg via INTRAVENOUS
  Filled 2018-05-22: qty 1

## 2018-05-22 MED ORDER — OXYCODONE HCL 5 MG PO TABS
5.0000 mg | ORAL_TABLET | Freq: Four times a day (QID) | ORAL | Status: DC | PRN
  Administered 2018-05-22: 01:00:00 5 mg via ORAL
  Filled 2018-05-22: qty 1

## 2018-05-22 MED ORDER — MOMETASONE FURO-FORMOTEROL FUM 200-5 MCG/ACT IN AERO
2.0000 | INHALATION_SPRAY | Freq: Two times a day (BID) | RESPIRATORY_TRACT | Status: DC
  Administered 2018-05-22 (×2): 2 via RESPIRATORY_TRACT
  Filled 2018-05-22: qty 8.8

## 2018-05-22 MED ORDER — IOPAMIDOL (ISOVUE-300) INJECTION 61%
100.0000 mL | Freq: Once | INTRAVENOUS | Status: AC | PRN
  Administered 2018-05-22: 13:00:00 100 mL via INTRAVENOUS

## 2018-05-22 MED ORDER — IOPAMIDOL (ISOVUE-300) INJECTION 61%: 15.0000 mL | INTRAVENOUS | Status: DC

## 2018-05-22 MED ORDER — IOPAMIDOL (ISOVUE-300) INJECTION 61%
15.0000 mL | INTRAVENOUS | Status: AC
  Administered 2018-05-22 (×2): 15 mL via ORAL

## 2018-05-22 NOTE — Progress Notes (Signed)
[place] [facility] [place] NAME: Emily Melton    MR#:  [id number]  DATE OF BIRTH:  12/12/1960  SUBJECTIVE:  CHIEF COMPLAINT:   Chief Complaint  Patient presents with  . GI Bleeding   -Has abdominal pain, nausea.  One episode of diarrhea yesterday nonbloody.  REVIEW OF SYSTEMS:  Review of Systems  Constitutional: Negative for chills, fever and malaise/fatigue.  HENT: Negative for congestion, hearing loss and nosebleeds.   Eyes: Negative for blurred vision and double vision.  Respiratory: Negative for cough, shortness of breath and wheezing.   Cardiovascular: Negative for chest pain and palpitations.  Gastrointestinal: Positive for abdominal pain, diarrhea and nausea. Negative for constipation and vomiting.  Genitourinary: Negative for dysuria.  Musculoskeletal: Negative for myalgias.  Neurological: Negative for dizziness, focal weakness, seizures, weakness and headaches.  Psychiatric/Behavioral: Negative for depression.    DRUG ALLERGIES:   Allergies  Allergen Reactions  . Duloxetine Hcl Other (See Comments)    Hypertension, mood changes  . Mirtazapine Swelling  . Serotonin Reuptake Inhibitors (Ssris) Other (See Comments)    Hypertension, mood changes  . St Johns Wort Anaphylaxis  . Bupropion Hcl Other (See Comments)    Mental changes and high blood pressure   . Celebrex [Celecoxib] Hypertension  . Effexor [Venlafaxine] Other (See Comments)    Moodiness, vivid dreams, poor sleep cycle.  Doristine Bosworth [Gabapentin (Once-Daily)] Cough  . Lyrica [Pregabalin] Other (See Comments) and Hypertension    Mood changes, eye twitching and UTI  . Methylphenidate Hcl Other (See Comments)    unknown  . Mirapex [Pramipexole]     HTN, hallucinations and nausea  . Modafinil Swelling and Hypertension  . Rabeprazole Sodium Swelling and Other (See Comments)    Headaches   . Savella [Milnacipran Hcl] Other (See Comments)  . Zegerid [Omeprazole] Other  (See Comments)  . Lisinopril Rash    Hypertension    VITALS:  Blood pressure 111/64, pulse (!) 54, temperature 97.6 F (36.4 C), temperature source Oral, resp. rate 18, height 5' 2"  (1.575 m), weight 101.2 kg, SpO2 100 %.  PHYSICAL EXAMINATION:  Physical Exam   GENERAL:  57 y.o.-year-old obese patient lying in the bed with no acute distress.  EYES: Pupils equal, round, reactive to light and accommodation. No scleral icterus. Extraocular muscles intact.  HEENT: Head atraumatic, normocephalic. Oropharynx and nasopharynx clear.  NECK:  Supple, no jugular venous distention. No thyroid enlargement, no tenderness.  LUNGS: Normal breath sounds bilaterally, no wheezing, rales,rhonchi or crepitation. No use of accessory muscles of respiration.  CARDIOVASCULAR: S1, S2 normal. No murmurs, rubs, or gallops.  ABDOMEN: Soft, tender in right lower quadrant and periumbilical, nondistended. Bowel sounds present. No organomegaly or mass.  EXTREMITIES: No pedal edema, cyanosis, or clubbing.  NEUROLOGIC: Cranial nerves II through XII are intact. Muscle strength 5/5 in all extremities. Sensation intact. Gait not checked.  PSYCHIATRIC: The patient is alert and oriented x 3.  SKIN: No obvious rash, lesion, or ulcer.    LABORATORY PANEL:   CBC Recent Labs  Lab 05/22/18 0052  WBC 4.2  HGB 11.3*  HCT 33.7*  PLT 140*   ------------------------------------------------------------------------------------------------------------------  Chemistries  Recent Labs  Lab 05/21/18 1118 05/22/18 0052  NA 140 139  K 3.9 3.7  CL 104 106  CO2 28 28  GLUCOSE 119* 112*  BUN 16 17  CREATININE 0.73 0.67  CALCIUM 9.4 9.3  AST 30  --   ALT 31  --   ALKPHOS  62  --   BILITOT 0.9  --    ------------------------------------------------------------------------------------------------------------------  Cardiac Enzymes No results for input(s): TROPONINI in the last 168  hours. ------------------------------------------------------------------------------------------------------------------  RADIOLOGY:  No results found.  EKG:   Orders placed or performed in visit on 05/24/16  . EKG 12-Lead    ASSESSMENT AND PLAN:   57 year old obese female with past medical history significant for arthritis, depression, fibromyalgia, GERD and hypertension presents to hospital secondary to abdominal cramping, diarrhea and nausea vomiting.  1.  Likely acute colitis/diverticulitis-stool studies are negative for C. difficile and other bacteria -Still complaining of significant abdominal pain with diarrhea -Hemoglobin slight drop noted, however steady. -Colonoscopy last year showing adenoma and diverticulosis -CT of the abdomen ordered -Continue IV fluids-based on the CT results we will see if she needs antibiotics  2.  Hypertension-on losartan, propranolol and Norvasc  3.  Hypothyroidism-on Synthroid  4.  GERD-Protonix  5.  DVT prophylaxis-teds and SCDs due to GI bleed.  Up and ambulatory at baseline   All the records are reviewed and case discussed with Care Management/Social Workerr. Management plans discussed with the patient, family and they are in agreement.  CODE STATUS: Full Code  TOTAL TIME TAKING CARE OF THIS PATIENT: 38 minutes.   POSSIBLE D/C IN 2 DAYS, DEPENDING ON CLINICAL CONDITION.   Gladstone Lighter M.D on 05/22/2018 at 11:15 AM  Between 7am to 6pm - Pager - [phone]  After 6pm go to [email or web address] - password [email or web address] [facility]  Office  [phone]  CC: Primary care physician; Wardell Honour, MD

## 2018-05-23 LAB — BASIC METABOLIC PANEL
Anion gap: 7 (ref 5–15)
BUN: 20 mg/dL (ref 6–20)
CO2: 29 mmol/L (ref 22–32)
Calcium: 9 mg/dL (ref 8.9–10.3)
Chloride: 104 mmol/L (ref 98–111)
Creatinine, Ser: 0.79 mg/dL (ref 0.44–1.00)
GFR calc Af Amer: >60 mL/min (ref >60)
GFR calc non Af Amer: >60 mL/min (ref >60)
Glucose, Bld: 118 mg/dL — ABNORMAL HIGH (ref 70–99)
Potassium: 3.9 mmol/L (ref 3.5–5.1)
Sodium: 140 mmol/L (ref 135–145)

## 2018-05-23 LAB — CBC
HCT: 32.8 % — ABNORMAL LOW (ref 36.0–46.0)
Hemoglobin: 10.8 g/dL — ABNORMAL LOW (ref 12.0–15.0)
MCH: 29.7 pg (ref 26.0–34.0)
MCHC: 32.9 g/dL (ref 30.0–36.0)
MCV: 90.1 fL (ref 80.0–100.0)
Platelets: 137 10*3/uL — ABNORMAL LOW (ref 150–400)
RBC: 3.64 MIL/uL — ABNORMAL LOW (ref 3.87–5.11)
RDW: 13.1 % (ref 11.5–15.5)
WBC: 4.1 10*3/uL (ref 4.0–10.5)
nRBC: 0 % (ref 0.0–0.2)

## 2018-05-23 MED ORDER — DICYCLOMINE HCL 10 MG PO CAPS: 10.0000 mg | ORAL_CAPSULE | Freq: Three times a day (TID) | ORAL | 0 refills | Status: AC | PRN

## 2018-05-23 NOTE — Progress Notes (Signed)
Pt D/C to home by self. IV removed intact. All questions answered. Education completed.

## 2018-05-23 NOTE — Discharge Instructions (Signed)
Acute gastroenteritis with dysentry

## 2018-05-23 NOTE — Discharge Summary (Signed)
[facility] [place] NAME: Emily Melton    MR#:  [id number]  DATE OF BIRTH:  1960-12-26  DATE OF ADMISSION:  05/21/2018   ADMITTING PHYSICIAN: Loletha Grayer, MD  DATE OF DISCHARGE:  05/23/18  PRIMARY CARE PHYSICIAN: Wardell Honour, MD   ADMISSION DIAGNOSIS:   Diverticulosis [K57.90] External hemorrhoid [K64.4] Lower GI bleed [K92.2]  DISCHARGE DIAGNOSIS:   Active Problems:   Lower GI bleed   SECONDARY DIAGNOSIS:   Past Medical History:  Diagnosis Date  . Allergic rhinitis, cause unspecified   . Allergy   . Anemia   . Arthritis    C-spine; 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 Barrett's esophagus   . Cervical radiculopathy   . Chicken pox    childhood  . Depression   . Diaphragmatic hernia without mention of obstruction or gangrene   . Fibromyalgia   . Fibromyalgia   . GERD (gastroesophageal reflux disease)   . HTN (hypertension)   . Insomnia, unspecified   . Migraine, unspecified, without mention of intractable migraine without mention of status migrainosus   . Narcolepsy    w/o cataplexy  . Nonspecific abnormal electrocardiogram (ECG) (EKG)   . Osteoporosis   . Other abnormal glucose   . Periodic limb movement disorder   . Personal history of colonic polyps   . Postlaminectomy syndrome of cervical region   . Tobacco use disorder   . Unspecified vitamin D deficiency     HOSPITAL COURSE:   57 year old obese female with past medical history significant for arthritis, depression, fibromyalgia, GERD and hypertension presents to hospital secondary to abdominal cramping, diarrhea and nausea vomiting.  1.  Acute gastroenteritis with dysentry-stool studies are negative for C. difficile and other bacteria -Much improved abdominal pain.  Bloody diarrhea has resolved. -Hemoglobin dropped but is stable at this time.. -Colonoscopy last year showing adenoma and diverticulosis-as repeat  colonoscopy in 2 more years. -CT of the abdomen without any acute colitis or diverticulitis. -Was treated for IBS symptoms in the past.  Added Bentyl for cramping pain as needed.  Symptoms are resolved at this time.  No indication for antibiotics.  Outpatient follow-up with GI.  2.  Hypertension-on losartan, propranolol and Norvasc  3.  Hypothyroidism-on Synthroid  4.  GERD-PPI   Up and ambulatory at baseline Discharged today  DISCHARGE CONDITIONS:   Guarded  CONSULTS OBTAINED:   None  DRUG ALLERGIES:   Allergies  Allergen Reactions  . Duloxetine Hcl Other (See Comments)    Hypertension, mood changes  . Mirtazapine Swelling  . Serotonin Reuptake Inhibitors (Ssris) Other (See Comments)    Hypertension, mood changes  . St Johns Wort Anaphylaxis  . Bupropion Hcl Other (See Comments)    Mental changes and high blood pressure   . Celebrex [Celecoxib] Hypertension  . Effexor [Venlafaxine] Other (See Comments)    Moodiness, vivid dreams, poor sleep cycle.  Doristine Bosworth [Gabapentin (Once-Daily)] Cough  . Lyrica [Pregabalin] Other (See Comments) and Hypertension    Mood changes, eye twitching and UTI  . Methylphenidate Hcl Other (See Comments)    unknown  . Mirapex [Pramipexole]     HTN, hallucinations and nausea  . Modafinil Swelling and Hypertension  . Rabeprazole Sodium Swelling and Other (See Comments)    Headaches   . Savella [Milnacipran Hcl] Other (See Comments)  . Zegerid [Omeprazole] Other (See Comments)  . Lisinopril Rash    Hypertension  DISCHARGE MEDICATIONS:   Allergies as of 05/23/2018      Reactions   Duloxetine Hcl Other (See Comments)   Hypertension, mood changes   Mirtazapine Swelling   Serotonin Reuptake Inhibitors (ssris) Other (See Comments)   Hypertension, mood changes   St Johns Wort Anaphylaxis   Bupropion Hcl Other (See Comments)   Mental changes and high blood pressure    Celebrex [celecoxib] Hypertension   Effexor [venlafaxine] Other  (See Comments)   Moodiness, vivid dreams, poor sleep cycle.   Gralise [gabapentin (once-daily)] Cough   Lyrica [pregabalin] Other (See Comments), Hypertension   Mood changes, eye twitching and UTI   Methylphenidate Hcl Other (See Comments)   unknown   Mirapex [pramipexole]    HTN, hallucinations and nausea   Modafinil Swelling, Hypertension   Rabeprazole Sodium Swelling, Other (See Comments)   Headaches    Savella [milnacipran Hcl] Other (See Comments)   Zegerid [omeprazole] Other (See Comments)   Lisinopril Rash   Hypertension      Medication List    TAKE these medications   amLODipine 5 MG tablet Commonly known as:  NORVASC Take 5 mg by mouth daily.   cetirizine 10 MG tablet Commonly known as:  ZYRTEC Take 10 mg by mouth at bedtime.   DEXILANT 30 MG capsule Generic drug:  Dexlansoprazole Take 30 mg by mouth daily.   dicyclomine 10 MG capsule Commonly known as:  BENTYL Take 1 capsule (10 mg total) by mouth 3 (three) times daily as needed for up to 14 days for spasms.   hyoscyamine 0.125 MG Tbdp disintergrating tablet Commonly known as:  ANASPAZ Place 0.125-0.25 mg under the tongue every 6 (six) hours as needed for cramping.   levothyroxine 50 MCG tablet Commonly known as:  SYNTHROID, LEVOTHROID Take 75 mcg by mouth daily before breakfast.   losartan 100 MG tablet Commonly known as:  COZAAR Take 1 tablet (100 mg total) by mouth daily. What changed:  when to take this   montelukast 10 MG tablet Commonly known as:  SINGULAIR Take 1 tablet (10 mg total) by mouth at bedtime.   PROAIR HFA 108 (90 Base) MCG/ACT inhaler Generic drug:  albuterol inhale 2 puffs by mouth every 4 to 6 hours if needed for BREATHING What changed:    how much to take  how to take this  when to take this  reasons to take this  additional instructions   propranolol 20 MG tablet Commonly known as:  INDERAL Take 3 tablets (60 mg total) by mouth 2 (two) times daily.   tiZANidine  4 MG tablet Commonly known as:  ZANAFLEX Take 1 tablet (4 mg total) by mouth every 6 (six) hours as needed for muscle spasms.        DISCHARGE INSTRUCTIONS:   1.  PCP follow-up in 1 to 2 weeks 2.  GI follow-up in 1 to 2 weeks  DIET:   Cardiac diet  ACTIVITY:   Activity as tolerated  OXYGEN:   Home Oxygen: No.  Oxygen Delivery: room air  DISCHARGE LOCATION:   home   If you experience worsening of your admission symptoms, develop shortness of breath, life threatening emergency, suicidal or homicidal thoughts you must seek medical attention immediately by calling 911 or calling your MD immediately  if symptoms less severe.  You Must read complete instructions/literature along with all the possible adverse reactions/side effects for all the Medicines you take and that have been prescribed to you. Take any new Medicines after you have  completely understood and accpet all the possible adverse reactions/side effects.   Please note  You were cared for by a hospitalist during your hospital stay. If you have any questions about your discharge medications or the care you received while you were in the hospital after you are discharged, you can call the unit and asked to speak with the hospitalist on call if the hospitalist that took care of you is not available. Once you are discharged, your primary care physician will handle any further medical issues. Please note that NO REFILLS for any discharge medications will be authorized once you are discharged, as it is imperative that you return to your primary care physician (or establish a relationship with a primary care physician if you do not have one) for your aftercare needs so that they can reassess your need for medications and monitor your lab values.    On the day of Discharge:  VITAL SIGNS:   Blood pressure (!) 112/56, pulse (!) 58, temperature 97.7 F (36.5 C), temperature source Oral, resp. rate 20, height 5' 2"  (1.575 m),  weight 101.2 kg, SpO2 96 %.  PHYSICAL EXAMINATION:    GENERAL:  57 y.o.-year-old obese patient lying in the bed with no acute distress.  EYES: Pupils equal, round, reactive to light and accommodation. No scleral icterus. Extraocular muscles intact.  HEENT: Head atraumatic, normocephalic. Oropharynx and nasopharynx clear.  NECK:  Supple, no jugular venous distention. No thyroid enlargement, no tenderness.  LUNGS: Normal breath sounds bilaterally, no wheezing, rales,rhonchi or crepitation. No use of accessory muscles of respiration.  CARDIOVASCULAR: S1, S2 normal. No murmurs, rubs, or gallops.  ABDOMEN: Soft, tender in right lower quadrant and periumbilical, nondistended. Bowel sounds present. No organomegaly or mass.  EXTREMITIES: No pedal edema, cyanosis, or clubbing.  NEUROLOGIC: Cranial nerves II through XII are intact. Muscle strength 5/5 in all extremities. Sensation intact. Gait not checked.  PSYCHIATRIC: The patient is alert and oriented x 3.  SKIN: No obvious rash, lesion, or ulcer.   DATA REVIEW:   CBC Recent Labs  Lab 05/23/18 0302  WBC 4.1  HGB 10.8*  HCT 32.8*  PLT 137*    Chemistries  Recent Labs  Lab 05/21/18 1118  05/23/18 0302  NA 140   < > 140  K 3.9   < > 3.9  CL 104   < > 104  CO2 28   < > 29  GLUCOSE 119*   < > 118*  BUN 16   < > 20  CREATININE 0.73   < > 0.79  CALCIUM 9.4   < > 9.0  AST 30  --   --   ALT 31  --   --   ALKPHOS 62  --   --   BILITOT 0.9  --   --    < > = values in this interval not displayed.     Microbiology Results  Results for orders placed or performed during the hospital encounter of 05/21/18  C difficile quick scan w PCR reflex     Status: None   Collection Time: 05/21/18 10:11 AM  Result Value Ref Range Status   C Diff antigen NEGATIVE NEGATIVE Final   C Diff toxin NEGATIVE NEGATIVE Final   C Diff interpretation No C. difficile detected.  Final    Comment: Performed at [facility], [address]  [place], [place] [address]  Gastrointestinal Panel by PCR , Stool     Status: None   Collection Time:  05/21/18 10:11 AM  Result Value Ref Range Status   Campylobacter species NOT DETECTED NOT DETECTED Final   Plesimonas shigelloides NOT DETECTED NOT DETECTED Final   Salmonella species NOT DETECTED NOT DETECTED Final   Yersinia enterocolitica NOT DETECTED NOT DETECTED Final   Vibrio species NOT DETECTED NOT DETECTED Final   Vibrio cholerae NOT DETECTED NOT DETECTED Final   Enteroaggregative E coli (EAEC) NOT DETECTED NOT DETECTED Final   Enteropathogenic E coli (EPEC) NOT DETECTED NOT DETECTED Final   Enterotoxigenic E coli (ETEC) NOT DETECTED NOT DETECTED Final   Shiga like toxin producing E coli (STEC) NOT DETECTED NOT DETECTED Final   Shigella/Enteroinvasive E coli (EIEC) NOT DETECTED NOT DETECTED Final   Cryptosporidium NOT DETECTED NOT DETECTED Final   Cyclospora cayetanensis NOT DETECTED NOT DETECTED Final   Entamoeba histolytica NOT DETECTED NOT DETECTED Final   Giardia lamblia NOT DETECTED NOT DETECTED Final   Adenovirus F40/41 NOT DETECTED NOT DETECTED Final   Astrovirus NOT DETECTED NOT DETECTED Final   Norovirus GI/GII NOT DETECTED NOT DETECTED Final   Rotavirus A NOT DETECTED NOT DETECTED Final   Sapovirus (I, II, IV, and V) NOT DETECTED NOT DETECTED Final    Comment: Performed at [facility], [address] [place], [place] [address]    RADIOLOGY:  Ct Abdomen Pelvis W Contrast  Result Date: 05/22/2018 CLINICAL DATA:  Abdominal pain and nausea.  Diarrhea yesterday. EXAM: CT ABDOMEN AND PELVIS WITH CONTRAST TECHNIQUE: Multidetector CT imaging of the abdomen and pelvis was performed using the standard protocol following bolus administration of intravenous contrast. CONTRAST:  132m ISOVUE-300 IOPAMIDOL (ISOVUE-300) INJECTION 61% COMPARISON:  Abdominal ultrasound 08/24/2017.  MRCP 11/13/2016. FINDINGS: Lower chest: Mild atelectasis or scarring at the lung bases. No  significant pleural or pericardial effusion. Hepatobiliary: There is probable mild subjective hepatic steatosis. No focal hepatic lesions are identified. There is progressive intra and extrahepatic biliary dilatation status post cholecystectomy. The common hepatic duct measures up to 20 mm in diameter (coronal image 45/6). There is no evidence of choledocholithiasis. Pancreas: Unremarkable. No pancreatic ductal dilatation or surrounding inflammatory changes. Spleen: Normal in size without focal abnormality. Adrenals/Urinary Tract: Both adrenal glands appear normal. There is a tiny nonobstructing calculus in the mid left kidney. No evidence of ureteral calculus or hydronephrosis. There are stable small cysts in the lower poles of both kidneys. The kidneys otherwise appear normal. The bladder appears normal. Stomach/Bowel: No evidence of bowel wall thickening, distention or surrounding inflammatory change. The appendix appears normal. The cecum is located in the right mid abdomen. There is a fatty ileocecal valve and mild sigmoid diverticulosis. Vascular/Lymphatic: There are no enlarged abdominal or pelvic lymph nodes. Mild aortic and branch vessel atherosclerosis without evidence of acute vascular findings. Reproductive: Hysterectomy with probable residual ovarian tissue on the left. No suspicious adnexal findings. Other: Thinning of the anterior abdominal wall without focal hernia. No ascites. Musculoskeletal: No acute or significant osseous findings. Mild lumbar spondylosis. Transitional lumbosacral anatomy on the left. IMPRESSION: 1. No acute findings or explanation for the patient's symptoms. There is no evidence of bowel obstruction or appendicitis. 2. Biliary dilatation has mildly progressed compared with the patient's prior studies, although is likely physiologic post cholecystectomy in this patient with normal liver function studies done yesterday. 3. Tiny nonobstructing left renal calculus.  No ureteral  calculus. 4. Mild Aortic Atherosclerosis (ICD10-I70.0). Electronically Signed   By: WRichardean SaleM.D.   On: 05/22/2018 13:13     Management plans discussed with the  patient, family and they are in agreement.  CODE STATUS:     Code Status Orders  (From admission, onward)         Start     Ordered   05/21/18 1423  Full code  Continuous     05/21/18 1423        Code Status History    This patient has a current code status but no historical code status.      TOTAL TIME TAKING CARE OF THIS PATIENT: 38 minutes.    Gladstone Lighter M.D on 05/23/2018 at 10:27 AM  Between 7am to 6pm - Pager - [phone]  After 6pm go to [email or web address] - Proofreader  [facility]  Office  [phone]  CC: Primary care physician; Wardell Honour, MD   Note: This dictation was prepared with Dragon dictation along with smaller phrase technology. Any transcriptional errors that result from this process are unintentional.

## 2018-05-28 ENCOUNTER — Emergency Department
Admission: EM | Admit: 2018-05-28 | Discharge: 2018-05-28 | Disposition: A | Discharge status: Home / Self Care | Payer: 59 | Attending: Emergency Medicine | Admitting: Emergency Medicine

## 2018-05-28 DIAGNOSIS — J449 Chronic obstructive pulmonary disease, unspecified: Secondary | ICD-10-CM | POA: Diagnosis not present

## 2018-05-28 DIAGNOSIS — K625 Hemorrhage of anus and rectum: Secondary | ICD-10-CM | POA: Diagnosis present

## 2018-05-28 DIAGNOSIS — Z87891 Personal history of nicotine dependence: Secondary | ICD-10-CM | POA: Insufficient documentation

## 2018-05-28 DIAGNOSIS — Z79899 Other long term (current) drug therapy: Secondary | ICD-10-CM | POA: Insufficient documentation

## 2018-05-28 DIAGNOSIS — K922 Gastrointestinal hemorrhage, unspecified: Secondary | ICD-10-CM | POA: Insufficient documentation

## 2018-05-28 DIAGNOSIS — I10 Essential (primary) hypertension: Secondary | ICD-10-CM | POA: Diagnosis not present

## 2018-05-28 LAB — BASIC METABOLIC PANEL
Anion gap: 8 (ref 5–15)
BUN: 13 mg/dL (ref 6–20)
CO2: 29 mmol/L (ref 22–32)
Calcium: 9.3 mg/dL (ref 8.9–10.3)
Chloride: 104 mmol/L (ref 98–111)
Creatinine, Ser: 0.76 mg/dL (ref 0.44–1.00)
GFR calc Af Amer: >60 mL/min (ref >60)
GFR calc non Af Amer: >60 mL/min (ref >60)
Glucose, Bld: 112 mg/dL — ABNORMAL HIGH (ref 70–99)
Potassium: 4 mmol/L (ref 3.5–5.1)
Sodium: 141 mmol/L (ref 135–145)

## 2018-05-28 LAB — CBC WITH DIFFERENTIAL/PLATELET
Abs Immature Granulocytes: 0.01 10*3/uL (ref 0.00–0.07)
Basophils Absolute: 0 10*3/uL (ref 0.0–0.1)
Basophils Relative: 1 %
Eosinophils Absolute: 0.2 10*3/uL (ref 0.0–0.5)
Eosinophils Relative: 3 %
HCT: 36 % (ref 36.0–46.0)
Hemoglobin: 11.7 g/dL — ABNORMAL LOW (ref 12.0–15.0)
Immature Granulocytes: 0 %
Lymphocytes Relative: 28 %
Lymphs Abs: 1.3 10*3/uL (ref 0.7–4.0)
MCH: 28.5 pg (ref 26.0–34.0)
MCHC: 32.5 g/dL (ref 30.0–36.0)
MCV: 87.8 fL (ref 80.0–100.0)
Monocytes Absolute: 0.4 10*3/uL (ref 0.1–1.0)
Monocytes Relative: 9 %
Neutro Abs: 2.7 10*3/uL (ref 1.7–7.7)
Neutrophils Relative %: 59 %
Platelets: 156 10*3/uL (ref 150–400)
RBC: 4.1 MIL/uL (ref 3.87–5.11)
RDW: 12.8 % (ref 11.5–15.5)
WBC: 4.6 10*3/uL (ref 4.0–10.5)
nRBC: 0 % (ref 0.0–0.2)

## 2018-05-28 LAB — HEPATIC FUNCTION PANEL
ALT: 27 U/L (ref 0–44)
AST: 25 U/L (ref 15–41)
Albumin: 4.1 g/dL (ref 3.5–5.0)
Alkaline Phosphatase: 60 U/L (ref 38–126)
Bilirubin, Direct: <0.1 mg/dL (ref 0.0–0.2)
Total Bilirubin: 0.9 mg/dL (ref 0.3–1.2)
Total Protein: 7 g/dL (ref 6.5–8.1)

## 2018-05-28 LAB — LIPASE, BLOOD: Lipase: 45 U/L (ref 11–51)

## 2018-05-28 LAB — TYPE AND SCREEN
ABO/RH(D): A POS
Antibody Screen: NEGATIVE

## 2018-05-28 LAB — PROTIME-INR
INR: 1
Prothrombin Time: 13.1 seconds (ref 11.4–15.2)

## 2018-05-28 NOTE — ED Notes (Signed)
Pt c/o bleeding from rectum, pt was seen here last week for same thing. Pt states it had stopped and started back this morning, pt c/o lower right abd pain denies n/v

## 2018-05-28 NOTE — Discharge Instructions (Addendum)
Follow-up with the GI nurse practitioner Christiane tomorrow at 1145 at [facility].  Return to the ER for new, worsening, persistent severe bleeding, weakness or lightheadedness, persistent or severe abdominal pain, vomiting, or any other new or worsening symptoms that concern you.

## 2018-05-28 NOTE — ED Triage Notes (Signed)
Pt came to ED via pov for rectal bleeding. Was seen last week and admitted for observation. Sent home with bentyl. Pt reports bleeding staretd again this morning as well as lower abdominal cramping.

## 2018-05-28 NOTE — ED Provider Notes (Signed)
[facility] Emergency Department Provider Note ____________________________________________   First MD Initiated Contact with Patient 05/28/18 [phone]     (approximate)  I have reviewed the triage vital signs and the nursing notes.   HISTORY  Chief Complaint Rectal Bleeding    HPI Emily Melton is a 57 y.o. female with PMH as noted below who presents with rectal bleeding, acute onset this morning, described as bright red blood with some clots, and occurring while she was trying to have a bowel movement.  She reports some crampy abdominal discomfort but no focal pain, and denies vomiting.  She also denies weakness or lightheadedness.  The patient initially had rectal bleeding a week ago and was admitted to the hospital.  The bleeding resolved and she had been feeling well since she left the hospital 5 days ago.  She is supposed to follow-up with GI in 1 week.   Past Medical History:  Diagnosis Date  . Allergic rhinitis, cause unspecified   . Allergy   . Anemia   . Arthritis    C-spine; 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 Barrett's esophagus   . Cervical radiculopathy   . Chicken pox    childhood  . Depression   . Diaphragmatic hernia without mention of obstruction or gangrene   . Fibromyalgia   . Fibromyalgia   . GERD (gastroesophageal reflux disease)   . HTN (hypertension)   . Insomnia, unspecified   . Migraine, unspecified, without mention of intractable migraine without mention of status migrainosus   . Narcolepsy    w/o cataplexy  . Nonspecific abnormal electrocardiogram (ECG) (EKG)   . Osteoporosis   . Other abnormal glucose   . Periodic limb movement disorder   . Personal history of colonic polyps   . Postlaminectomy syndrome of cervical region   . Tobacco use disorder   . Unspecified vitamin D deficiency     Patient Active Problem List   Diagnosis Date Noted  . Lower GI bleed 05/21/2018  . NASH  (nonalcoholic steatohepatitis) 01/31/2018  . Primary hyperparathyroidism ([place]) 01/31/2018  . Status post arthroscopy of right knee 08/28/2016  . Periodic limb movement sleep disorder 12/08/2015  . COPD mixed type ([place]) 08/13/2015  . Pure hypercholesterolemia 06/30/2013  . Glucose intolerance (impaired glucose tolerance) 06/30/2013  . DDD (degenerative disc disease), cervical 06/30/2013  . Essential hypertension, benign 11/17/2012  . Insomnia 11/17/2012  . ELECTROCARDIOGRAM, ABNORMAL 09/22/2010    Past Surgical History:  Procedure Laterality Date  . ABDOMINAL HYSTERECTOMY     fibroids and endometriosis.  ovarian resection R.  . BACK SURGERY    . BREAST EXCISIONAL BIOPSY  2005    R   Benign  . BREAST SURGERY    . carpal tunnel release - bilateral    . CESAREAN SECTION     x2  1984 and 1986  . CHOLECYSTECTOMY    . 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 COLONOSCOPY  03/17/2012   [facility]  . COLONOSCOPY WITH PROPOFOL N/A 12/08/2016   Procedure: COLONOSCOPY WITH PROPOFOL;  Surgeon: Lollie Sails, MD;  Location: [facility] ENDOSCOPY;  Service: Endoscopy;  Laterality: N/A;  . DIAGNOSTIC LAPAROSCOPY    . DILATION AND CURETTAGE OF UTERUS     SAB  . ESOPHAGOGASTRODUODENOSCOPY  03/17/2012   no Barrett's esophagus. [facility].  . fusion of cervical spine  2007   C5,C6,C7, and T1  . KNEE SURGERY    .  laprascopy  2009   for RLQ pain:R oophorectomy performed no improvement in pain.   . pilonidal cyst resection  1992  . R shoulder surgery  06/24/09   2 tears in rotator cuff,bone spurs. Dr. Modena Morrow  . SHOULDER ARTHROSCOPY Right 2010  . SPINE SURGERY  09/2011   Cervical spine fusion  . TUBAL LIGATION    . tubel ligation  2002  . VAGINAL HYSTERECTOMY  2009   fibroid/endometriosis, one L ovary remaining    Prior to Admission medications   Medication Sig Start Date End Date Taking? Authorizing Provider  amLODipine (NORVASC) 5 MG tablet Take 5 mg by mouth daily. 05/13/18  Yes  [provider]  cetirizine (ZYRTEC) 10 MG tablet Take 10 mg by mouth at bedtime.    Yes [provider]  Dexlansoprazole (DEXILANT) 30 MG capsule Take 30 mg by mouth daily.   Yes [provider]  dicyclomine (BENTYL) 10 MG capsule Take 1 capsule (10 mg total) by mouth 3 (three) times daily as needed for up to 14 days for spasms. 05/23/18 06/06/18 Yes Gladstone Lighter, MD  hyoscyamine (ANASPAZ) 0.125 MG TBDP Place 0.125-0.25 mg under the tongue every 6 (six) hours as needed for cramping.    Yes [provider]  levothyroxine (SYNTHROID, LEVOTHROID) 50 MCG tablet Take 75 mcg by mouth daily before breakfast.    Yes [provider]  losartan (COZAAR) 100 MG tablet Take 1 tablet (100 mg total) by mouth daily. Patient taking differently: Take 100 mg by mouth at bedtime.  01/30/18  Yes Wardell Honour, MD  montelukast (SINGULAIR) 10 MG tablet Take 1 tablet (10 mg total) by mouth at bedtime. 01/30/18  Yes Wardell Honour, MD  PROAIR HFA 108 [phone] Base) MCG/ACT inhaler inhale 2 puffs by mouth every 4 to 6 hours if needed for BREATHING Patient taking differently: Inhale 2 puffs into the lungs every 4 (four) hours as needed for wheezing or shortness of breath.  04/22/18  Yes Young, Tarri Fuller D, MD  propranolol (INDERAL) 20 MG tablet Take 3 tablets (60 mg total) by mouth 2 (two) times daily. 01/30/18  Yes Wardell Honour, MD  tiZANidine (ZANAFLEX) 4 MG tablet Take 1 tablet (4 mg total) by mouth every 6 (six) hours as needed for muscle spasms. 01/30/18  Yes Wardell Honour, MD    Allergies Duloxetine hcl; Mirtazapine; Serotonin reuptake inhibitors (ssris); St johns wort; Bupropion hcl; Celebrex [celecoxib]; Effexor [venlafaxine]; Gralise [gabapentin (once-daily)]; Lyrica [pregabalin]; Methylphenidate hcl; Mirapex [pramipexole]; Modafinil; Rabeprazole sodium; Savella [milnacipran hcl]; Zegerid [omeprazole]; and Lisinopril  Family History  Problem Relation Age of Onset  .  Bipolar disorder Mother   . Cancer Mother        bladder  . Hypertension Mother   . Colon polyps Mother   . Osteoarthritis Mother   . Diabetes Mother   . Thyroid disease Mother   . Heart disease Father 36       CAD/AMI with stenting/PAD.  Marland Kitchen Hyperlipidemia Father   . Cancer Father        skin  . Angina Father   . Asthma Daughter   . Cancer Maternal Grandmother        colon  . Heart disease Maternal Grandmother   . Cancer Maternal Grandfather        lung  . Parkinson's disease Maternal Grandfather   . Stroke Maternal Grandfather   . Diabetes Maternal Grandfather   . Hyperlipidemia Maternal Grandfather   . Rheum arthritis Paternal Grandmother   .  Heart attack Paternal Grandfather   . Pulmonary fibrosis Paternal Aunt   . Pulmonary fibrosis Paternal Uncle     Social History Social History   Tobacco Use  . Smoking status: Former Smoker    Packs/day: 1.00    Years: 36.00    Pack years: 36.00    Types: Cigarettes    Last attempt to quit: 11/28/2013    Years since quitting: 4.4  . Smokeless tobacco: Former Systems developer    Quit date: 11/19/2013  . Tobacco comment: smoked 1 ppd 30-35 years; quit in 2011  Substance Use Topics  . Alcohol use: No    Alcohol/week: 0.0 standard drinks    Comment: RARELY  . Drug use: No    Review of Systems  Constitutional: No fever. Eyes: No redness. ENT: No sore throat. Cardiovascular: Denies chest pain. Respiratory: Denies shortness of breath. Gastrointestinal: No vomiting.  Genitourinary: Negative for dysuria.  Musculoskeletal: Negative for back pain. Skin: Negative for rash. Neurological: Negative for headache.   ____________________________________________   PHYSICAL EXAM:  VITAL SIGNS: ED Triage Vitals  Enc Vitals Group     BP 05/28/18 0937 (!) 145/62     Pulse Rate 05/28/18 0937 (!) 51     Resp 05/28/18 0937 18     Temp 05/28/18 0937 98.4 F (36.9 C)     Temp Source 05/28/18 0937 Oral     SpO2 05/28/18 0937 94 %     Weight  05/28/18 0938 222 lb 10.6 oz (101 kg)     Height 05/28/18 0938 5' 2"  (1.575 m)     Head Circumference --      Peak Flow --      Pain Score 05/28/18 0947 4     Pain Loc --      Pain Edu? --      Excl. in [place]? --     Constitutional: Alert and oriented.  Relatively well appearing and in no acute distress. Eyes: Conjunctivae are normal.  No pallor Head: Atraumatic. Nose: No congestion/rhinnorhea. Mouth/Throat: Mucous membranes are moist.   Neck: Normal range of motion.  Cardiovascular: Good peripheral circulation. Respiratory: Normal respiratory effort.   Gastrointestinal: Soft with mild discomfort to deep palpation but no focal tenderness. No distention.  Genitourinary: No flank tenderness. Musculoskeletal: No lower extremity edema.  Extremities warm and well perfused.  Neurologic:  Normal speech and language. No gross focal neurologic deficits are appreciated.  Skin:  Skin is warm and dry. No rash noted. Psychiatric: Mood and affect are normal. Speech and behavior are normal.  ____________________________________________   LABS (all labs ordered are listed, but only abnormal results are displayed)  Labs Reviewed  CBC WITH DIFFERENTIAL/PLATELET - Abnormal; Notable for the following components:      Result Value   Hemoglobin 11.7 (*)    All other components within normal limits  BASIC METABOLIC PANEL - Abnormal; Notable for the following components:   Glucose, Bld 112 (*)    All other components within normal limits  PROTIME-INR  HEPATIC FUNCTION PANEL  LIPASE, BLOOD  TYPE AND SCREEN   ____________________________________________  EKG   ____________________________________________  RADIOLOGY    ____________________________________________   PROCEDURES  Procedure(s) performed: No  Procedures  Critical Care performed: No ____________________________________________   INITIAL IMPRESSION / ASSESSMENT AND PLAN / ED COURSE  Pertinent labs & imaging results  that were available during my care of the patient were reviewed by me and considered in my medical decision making (see chart for details).  57 year old female with  PMH as noted above presents with recurrent bright red blood per rectum with some clots occurring twice this morning.  She has some crampy abdominal discomfort but no other acute symptoms and does not feel weak or lightheaded.  I reviewed the past medical records in epic; the patient was admitted from 11/5-11/7 after she presented to the ER with a similar complaint.  She had a CT done on 05/22/2018 which showed no significant acute findings.  The patient's bleeding stopped while she was in the hospital in her stool studies were negative.  Her most recent colonoscopy was last year showing diverticulosis.  On exam today the patient is relatively well-appearing and her vital signs are normal.  Her abdomen is soft with no focal tenderness.  Overall I suspect most likely diverticular bleeding or other lower GI source.  The patient appears clinically stable.  We will repeat labs in order to trend her hemoglobin.  I will discuss the case with her gastroenterologist to determine the further plan.  ----------------------------------------- 1:43 PM on 05/28/2018 -----------------------------------------  The hemoglobin is improved from the patient's admission.  I discussed her case with the NP Christiane London from the patient's GI practice who was familiar with her case.  She she agreed with me that based on the patient's clinical status she was appropriate for discharge home today, and advised that the patient should follow-up with her tomorrow morning.    The patient is remained stable in the ED.  She had one more bowel movement with some blood in it but her vital signs are still normal and she has no other symptoms.  She feels comfortable with going home and is stable for discharge at this time.  I gave her thorough return precautions and she  expressed understanding.  I also gave her the specific instructions of when and where to follow-up with GI and she agrees with the plan.   ____________________________________________   FINAL CLINICAL IMPRESSION(S) / ED DIAGNOSES  Final diagnoses:  Lower GI bleed      NEW MEDICATIONS STARTED DURING THIS VISIT:  New Prescriptions   No medications on file     Note:  This document was prepared using Dragon voice recognition software and may include unintentional dictation errors.    Arta Silence, MD 05/28/18 1344

## 2018-07-03 ENCOUNTER — Telehealth: Payer: Self-pay | Admitting: Internal Medicine

## 2018-07-03 NOTE — Telephone Encounter (Signed)
Spoke with pt, she states her albuterol  (ProAir) will not be covered at the beginning of the year. I advised her that we may need to change the albuterol to Proventil or Ventolin whichever one her insurance prefers. Pt understood and will call us back when she needs the new Rx sent in. Nothing further I needed.

## 2018-07-25 ENCOUNTER — Telehealth: Payer: Self-pay | Admitting: Internal Medicine

## 2018-07-25 MED ORDER — ALBUTEROL SULFATE HFA 108 (90 BASE) MCG/ACT IN AERS: 2.0000 | INHALATION_SPRAY | RESPIRATORY_TRACT | 2 refills | Status: DC | PRN

## 2018-07-25 NOTE — Telephone Encounter (Signed)
Spoke with pt, I advised her that I would send in the generic albuterol. Nothing further is needed.   I called [employer] and they gave me a serial number to use for the type of albuterol that they cover. I called the pharmacist and they advised me to send in a generic brand Rx and that it could go through.

## 2018-07-25 NOTE — Telephone Encounter (Signed)
Spoke with pt,

## 2018-10-13 ENCOUNTER — Other Ambulatory Visit: Payer: Self-pay | Admitting: Internal Medicine

## 2018-12-12 ENCOUNTER — Other Ambulatory Visit: Payer: Self-pay | Admitting: Internal Medicine

## 2018-12-18 ENCOUNTER — Other Ambulatory Visit: Payer: Self-pay | Admitting: Family Medicine

## 2018-12-18 DIAGNOSIS — Z1231 Encounter for screening mammogram for malignant neoplasm of breast: Secondary | ICD-10-CM

## 2019-01-22 IMAGING — US US ABDOMEN COMPLETE
1 series · 13 of 25 positions shown · non-contrast
Comparison: Abdominal ultrasound April 19, 2016

CLINICAL DATA: Nonalcoholic fatty liver disease, cholecystectomy in
2994.

EXAM:
ABDOMEN ULTRASOUND COMPLETE

[Series 1: us abdomen complete · 0.23mm/px · 13 of 128 slices shown]
[im 1/128]
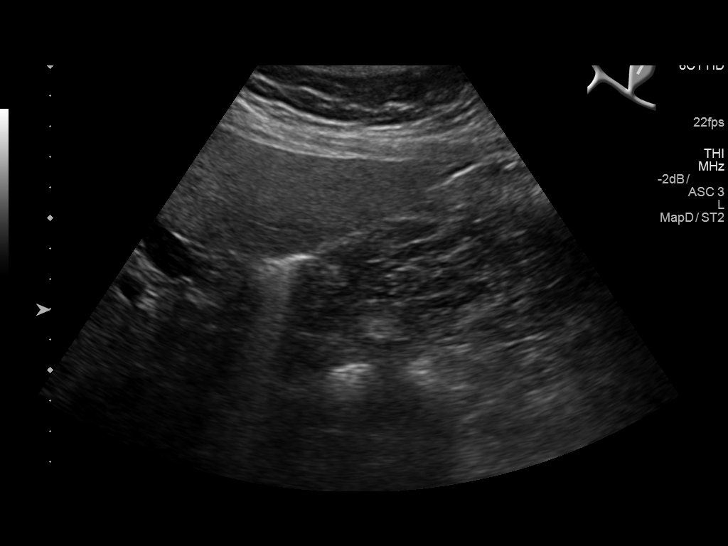
[im 11/128]
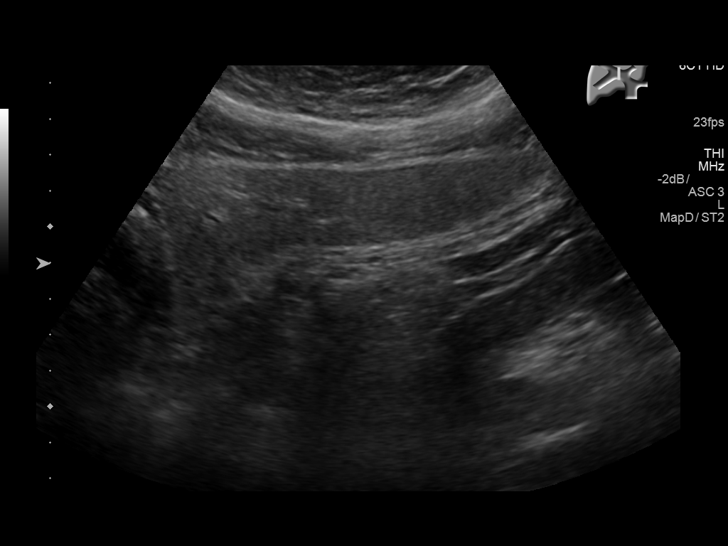
[im 22/128]
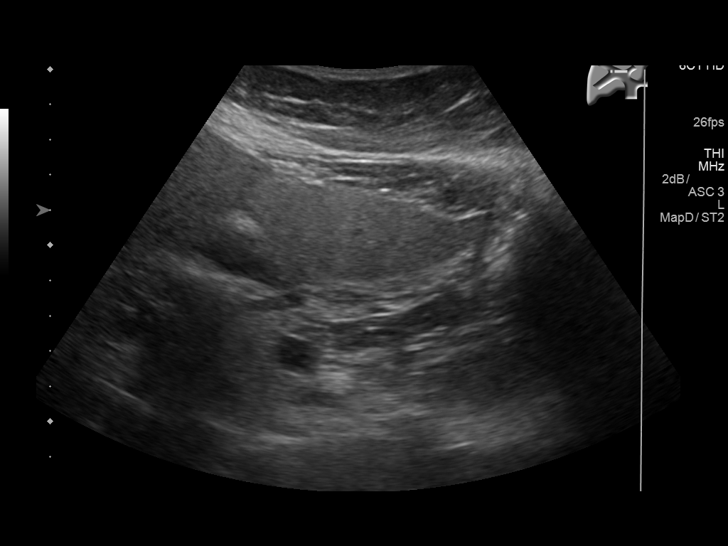
[im 32/128]
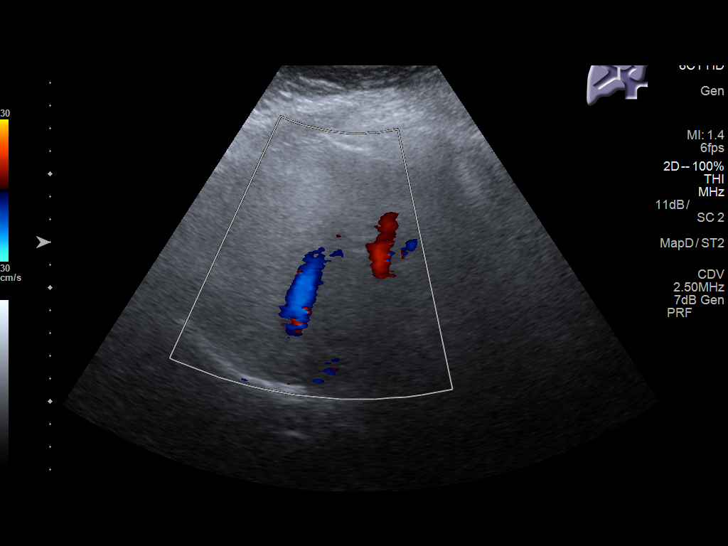
[im 43/128]
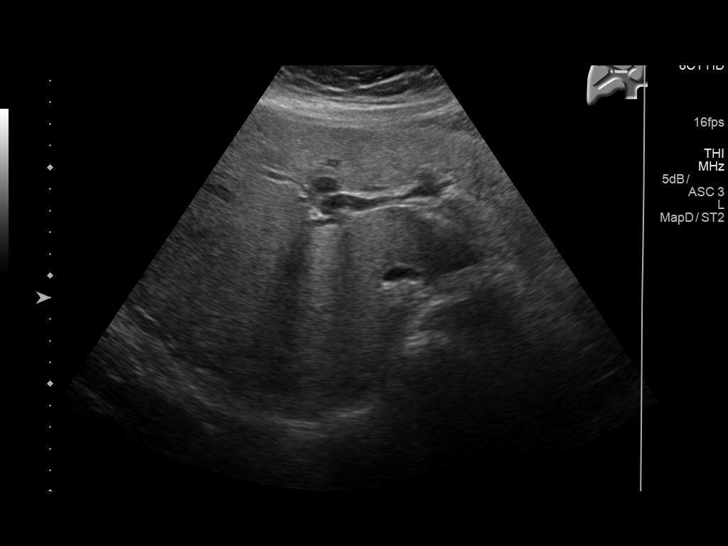
[im 53/128]
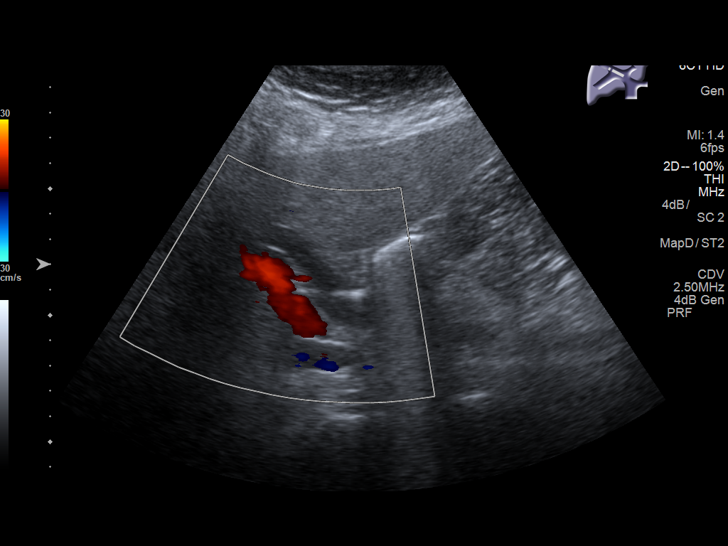
[im 64/128]
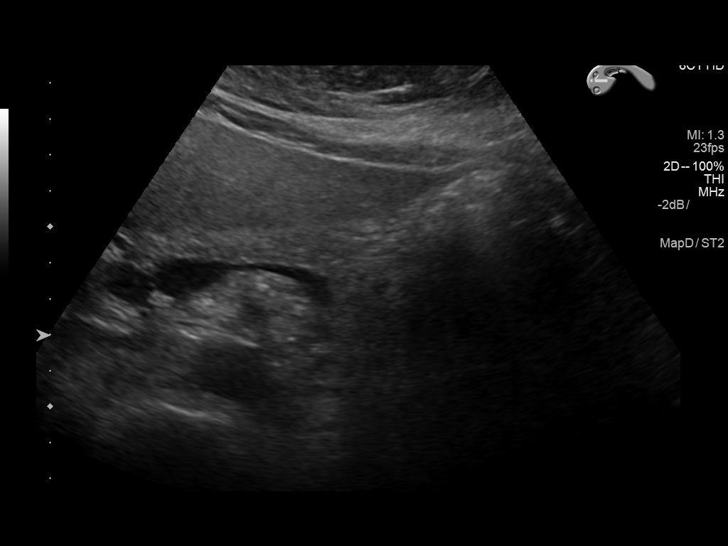
[im 75/128]
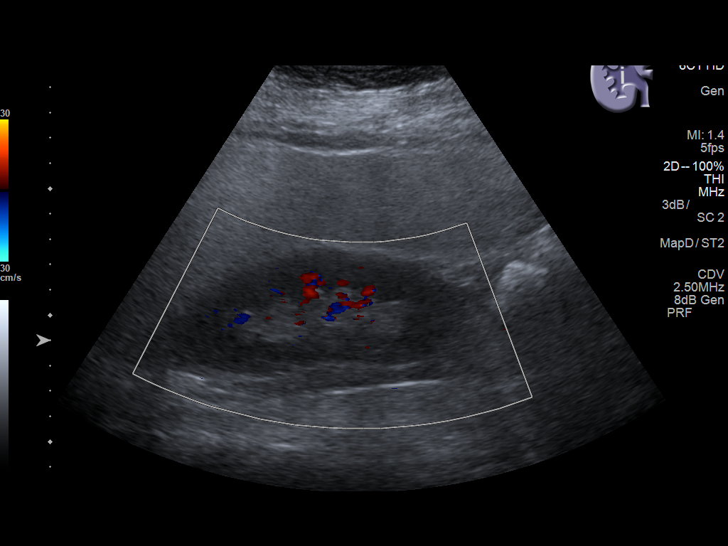
[im 85/128]
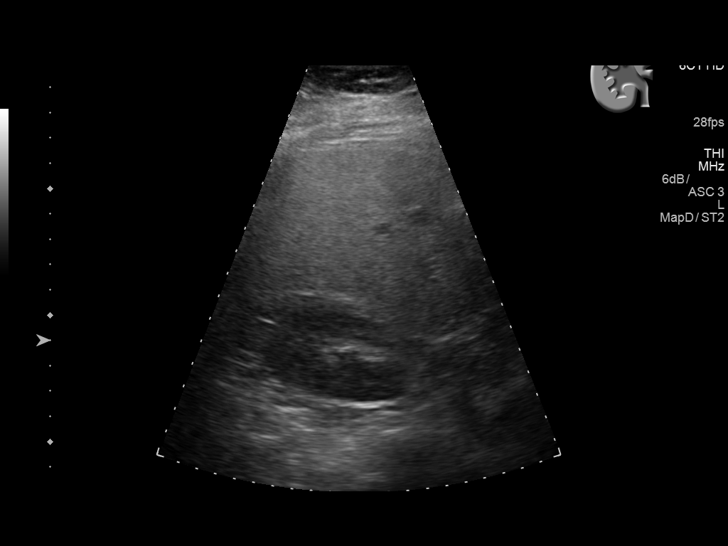
[im 96/128]
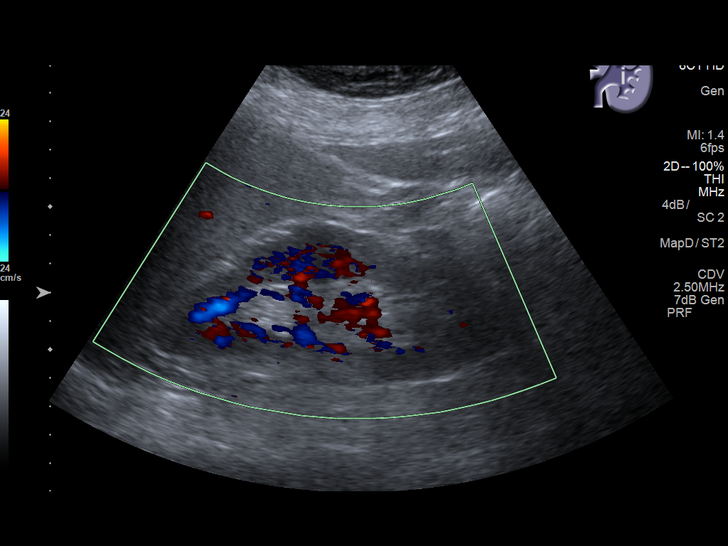
[im 106/128]
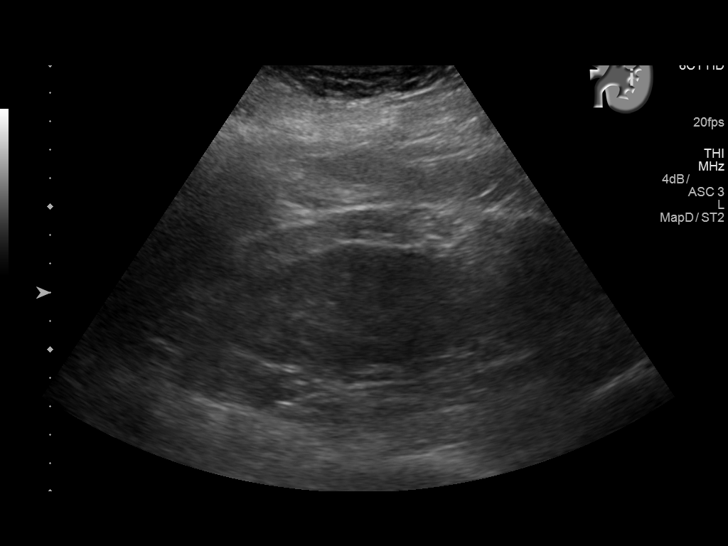
[im 117/128]
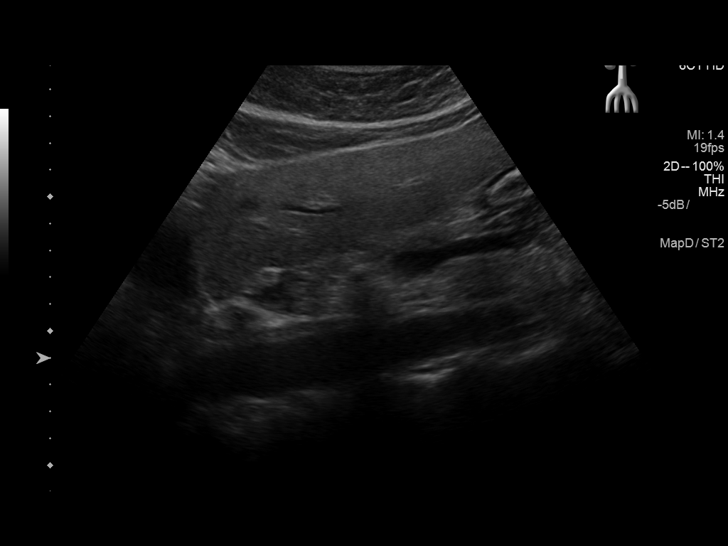
[im 128/128]
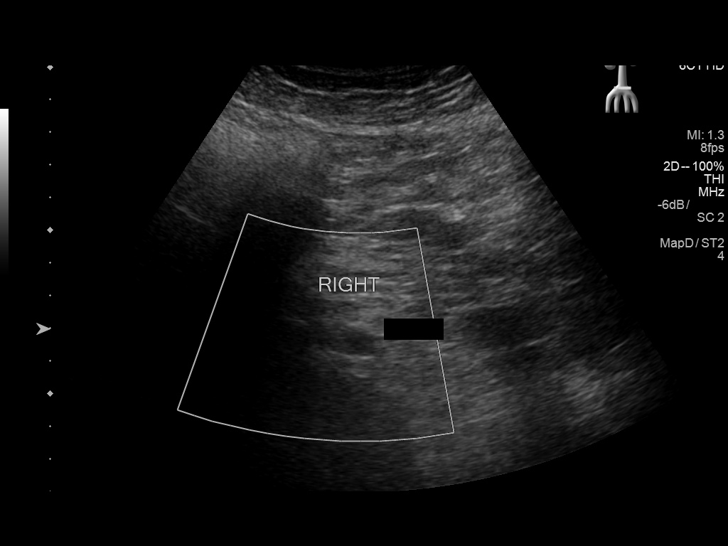

[13 of 25 positions shown; findings below may reference images not displayed]

FINDINGS: Gallbladder: The gallbladder surgically absent.

Common bile duct: Diameter: 13 mm. No abnormal intraluminal echoes
are observed.

Liver: The hepatic echotexture is mildly increased diffusely. The
surface contour of the liver is smooth. There is no focal mass nor
ductal dilation.

IVC: No abnormality visualized.

Pancreas: Visualized portion unremarkable.

Spleen: Size and appearance within normal limits.

Right Kidney: Length: 11.6 cm. Echogenicity within normal limits. No
mass or hydronephrosis visualized.

Left Kidney: Length: 12.1 cm. Echogenicity within normal limits. No
mass or hydronephrosis visualized.

Abdominal aorta: No aneurysm visualized.

Other findings: There is no ascites.
IMPRESSION: Increased hepatic echotexture compatible with fatty infiltrative
change. No focal mass or surface contour irregularity.

Previous cholecystectomy. Dilated common bile duct to 13 mm which is
similar to that seen previously. No significant intrahepatic ductal
dilation is observed.

No acute abnormality observed elsewhere within the abdomen.

## 2019-02-07 IMAGING — MG MM DIGITAL SCREENING BILAT W/ TOMO W/ CAD
8 of 12 series · 8 of 28 positions shown · non-contrast
Comparison: Previous exam(s).

CLINICAL DATA: Screening.

EXAM:
2D DIGITAL SCREENING BILATERAL MAMMOGRAM WITH CAD AND ADJUNCT TOMO

[R MLO]
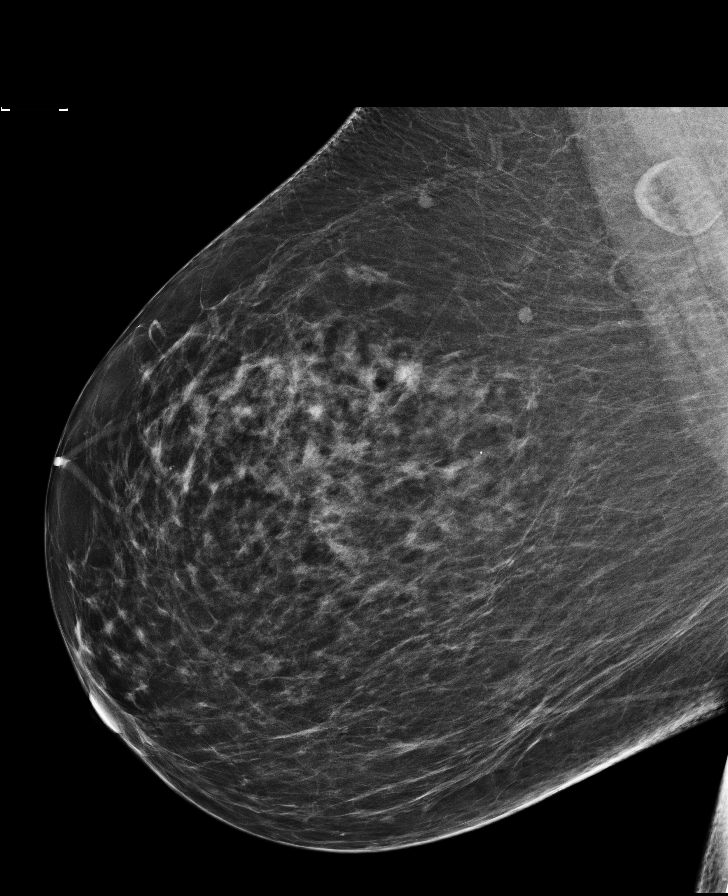

[L MLO]
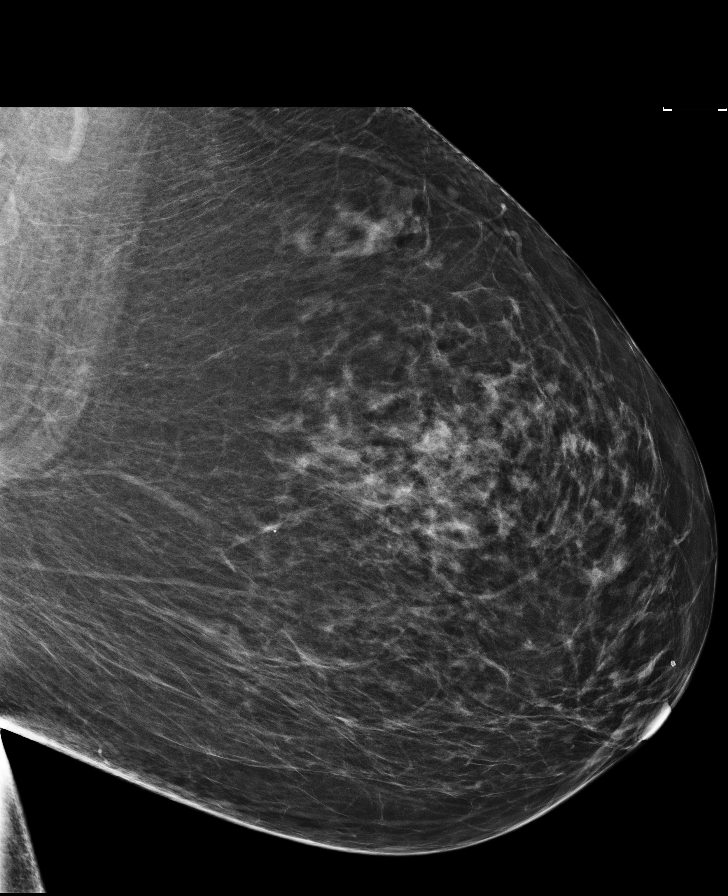

[L CC synth-2D]
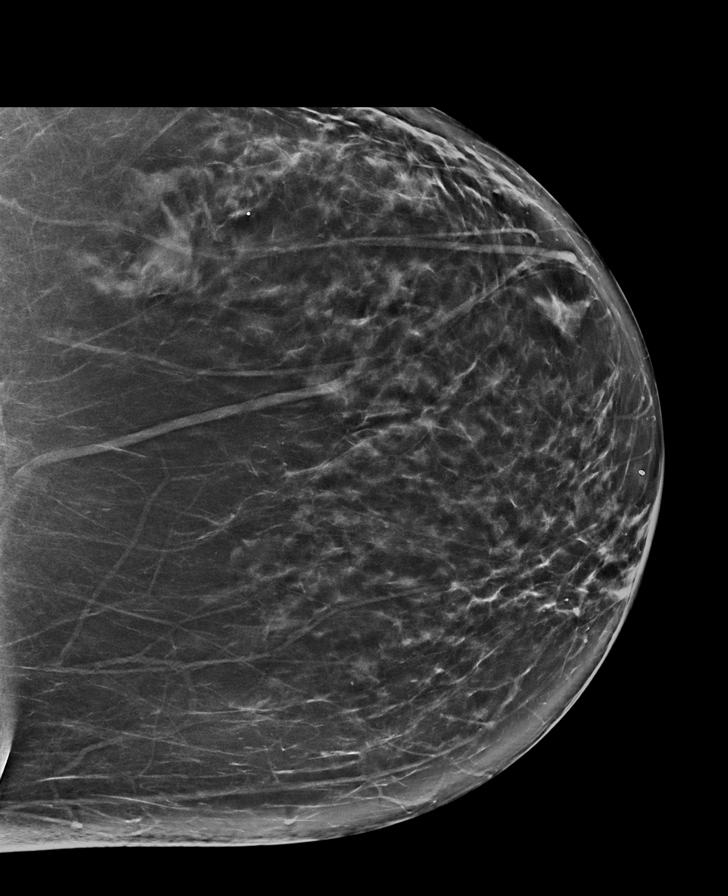

[R CC]
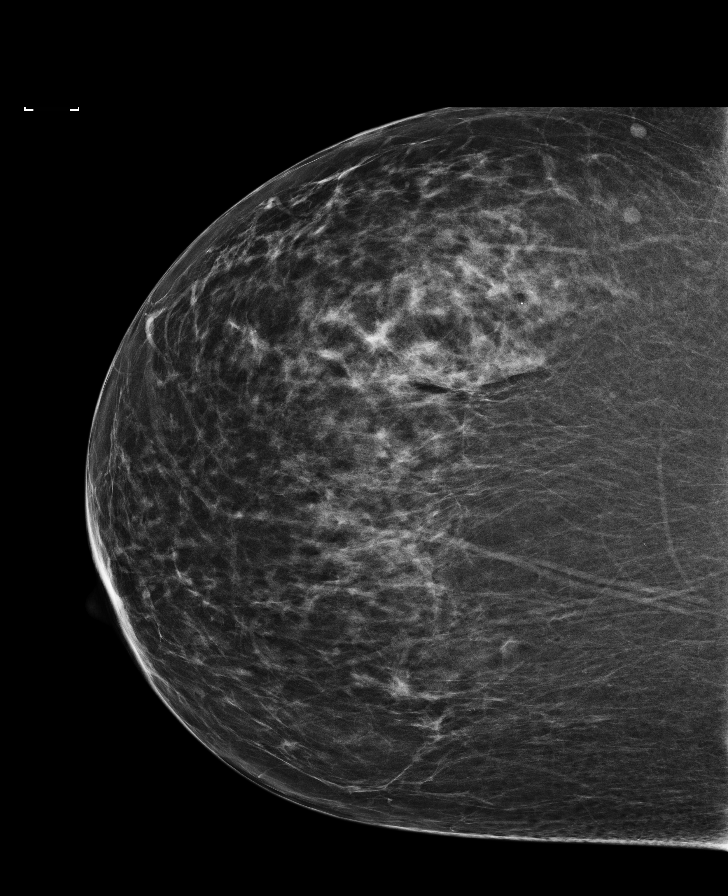

[R MLO synth-2D]
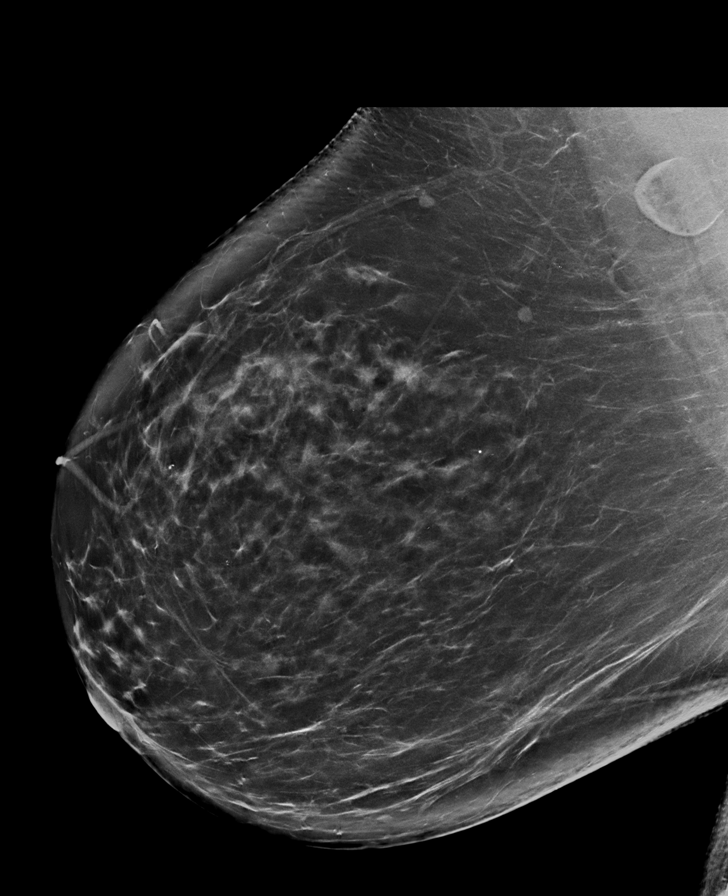

[R CC synth-2D]
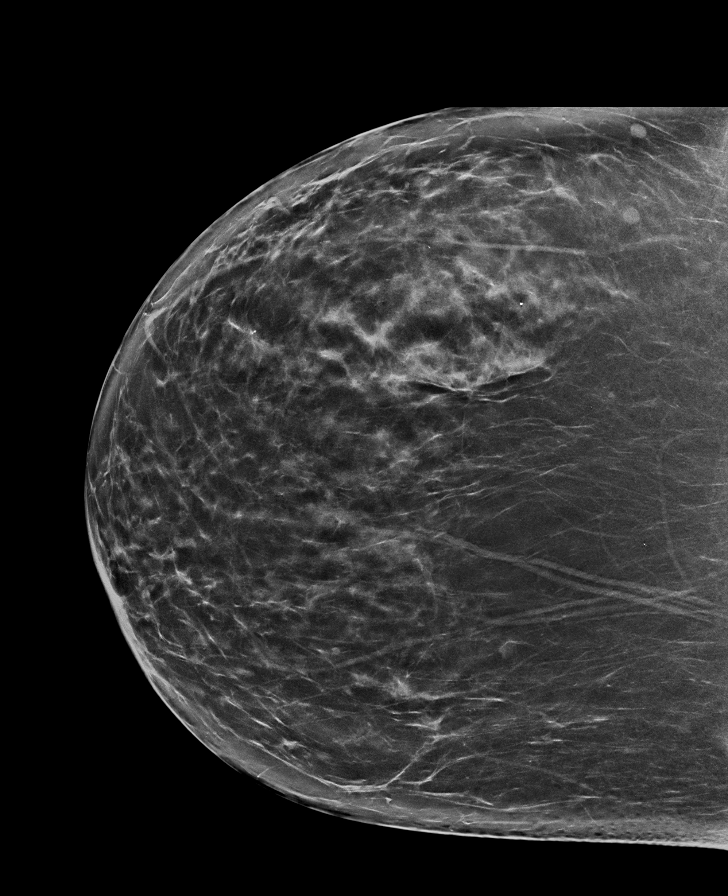

[L CC]
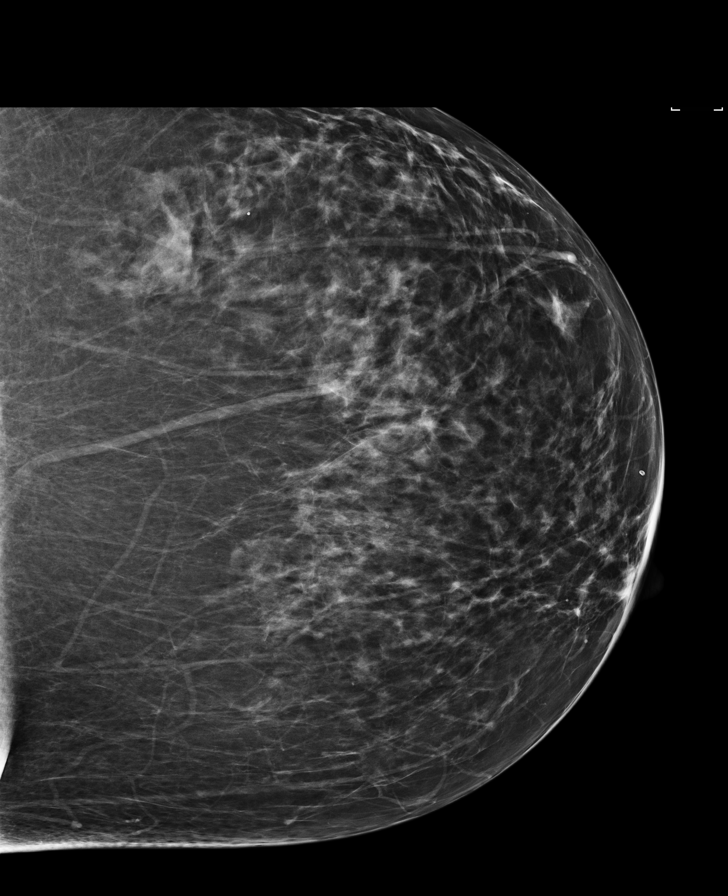

[L MLO synth-2D]
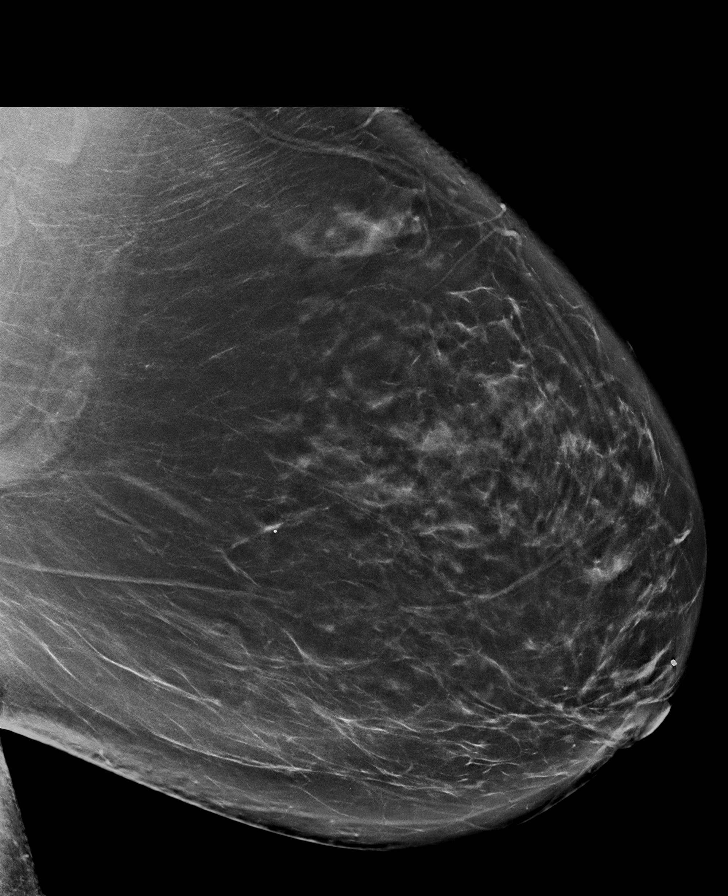

[8 of 28 positions shown; findings below may reference images not displayed]

ACR Breast Density Category b: There are scattered areas of
fibroglandular density.
FINDINGS: There are no findings suspicious for malignancy. Images were
processed with CAD.
IMPRESSION: No mammographic evidence of malignancy. A result letter of this
screening mammogram will be mailed directly to the patient.

RECOMMENDATION:
Screening mammogram in one year. (Code:97-6-RS4)

BI-RADS CATEGORY  1: Negative.

## 2019-02-18 ENCOUNTER — Other Ambulatory Visit: Payer: Self-pay | Admitting: Gastroenterology

## 2019-02-18 DIAGNOSIS — K760 Fatty (change of) liver, not elsewhere classified: Secondary | ICD-10-CM

## 2019-02-28 ENCOUNTER — Ambulatory Visit
Admission: RE | Admit: 2019-02-28 | Discharge: 2019-02-28 | Disposition: A | Discharge status: Home / Self Care | Payer: 59 | Source: Ambulatory Visit | Attending: Gastroenterology | Admitting: Gastroenterology

## 2019-02-28 ENCOUNTER — Other Ambulatory Visit: Payer: Self-pay

## 2019-02-28 DIAGNOSIS — K760 Fatty (change of) liver, not elsewhere classified: Secondary | ICD-10-CM | POA: Diagnosis present

## 2019-03-11 ENCOUNTER — Other Ambulatory Visit: Payer: Self-pay | Admitting: Internal Medicine

## 2019-03-21 ENCOUNTER — Ambulatory Visit
Admission: RE | Admit: 2019-03-21 | Discharge: 2019-03-21 | Disposition: A | Discharge status: Home / Self Care | Payer: 59 | Source: Ambulatory Visit | Attending: Family Medicine | Admitting: Family Medicine

## 2019-03-21 DIAGNOSIS — Z1231 Encounter for screening mammogram for malignant neoplasm of breast: Secondary | ICD-10-CM

## 2019-04-23 ENCOUNTER — Ambulatory Visit (INDEPENDENT_AMBULATORY_CARE_PROVIDER_SITE_OTHER): Payer: 59 | Admitting: Internal Medicine

## 2019-04-23 ENCOUNTER — Encounter: Payer: Self-pay | Admitting: Internal Medicine

## 2019-04-23 ENCOUNTER — Other Ambulatory Visit: Payer: Self-pay

## 2019-04-23 ENCOUNTER — Ambulatory Visit (INDEPENDENT_AMBULATORY_CARE_PROVIDER_SITE_OTHER): Payer: 59

## 2019-04-23 VITALS — BP 132/82 | HR 55 | Temp 97.3°F | Ht 62.5 in | Wt 236.0 lb

## 2019-04-23 DIAGNOSIS — F5101 Primary insomnia: Secondary | ICD-10-CM

## 2019-04-23 DIAGNOSIS — J449 Chronic obstructive pulmonary disease, unspecified: Secondary | ICD-10-CM | POA: Diagnosis not present

## 2019-04-23 MED ORDER — AMPHETAMINE-DEXTROAMPHETAMINE 10 MG PO TABS: ORAL_TABLET | ORAL | 0 refills | Status: DC

## 2019-04-23 MED ORDER — ALBUTEROL SULFATE HFA 108 (90 BASE) MCG/ACT IN AERS: 2.0000 | INHALATION_SPRAY | RESPIRATORY_TRACT | 12 refills | Status: DC | PRN

## 2019-04-23 NOTE — Assessment & Plan Note (Signed)
Minimal bronchitis symptoms. Notes DOB only with brisker activity and can pace herself. Rescue inhaler has been sufficient Plan - update CXR- former smoker.

## 2019-04-23 NOTE — Progress Notes (Signed)
HPI  female former smoker followed for nocturnal hypoxia/minimal OSA, history Narcolepsy Sleep Study 2016 - Dr Rexene Alberts. History of Narcolepsy - diagnosed 2001- tried and failed Ritalin and Provigil. NPSG  11/30/14- [facility] AHI 5.3/ hr, desat to 83%, weight 216 lbs Study in 2001 in [place], [place], with report no longer available, included MSLT and she says diagnosed narcolepsy without sleep apnea. The 2016 sleep study was supposed to have included an MST but that didn't get done and she didn't like her experience there. Takes Zanaflex for cervical radiculopathy pain and it helps her sleep .Previous trials of Provigil and Ritalin both said to cause HBP and "swelling". She ended up on Dexedrine for a while. Tried Mirapex for vaguely defined limb jerks. This caused a lot of malaise and nausea. NPSG 10/19/2015-AHI 2.4 per hour/WNL, desaturation to 81%, moderate snoring, severe PLMS with arousal 6.3/hour MSLT- 10/20/2015-mean latency 3 minutes 43 seconds, 0/5 SOREM    C/W idiopathic hypersomnia History of recurrent bronchitis and chronic cough until she quit smoking. Several previous pneumonias .PFT 12/08/2015-moderate obstructive airways disease, insignificant response to bronchodilator, normal total lung capacity. They could not get an accurate diffusion capacity ------------------------------------------------------------------------------------------------------  04/22/2018- 58 year old female former smoker followed for COPD, nocturnal hypoxia/ minimal OSA, history narcolepsy, insomnia, GERD/Barrett's, Follows for: COPD Pro-air HFA, Zyrtec, Singulair Surgery at [facility] for parathyroidectomy/partial thyroidectomy, complicated by injury to vocal cord and hematoma.  All this has finally resolved.  She sleeps on an adjustable bed with head elevated, and her own room.  Aware of some restlessness.  Some dyspnea on exertion with little wheeze or cough.  Swallows without problem.  Less daytime  sleepiness now that she is on Synthroid. Needs albuterol refilled.  Had flu shot. CXR 04/20/2017 IMPRESSION: Chronic bronchitic changes.  No active cardiopulmonary disease.  04/23/2019- 58 year old female former smoker followed for COPD, nocturnal hypoxia/ minimal OSA, history narcolepsy, insomnia, GERD/Barrett's, Hx parathyroidectomy/partial thyroidectomy -----pt reports usual DOE, denies recent flare-ups Body weight today 236 lbs Does not have O2 Only rarely needs rescue inhaler. Rare cough, no wheeze. Covid careful- discussed. Sleep- follows with Aple watch, Sleep Number bed. No longer working with Neurology.Husband complians she sleeps too much. Retired so sleep hours flexible. Remotely ritalin helped, but may have had allergic reaction.Didn't like Provigil. No cataplexy.  ROS-see HPI  + = positive Constitutional:    weight loss, night sweats, fevers, chills, + fatigue, lassitude. HEENT:    headaches, difficulty swallowing, tooth/dental problems, sore throat,       sneezing, itching, ear ache, nasal congestion, post nasal drip, snoring CV:    chest pain, orthopnea, PND, swelling in lower extremities, anasarca,                                                     dizziness, palpitations Resp:   shortness of breath with exertion or at rest.                productive cough,   non-productive cough, coughing up of blood.              change in color of mucus.  wheezing.   Skin:    rash or lesions. GI:  No-   heartburn, indigestion, abdominal pain, nausea, vomiting, diarrhea,                 change  in bowel habits, loss of appetite GU: dysuria, change in color of urine, no urgency or frequency.   flank pain. MS:   joint pain, stiffness, decreased range of motion, + back pain. Neuro-     nothing unusual Psych:  change in mood or affect.  depression or anxiety.   memory loss.  OBJ- Physical Exam General- Alert, Oriented, Affect-appropriate, Distress- none acute, + obese Skin- rash-none,  lesions- none, excoriation- none Lymphadenopathy- none Head- atraumatic            Eyes- Gross vision intact, PERRLA, conjunctivae and secretions clear            Ears- Hearing, canals-normal            Nose- Clear, no-Septal dev, mucus, polyps, erosion, perforation             Throat- Mallampati III , mucosa clear , drainage- none, tonsils- atrophic Neck- flexible , trachea midline, no stridor , thyroid nl, carotid no bruit Chest - symmetrical excursion , unlabored           Heart/CV- RRR , no murmur , no gallop  , no rub, nl s1 s2                           - JVD- none , edema- none, stasis changes- none, varices- none           Lung- clear to P&A, wheeze- none, cough- none , dullness-none, rub- none           Chest wall-  Abd-  Br/ Gen/ Rectal- Not done, not indicated Extrem- cyanosis- none, clubbing, none, atrophy- none, strength- nl Neuro- grossly intact to observation

## 2019-04-23 NOTE — Assessment & Plan Note (Addendum)
Some difficulty falling asleep, Husband actually complains that she sleeps too much.  We will try to address this aspect first, with Aderall to improve night time sleep. Sleep apnea was minimal in the past. Neurology had considered narcolepsy. Plan Adderall trial. Discussion done, questions answered.

## 2019-04-23 NOTE — Patient Instructions (Signed)
Order- CXR     Dx COPD mixed type  Script sent for adderall to use if needed for sleepiness  Ok to use albuterol rescue inhaler up to every 6 hours, if needed.  Please call if we can help

## 2019-08-15 IMAGING — DX DG CHEST 2V
2 series · 2 of 2 positions shown · non-contrast
Comparison: Chest x-ray dated July 18, 2016.

CLINICAL DATA: COPD.

EXAM:
CHEST  2 VIEW

[chest pa]
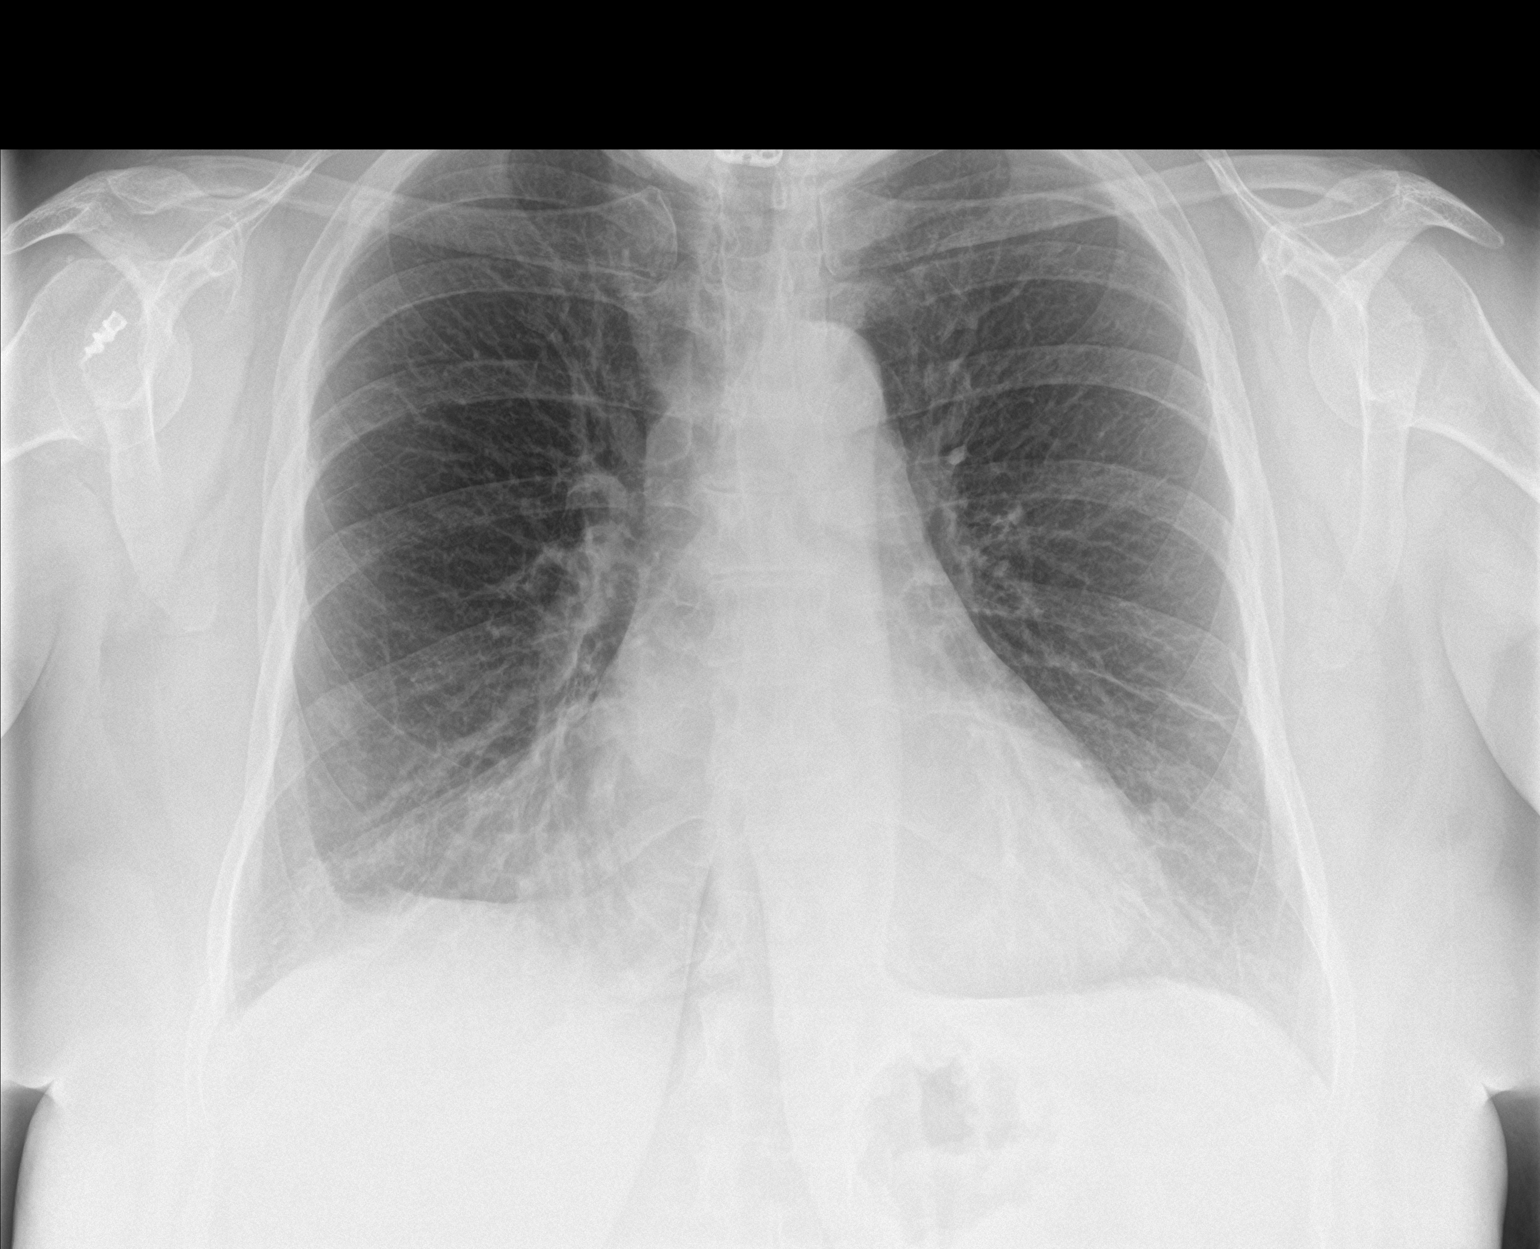

[chest lat]
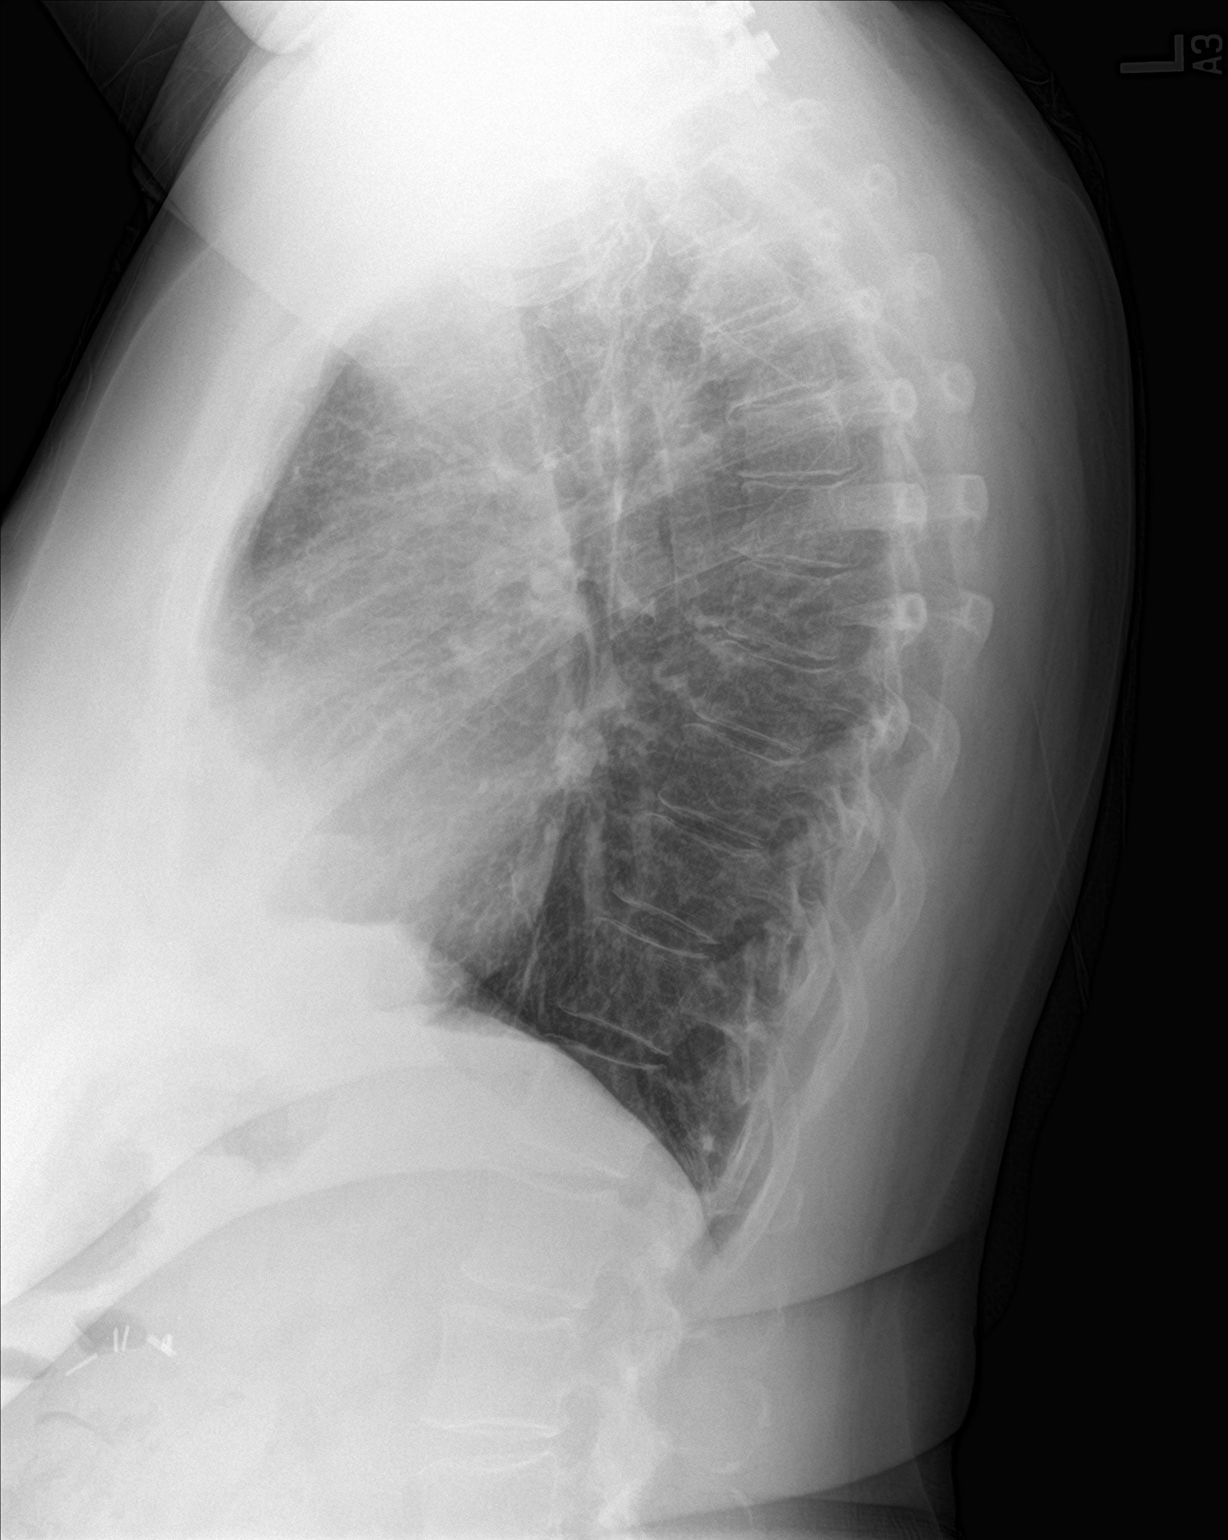

[2 of 2 positions shown; findings below may reference images not displayed]

FINDINGS: Borderline cardiomegaly. Normal pulmonary vascularity. Stable
scarring at the right lung base. Chronic, mildly coarsened
interstitial markings. No focal consolidation, pleural effusion, or
pneumothorax. No acute osseous abnormality. Partially visualized
cervical spine fusion hardware. Prior right rotator cuff repair.
IMPRESSION: Chronic bronchitic changes.  No active cardiopulmonary disease.

## 2019-08-26 ENCOUNTER — Ambulatory Visit (INDEPENDENT_AMBULATORY_CARE_PROVIDER_SITE_OTHER): Payer: Medicare Other | Admitting: Internal Medicine

## 2019-08-26 ENCOUNTER — Telehealth: Payer: Self-pay | Admitting: Internal Medicine

## 2019-08-26 ENCOUNTER — Encounter: Payer: Self-pay | Admitting: Internal Medicine

## 2019-08-26 ENCOUNTER — Other Ambulatory Visit: Payer: Self-pay

## 2019-08-26 DIAGNOSIS — F5101 Primary insomnia: Secondary | ICD-10-CM

## 2019-08-26 DIAGNOSIS — G47419 Narcolepsy without cataplexy: Secondary | ICD-10-CM

## 2019-08-26 DIAGNOSIS — J449 Chronic obstructive pulmonary disease, unspecified: Secondary | ICD-10-CM | POA: Diagnosis not present

## 2019-08-26 MED ORDER — LISDEXAMFETAMINE DIMESYLATE 10 MG PO CAPS: ORAL_CAPSULE | ORAL | 0 refills | Status: DC

## 2019-08-26 MED ORDER — SUNOSI 75 MG PO TABS: 0.5000 | ORAL_TABLET | Freq: Every day | ORAL | 5 refills | Status: DC

## 2019-08-26 NOTE — Progress Notes (Signed)
HPI  female former smoker followed for nocturnal hypoxia/minimal OSA, history Narcolepsy Sleep Study 2016 - Dr Rexene Alberts. History of Narcolepsy - diagnosed 2001- tried and failed Ritalin and Provigil. NPSG  11/30/14- [facility] AHI 5.3/ hr, desat to 83%, weight 216 lbs Study in 2001 in [place], [place], with report no longer available, included MSLT and she says diagnosed narcolepsy without sleep apnea. The 2016 sleep study was supposed to have included an MST but that didn't get done and she didn't like her experience there. Takes Zanaflex for cervical radiculopathy pain and it helps her sleep .Previous trials of Provigil and Ritalin both said to cause HBP and "swelling". She ended up on Dexedrine for a while. Tried Mirapex for vaguely defined limb jerks. This caused a lot of malaise and nausea. NPSG 10/19/2015-AHI 2.4 per hour/WNL, desaturation to 81%, moderate snoring, severe PLMS with arousal 6.3/hour MSLT- 10/20/2015-mean latency 3 minutes 43 seconds, 0/5 SOREM    C/W idiopathic hypersomnia History of recurrent bronchitis and chronic cough until she quit smoking. Several previous pneumonias .PFT 12/08/2015-moderate obstructive airways disease, insignificant response to bronchodilator, normal total lung capacity. They could not get an accurate diffusion capacity ------------------------------------------------------------------------------------------------------  04/23/2019- 59 year old female former smoker followed for COPD, nocturnal hypoxia/ minimal OSA, history narcolepsy, insomnia, GERD/Barrett's, Hx parathyroidectomy/partial thyroidectomy/  -----pt reports usual DOE, denies recent flare-ups Body weight today 236 lbs Does not have O2 Only rarely needs rescue inhaler. Rare cough, no wheeze. Covid careful- discussed. Sleep- follows with [employer] [facility], Sleep Number bed. No longer working with Neurology.Husband complians she sleeps too much. Retired so sleep hours flexible. Remotely  ritalin helped, but may have had allergic reaction.Didn't like Provigil. No cataplexy.  08/26/19- Virtual Visit via Telephone Note  I connected with Emily Melton on 08/26/19 at 11:00 AM EST by telephone and verified that I am speaking with the correct person using two identifiers.  Location: Patient: H Provider: O   I discussed the limitations, risks, security and privacy concerns of performing an evaluation and management service by telephone and the availability of in person appointments. I also discussed with the patient that there may be a patient responsible charge related to this service. The patient expressed understanding and agreed to proceed.   History of Present Illness: 59 year old female former smoker( 30 pk yr) followed for COPD, nocturnal hypoxia/ minimal OSA, history narcolepsy, insomnia, GERD/Barrett's, Hx parathyroidectomy/partial thyroidectomy/  (Remotely ritalin helped, but may have had allergic reaction.Didn't like Provigil. No cataplexy.) Now questions if adderall gives headache. Does not have CPAP or O2 Had flu vax, no covid vax  albuterol hfa, singulair, adderall 10       Note Inderal   Per Dr Reginia Forts No respiratory exacerb, routine cough or wheeze. Rarely needs rescue inhaler.  Observations/Objective:   Assessment and Plan: COPD mixed type, moderate, uncomplicated Nocturnal hypoxemia- consider repeat ONOX Narcolepsy/ hypersomnia w/o cataplexy- medications poorly tolerated. Consider Vyvanse trial  Follow Up Instructions: 6 months   I discussed the assessment and treatment plan with the patient. The patient was provided an opportunity to ask questions and all were answered. The patient agreed with the plan and demonstrated an understanding of the instructions.   The patient was advised to call back or seek an in-person evaluation if the symptoms worsen or if the condition fails to improve as anticipated.  I provided 21 minutes of non-face-to-face  time during this encounter.   Baird Lyons, MD    ROS-see HPI  + = positive Constitutional:  weight loss, night sweats, fevers, chills, + fatigue, lassitude. HEENT:    headaches, difficulty swallowing, tooth/dental problems, sore throat,       sneezing, itching, ear ache, nasal congestion, post nasal drip, snoring CV:    chest pain, orthopnea, PND, swelling in lower extremities, anasarca,                                                     dizziness, palpitations Resp:   shortness of breath with exertion or at rest.                productive cough,   non-productive cough, coughing up of blood.              change in color of mucus.  wheezing.   Skin:    rash or lesions. GI:  No-   heartburn, indigestion, abdominal pain, nausea, vomiting, diarrhea,                 change in bowel habits, loss of appetite GU: dysuria, change in color of urine, no urgency or frequency.   flank pain. MS:   joint pain, stiffness, decreased range of motion, + back pain. Neuro-     nothing unusual Psych:  change in mood or affect.  depression or anxiety.   memory loss.  OBJ- Physical Exam General- Alert, Oriented, Affect-appropriate, Distress- none acute, + obese Skin- rash-none, lesions- none, excoriation- none Lymphadenopathy- none Head- atraumatic            Eyes- Gross vision intact, PERRLA, conjunctivae and secretions clear            Ears- Hearing, canals-normal            Nose- Clear, no-Septal dev, mucus, polyps, erosion, perforation             Throat- Mallampati III , mucosa clear , drainage- none, tonsils- atrophic Neck- flexible , trachea midline, no stridor , thyroid nl, carotid no bruit Chest - symmetrical excursion , unlabored           Heart/CV- RRR , no murmur , no gallop  , no rub, nl s1 s2                           - JVD- none , edema- none, stasis changes- none, varices- none           Lung- clear to P&A, wheeze- none, cough- none , dullness-none, rub- none           Chest wall-   Abd-  Br/ Gen/ Rectal- Not done, not indicated Extrem- cyanosis- none, clubbing, none, atrophy- none, strength- nl Neuro- grossly intact to observation

## 2019-08-26 NOTE — Patient Instructions (Signed)
Ok to continue occasional use of albuterol rescue inhaler for breathing, since that seems sufficient for now.  Instead of adderall, I have sent a prescription for Sunosi to try. Start with 1/2 tablet in the morning ( 37.5 mg).  If needed and tolerated, you can increase to 1 whole tablet (75 mg) once daily.  Any stimulant type medicine may raise your blood pressure some.   Discuss with your primary physician the possibility that your nonselective beta blocker propranolol (Inderal), may make you feel tired, and may tighten your airways some. Perhaps she might consider trying a more selective beta blocker like metoprolol.   Please call as needed

## 2019-08-26 NOTE — Telephone Encounter (Signed)
I spoke with the patient and made her aware that I would make Dr. Annamaria Boots aware of this and get back with her once we have his recommendations.   Dr. Annamaria Boots please advise

## 2019-08-26 NOTE — Telephone Encounter (Signed)
I called and spoke with the patient and made her aware.

## 2019-08-26 NOTE — Telephone Encounter (Signed)
D/C Sunosi I will send script for Vyvanse so she can price-check that.

## 2019-09-07 ENCOUNTER — Encounter: Payer: Self-pay | Admitting: Internal Medicine

## 2019-09-07 DIAGNOSIS — G47419 Narcolepsy without cataplexy: Secondary | ICD-10-CM | POA: Insufficient documentation

## 2019-09-07 NOTE — Assessment & Plan Note (Signed)
Uncomplicated. Only occasional need for rescue inhaler, no maintenance controller. Inderal seems well tolerated for now, but it would be interesting to see how her breathing feels if it could be changed to something more selective. Plan- no change

## 2019-09-07 NOTE — Assessment & Plan Note (Signed)
Says zanaflex helps her sleep

## 2019-09-07 NOTE — Assessment & Plan Note (Signed)
Management is the same if considered Idiopathic Hypersomnia or Narcolepsy w/o cataplexy. Need for good sleep hygiene, naps, safe driving responsibility. Has not tolerated ritalin or Provigil and Ritalin, now c/o HA from low dose adderall.  Plan- Try Sunosi or Vyvanse if insurance will cover.

## 2019-10-16 ENCOUNTER — Ambulatory Visit: Payer: Medicare Other | Attending: Internal Medicine

## 2019-10-16 DIAGNOSIS — Z23 Encounter for immunization: Secondary | ICD-10-CM

## 2019-10-16 NOTE — Progress Notes (Signed)
   Covid-19 Vaccination Clinic  Name:  Gurpreet Mariani    MRN: [id number] DOB: 1961/02/11  10/16/2019  Ms. Madej was observed post Covid-19 immunization for 30 minutes based on pre-vaccination screening without incident. She was provided with Vaccine Information Sheet and instruction to access the V-Safe system.   Ms. Diemer was instructed to call 911 with any severe reactions post vaccine: Marland Kitchen Difficulty breathing  . Swelling of face and throat  . A fast heartbeat  . A bad rash all over body  . Dizziness and weakness   Immunizations Administered    Name Date Dose VIS Date Route   Pfizer COVID-19 Vaccine 10/16/2019 10:00 AM 0.3 mL 06/27/2019 Intramuscular   Manufacturer: [place]   Lot: [id number]   [place]: [id number]

## 2019-11-11 ENCOUNTER — Ambulatory Visit: Payer: Medicare Other | Attending: Internal Medicine

## 2019-11-11 DIAGNOSIS — Z23 Encounter for immunization: Secondary | ICD-10-CM

## 2019-11-11 NOTE — Progress Notes (Signed)
   Covid-19 Vaccination Clinic  Name:  Emily Melton    MRN: [id number] DOB: 02-06-1961  11/11/2019  [name] was observed post Covid-19 immunization for 30 minutes based on pre-vaccination screening without incident. She was provided with Vaccine Information Sheet and instruction to access the V-Safe system.   [name] was instructed to call 911 with any severe reactions post vaccine: Marland Kitchen Difficulty breathing  . Swelling of face and throat  . A fast heartbeat  . A bad rash all over body  . Dizziness and weakness   Immunizations Administered    Name Date Dose VIS Date Route   Pfizer COVID-19 Vaccine 11/11/2019 10:13 AM 0.3 mL 09/10/2018 Intramuscular   Manufacturer: [employer]   Lot: [id number]   [place]: [id number]

## 2020-02-23 ENCOUNTER — Ambulatory Visit: Payer: Medicare Other | Admitting: Internal Medicine

## 2020-03-31 ENCOUNTER — Ambulatory Visit: Payer: Medicare Other | Admitting: Internal Medicine

## 2020-03-31 ENCOUNTER — Other Ambulatory Visit: Payer: Self-pay

## 2020-03-31 ENCOUNTER — Encounter: Payer: Self-pay | Admitting: Internal Medicine

## 2020-03-31 VITALS — BP 134/72 | HR 55 | Temp 97.5°F | Ht 62.0 in | Wt 247.2 lb

## 2020-03-31 DIAGNOSIS — Z23 Encounter for immunization: Secondary | ICD-10-CM

## 2020-03-31 DIAGNOSIS — J449 Chronic obstructive pulmonary disease, unspecified: Secondary | ICD-10-CM | POA: Diagnosis not present

## 2020-03-31 DIAGNOSIS — G47419 Narcolepsy without cataplexy: Secondary | ICD-10-CM

## 2020-03-31 NOTE — Patient Instructions (Signed)
Order- flu vax standard  Ok to continue current meds and let us know if you need refills  Please call if we can help

## 2020-03-31 NOTE — Progress Notes (Signed)
HPI  female former smoker followed for nocturnal hypoxia/minimal OSA, history Narcolepsy Sleep Study 2016 - Dr Rexene Alberts. History of Narcolepsy - diagnosed 2001- tried and failed Ritalin and Provigil. NPSG  11/30/14- [facility] AHI 5.3/ hr, desat to 83%, weight 216 lbs Study in 2001 in [place], [place], with report no longer available, included MSLT and she says diagnosed narcolepsy without sleep apnea. The 2016 sleep study was supposed to have included an MST but that didn't get done and she didn't like her experience there. Takes Zanaflex for cervical radiculopathy pain and it helps her sleep .Previous trials of Provigil and Ritalin both said to cause HBP and "swelling". She ended up on Dexedrine for a while. Tried Mirapex for vaguely defined limb jerks. This caused a lot of malaise and nausea. NPSG 10/19/2015-AHI 2.4 per hour/WNL, desaturation to 81%, moderate snoring, severe PLMS with arousal 6.3/hour MSLT- 10/20/2015-mean latency 3 minutes 43 seconds, 0/5 SOREM    C/W idiopathic hypersomnia History of recurrent bronchitis and chronic cough until she quit smoking. Several previous pneumonias .PFT 12/08/2015-moderate obstructive airways disease, insignificant response to bronchodilator, normal total lung capacity. They could not get an accurate diffusion capacity ------------------------------------------------------------------------------------------------------   08/26/19- Virtual Visit via Telephone Note  History of Present Illness: 59 year old female former smoker( 30 pk yr) followed for COPD, nocturnal hypoxia/ minimal OSA, history narcolepsy, insomnia, GERD/Barrett's, Hx parathyroidectomy/partial thyroidectomy/  (Remotely ritalin helped, but may have had allergic reaction.Didn't like Provigil. No cataplexy.) Now questions if adderall gives headache. Does not have CPAP or O2 Had flu vax, no covid vax  albuterol hfa, singulair, adderall 10       Note Inderal   Per Dr Reginia Forts No respiratory exacerb, routine cough or wheeze. Rarely needs rescue inhaler. We tried Sunosi- not covered by Medicare. Tried Vyvanse 10, 1 qAM.    Observations/Objective:   Assessment and Plan: COPD mixed type, moderate, uncomplicated Nocturnal hypoxemia- consider repeat ONOX Narcolepsy/ hypersomnia w/o cataplexy- medications poorly tolerated. Consider Vyvanse trial  Follow Up Instructions: 6 months    03/31/20- 59 year old female former smoker( 30 pk yr) followed for COPD, nocturnal hypoxia/ minimal OSA, history narcolepsy, insomnia, GERD/Barrett's, Hx parathyroidectomy/partial thyroidectomy/  (Remotely ritalin helped, but may have had allergic reaction.Didn't like Provigil. No cataplexy.) Now questions if adderall gives headache. Does not have CPAP or O2 Covid vax- 2 Phizer Tried Vyvanse 10 mg qAM-  Singulair. Ventolin hfa,  -----copd,sob only with exertion She reports doing well. Vyvanse is slow onset, up to 2 hours, but works, w/o side effects of other meds tried. She says she did well in past with dexedrine, so we may try that next. Has only used a few Vyvanse since prescribed. Naps 2-3x/ week. Not much cough.since quit smoking.  CXR 04/23/2019-  IMPRESSION: Chronic bronchitic changes without evidence of acute cardiopulmonary process.  ROS-see HPI  + = positive Constitutional:    weight loss, night sweats, fevers, chills, + fatigue, lassitude. HEENT:    headaches, difficulty swallowing, tooth/dental problems, sore throat,       sneezing, itching, ear ache, nasal congestion, post nasal drip, snoring CV:    chest pain, orthopnea, PND, swelling in lower extremities, anasarca,                                                     dizziness, palpitations Resp:   shortness  of breath with exertion or at rest.                +roductive cough,   non-productive cough, coughing up of blood.              change in color of mucus.  wheezing.   Skin:    rash or lesions. GI:  No-    heartburn, indigestion, abdominal pain, nausea, vomiting, diarrhea,                 change in bowel habits, loss of appetite GU: dysuria, change in color of urine, no urgency or frequency.   flank pain. MS:   joint pain, stiffness, decreased range of motion, + back pain. Neuro-     nothing unusual Psych:  change in mood or affect.  depression or anxiety.   memory loss.  OBJ- Physical Exam General- Alert, Oriented, Affect-appropriate, Distress- none acute, + obese Skin- rash-none, lesions- none, excoriation- none Lymphadenopathy- none Head- atraumatic            Eyes- Gross vision intact, PERRLA, conjunctivae and secretions clear            Ears- Hearing, canals-normal            Nose- Clear, no-Septal dev, mucus, polyps, erosion, perforation             Throat- Mallampati III , mucosa clear , drainage- none, tonsils- atrophic Neck- flexible , trachea midline, no stridor , thyroid nl, carotid no bruit Chest - symmetrical excursion , unlabored           Heart/CV- RRR , no murmur , no gallop  , no rub, nl s1 s2                           - JVD- none , edema- none, stasis changes- none, varices- none           Lung- clear to P&A, wheeze- none, cough- none , dullness-none, rub- none           Chest wall-  Abd-  Br/ Gen/ Rectal- Not done, not indicated Extrem- cyanosis- none, clubbing, none, atrophy- none, strength- nl Neuro- grossly intact to observation

## 2020-03-31 NOTE — Assessment & Plan Note (Signed)
She feels comfortable and adequately controlled with only occ use of rescue inhaler. Plan- watch, but continue present med.

## 2020-03-31 NOTE — Assessment & Plan Note (Signed)
Vyvanse works well enough. We discussed possible trial of dexedrine in the future. It is remembered as working well without side effect issues.

## 2020-06-15 ENCOUNTER — Other Ambulatory Visit: Payer: Self-pay | Admitting: Internal Medicine

## 2020-06-18 ENCOUNTER — Other Ambulatory Visit: Payer: Self-pay | Admitting: Internal Medicine

## 2020-06-18 ENCOUNTER — Ambulatory Visit: Payer: Medicare Other | Attending: Internal Medicine

## 2020-06-18 DIAGNOSIS — Z23 Encounter for immunization: Secondary | ICD-10-CM

## 2020-06-18 NOTE — Progress Notes (Signed)
   Covid-19 Vaccination Clinic  Name:  Emily Melton    MRN: [id number] DOB: 15-Sep-1960  06/18/2020  Emily Melton was observed post Covid-19 immunization for 15 minutes without incident. She was provided with Vaccine Information Sheet and instruction to access the V-Safe system.   Emily Melton was instructed to call 911 with any severe reactions post vaccine: Marland Kitchen Difficulty breathing  . Swelling of face and throat  . A fast heartbeat  . A bad rash all over body  . Dizziness and weakness   Immunizations Administered    Name Date Dose VIS Date Route   Pfizer COVID-19 Vaccine 06/18/2020  1:07 PM 0.3 mL 05/05/2020 Intramuscular   Manufacturer: [place]   Lot: [id number]   [place]: [id number]

## 2020-10-25 ENCOUNTER — Telehealth: Payer: Self-pay | Admitting: *Deleted

## 2020-10-25 DIAGNOSIS — Z122 Encounter for screening for malignant neoplasm of respiratory organs: Secondary | ICD-10-CM

## 2020-10-25 DIAGNOSIS — Z87891 Personal history of nicotine dependence: Secondary | ICD-10-CM

## 2020-10-25 NOTE — Telephone Encounter (Signed)
Patient returned my call to schedule lung screening CT. Patient confirmed smoking history,(former smoker, quit 11/2013, 1 ppd x 36 years) as well as answering questions related to screening process. Patient denies signs of lung cancer such as weight loss or hemoptysis. Patient denies comorbidity that would prevent curative treatment if lung cancer were found. Patient is scheduled for shared decision making visit and CT scan on 11/24/20 @ 10:15 am.

## 2020-10-25 NOTE — Telephone Encounter (Signed)
Received referral for low dose lung cancer screening CT scan. Message left at phone number listed in EMR for patient to call me back to facilitate scheduling scan.  

## 2020-10-25 NOTE — Addendum Note (Signed)
Addended by: Livia Snellen on: 10/25/2020 10:54 AM   Modules accepted: Orders

## 2020-11-22 ENCOUNTER — Telehealth: Payer: Self-pay | Admitting: Internal Medicine

## 2020-11-22 MED ORDER — DEXTROAMPHETAMINE SULFATE ER 10 MG PO CP24: ORAL_CAPSULE | ORAL | 0 refills | Status: DC

## 2020-11-22 NOTE — Telephone Encounter (Signed)
Script sent for dexedrine to try instead of Vyvanse

## 2020-11-22 NOTE — Telephone Encounter (Signed)
I called and spoke with patient who is requesting a script for dexedrine be sent into pharmacy. Patient has finished Vyvanse and spoke with Dr. Annamaria Boots about dexedrine after finishing Vyvanse. Told patient will route to Dr. Annamaria Boots and follow up.  Dr. Annamaria Boots, please advise. Thanks!

## 2020-11-23 NOTE — Telephone Encounter (Signed)
I have called the pt and she is aware of the rx that has been sent to the pharmacy for the dexedrine.  Nothing further is needed.

## 2020-11-24 ENCOUNTER — Telehealth: Payer: Medicare Other | Admitting: Oncology

## 2020-11-24 ENCOUNTER — Ambulatory Visit
Admission: RE | Admit: 2020-11-24 | Discharge: 2020-11-24 | Disposition: A | Discharge status: Home / Self Care | Payer: Medicare Other | Source: Ambulatory Visit | Attending: Oncology | Admitting: Oncology

## 2020-11-24 ENCOUNTER — Other Ambulatory Visit: Payer: Self-pay

## 2020-11-24 DIAGNOSIS — Z122 Encounter for screening for malignant neoplasm of respiratory organs: Secondary | ICD-10-CM | POA: Insufficient documentation

## 2020-11-24 DIAGNOSIS — Z87891 Personal history of nicotine dependence: Secondary | ICD-10-CM | POA: Diagnosis not present

## 2020-12-01 ENCOUNTER — Encounter: Payer: Self-pay | Admitting: *Deleted

## 2020-12-02 ENCOUNTER — Other Ambulatory Visit: Payer: Self-pay | Admitting: Gastroenterology

## 2020-12-02 ENCOUNTER — Other Ambulatory Visit (HOSPITAL_COMMUNITY): Payer: Self-pay | Admitting: Gastroenterology

## 2020-12-02 DIAGNOSIS — K760 Fatty (change of) liver, not elsewhere classified: Secondary | ICD-10-CM

## 2020-12-06 ENCOUNTER — Other Ambulatory Visit: Payer: Self-pay | Admitting: Gastroenterology

## 2020-12-06 DIAGNOSIS — K760 Fatty (change of) liver, not elsewhere classified: Secondary | ICD-10-CM

## 2020-12-21 ENCOUNTER — Other Ambulatory Visit: Payer: Self-pay | Admitting: Internal Medicine

## 2020-12-21 ENCOUNTER — Other Ambulatory Visit: Payer: Self-pay | Admitting: Family Medicine

## 2020-12-21 DIAGNOSIS — Z1231 Encounter for screening mammogram for malignant neoplasm of breast: Secondary | ICD-10-CM

## 2021-01-06 ENCOUNTER — Ambulatory Visit: Payer: Medicare Other

## 2021-01-11 ENCOUNTER — Other Ambulatory Visit: Payer: Self-pay

## 2021-01-11 ENCOUNTER — Ambulatory Visit: Payer: Medicare Other

## 2021-01-11 ENCOUNTER — Ambulatory Visit: Payer: Medicare Other | Admitting: Dermatology

## 2021-01-11 DIAGNOSIS — L280 Lichen simplex chronicus: Secondary | ICD-10-CM | POA: Diagnosis not present

## 2021-01-11 DIAGNOSIS — B353 Tinea pedis: Secondary | ICD-10-CM

## 2021-01-11 DIAGNOSIS — L918 Other hypertrophic disorders of the skin: Secondary | ICD-10-CM

## 2021-01-11 DIAGNOSIS — L578 Other skin changes due to chronic exposure to nonionizing radiation: Secondary | ICD-10-CM

## 2021-01-11 DIAGNOSIS — D2239 Melanocytic nevi of other parts of face: Secondary | ICD-10-CM

## 2021-01-11 DIAGNOSIS — D2372 Other benign neoplasm of skin of left lower limb, including hip: Secondary | ICD-10-CM

## 2021-01-11 DIAGNOSIS — D229 Melanocytic nevi, unspecified: Secondary | ICD-10-CM | POA: Diagnosis not present

## 2021-01-11 DIAGNOSIS — Z1283 Encounter for screening for malignant neoplasm of skin: Secondary | ICD-10-CM

## 2021-01-11 DIAGNOSIS — I87303 Chronic venous hypertension (idiopathic) without complications of bilateral lower extremity: Secondary | ICD-10-CM

## 2021-01-11 DIAGNOSIS — L821 Other seborrheic keratosis: Secondary | ICD-10-CM

## 2021-01-11 DIAGNOSIS — L814 Other melanin hyperpigmentation: Secondary | ICD-10-CM

## 2021-01-11 MED ORDER — CLOBETASOL PROPIONATE 0.05 % EX CREA: TOPICAL_CREAM | CUTANEOUS | 0 refills | Status: DC

## 2021-01-11 MED ORDER — CICLOPIROX OLAMINE 0.77 % EX CREA: TOPICAL_CREAM | CUTANEOUS | 2 refills | Status: DC

## 2021-01-11 NOTE — Progress Notes (Signed)
Follow-Up Visit   Subjective  Emily Melton is a 60 y.o. female who presents for the following: Annual Exam (Patient presents for TBSE. Last appointment was around 2 years ago. No history of skin cancer. She has a rash on her left lower leg that started after wearing compression hose (8-52mHg). Area is itchy and she is using TMC 0.1% ointment. She uses DH[employer] Corporationand moisturizing cream after the shower.).    The following portions of the chart were reviewed this encounter and updated as appropriate:        Review of Systems:  No other skin or systemic complaints except as noted in HPI or Assessment and Plan.  Objective  Well appearing patient in no apparent distress; mood and affect are within normal limits.  A full examination was performed including scalp, head, eyes, ears, nose, lips, neck, chest, axillae, abdomen, back, buttocks, bilateral upper extremities, bilateral lower extremities, hands, feet, fingers, toes, fingernails, and toenails. All findings within normal limits unless otherwise noted below.  left pretibia, left calf Erythematous scaly plaque.  lower legs Edema and mild erythema of the lower legs.  Left Malar Cheek 6.023mflesh papule  bil feet Scaling and maceration web spaces and over distal and lateral soles.    Assessment & Plan  Skin cancer screening performed today. Actinic Damage - chronic, secondary to cumulative UV radiation exposure/sun exposure over time - diffuse scaly erythematous macules with underlying dyspigmentation - Recommend daily broad spectrum sunscreen SPF 30+ to sun-exposed areas, reapply every 2 hours as needed.  - Recommend staying in the shade or wearing long sleeves, sun glasses (UVA+UVB protection) and wide brim hats (4-inch brim around the entire circumference of the hat). - Call for new or changing lesions.  Lentigines - Scattered tan macules - Due to sun exposure - Benign-appering, observe -  Recommend daily broad spectrum sunscreen SPF 30+ to sun-exposed areas, reapply every 2 hours as needed. - Call for any changes  Seborrheic Keratoses - Stuck-on, waxy, tan-brown papules and/or plaques  - Benign-appearing - Discussed benign etiology and prognosis. - Observe - Call for any changes  Dermatofibroma - Firm pink/brown papulenodule with dimple sign of the left thigh - Benign appearing - Call for any changes  Acrochordons (Skin Tags) - Fleshy, skin-colored pedunculated papules - Benign appearing.  - Observe. - If desired, they can be removed with an in office procedure that is not covered by insurance. - Please call the clinic if you notice any new or changing lesions.  Melanocytic Nevi - Tan-brown and/or pink-flesh-colored symmetric macules and papules - Benign appearing on exam today - Observation - Call clinic for new or changing moles - Recommend daily use of broad spectrum spf 30+ sunscreen to sun-exposed areas.    Lichen simplex chronicus left pretibia, left calf  Chronic Dermatitis Start clobetasol cream Apply to AA leg BID until improved dsp   Avoid scratching/rubbing/digging.  Recommend OTC Gold Bond Rapid Relief Anti-Itch cream (pramoxine + menthol) or Sarna Original up to 3 times per day to areas that are itchy.  Topical steroids (such as triamcinolone, fluocinolone, fluocinonide, mometasone, clobetasol, halobetasol, betamethasone, hydrocortisone) can cause thinning and lightening of the skin if they are used for too long in the same area. Your physician has selected the right strength medicine for your problem and area affected on the body. Please use your medication only as directed by your physician to prevent side effects.    clobetasol cream (TEMOVATE) 0.05 % - left  pretibia, left calf Apply to affected area lower leg twice daily until itchy rash improved. Avoid face, groin, axilla.  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  Recommend daily compression hose/stockings- easiest to put on first thing in morning, remove at bedtime.  Elevate legs as much as possible. Avoid salt/sodium rich foods.  Get legs measured at [facility] to get right size compression hose.   Nevus Left Malar Cheek  Benign-appearing.  Observation.  Call clinic for new or changing moles.  Recommend daily use of broad spectrum spf 30+ sunscreen to sun-exposed areas.   Tinea pedis of right foot  Tinea pedis of both feet bil feet  Start Ciclopirox Cream Apply to feet and between toes BID x 4 weeks dsp 90g 2Rf.  Recommend AmLactin Cream or Gold Bond Rough & Bumpy after shower to feet  ciclopirox (LOPROX) 0.77 % cream - bil feet Apply to both feet and between toes twice a day x 4 weeks.  Return in about 1 month (around 02/10/2021) for [facility], tinea.  IJamesetta Orleans, CMA, am acting as scribe for Brendolyn Patty, MD .  Documentation: I have reviewed the above documentation for accuracy and completeness, and I agree with the above.  Brendolyn Patty MD

## 2021-01-11 NOTE — Patient Instructions (Addendum)
Recommend OTC Gold Bond Rapid Relief Anti-Itch cream (pramoxine + menthol) or Sarna Original up to 3 times per day to areas that are itchy.  Stasis in the legs causes chronic leg swelling, which may result in itchy or painful rashes, skin discoloration, skin texture changes, and sometimes ulceration.  Recommend daily compression hose/stockings- easiest to put on first thing in morning, remove at bedtime (Can be measured and purchased at [facility]).  Elevate legs as much as possible. Avoid salt/sodium rich foods.   If you have any questions or concerns for your doctor, please call our main line at [phone] and press option 4 to reach your doctor's medical assistant. If no one answers, please leave a voicemail as directed and we will return your call as soon as possible. Messages left after 4 pm will be answered the following business day.   You may also send [place] a message via [place]. We typically respond to MyChart messages within 1-2 business days.  For prescription refills, please ask your pharmacy to contact our office. Our fax number is [phone].  If you have an urgent issue when the clinic is closed that cannot wait until the next business day, you can page your doctor at the number below.    Please note that while we do our best to be available for urgent issues outside of office hours, we are not available 24/7.   If you have an urgent issue and are unable to reach [place], you may choose to seek medical care at your doctor's office, retail clinic, urgent care center, or emergency room.  If you have a medical emergency, please immediately call 911 or go to the emergency department.  Pager Numbers  - Dr. Nehemiah Massed: [phone]  - Dr. Laurence Ferrari: [phone]  - Dr. Nicole Kindred: [phone]  In the event of inclement weather, please call our main line at [phone] for an update on the status of any delays or closures.  Dermatology Medication Tips: Please keep the boxes that  topical medications come in in order to help keep track of the instructions about where and how to use these. Pharmacies typically print the medication instructions only on the boxes and not directly on the medication tubes.   If your medication is too expensive, please contact our office at [phone] option 4 or send [place] a message through [place].   We are unable to tell what your co-pay for medications will be in advance as this is different depending on your insurance coverage. However, we may be able to find a substitute medication at lower cost or fill out paperwork to get insurance to cover a needed medication.   If a prior authorization is required to get your medication covered by your insurance company, please allow [place] 1-2 business days to complete this process.  Drug prices often vary depending on where the prescription is filled and some pharmacies may offer cheaper prices.  The website www.goodrx.com contains coupons for medications through different pharmacies. The prices here do not account for what the cost may be with help from insurance (it may be cheaper with your insurance), but the website can give you the price if you did not use any insurance.  - You can print the associated coupon and take it with your prescription to the pharmacy.  - You may also stop by our office during regular business hours and pick up a GoodRx coupon card.  - If you need your prescription sent electronically to a different pharmacy, notify our  office through [facility] or by phone at [phone] option 4.

## 2021-01-12 ENCOUNTER — Other Ambulatory Visit: Payer: Self-pay

## 2021-01-12 DIAGNOSIS — L280 Lichen simplex chronicus: Secondary | ICD-10-CM

## 2021-01-12 MED ORDER — CLOBETASOL PROPIONATE 0.05 % EX CREA: TOPICAL_CREAM | CUTANEOUS | 0 refills | Status: DC

## 2021-01-12 NOTE — Progress Notes (Signed)
Patient called and [facility] did not receive the Clobetasol. RX re sent.

## 2021-01-20 ENCOUNTER — Ambulatory Visit
Admission: RE | Admit: 2021-01-20 | Discharge: 2021-01-20 | Disposition: A | Discharge status: Home / Self Care | Payer: Medicare Other | Source: Ambulatory Visit | Attending: Gastroenterology | Admitting: Gastroenterology

## 2021-01-20 ENCOUNTER — Other Ambulatory Visit: Payer: Self-pay

## 2021-01-20 DIAGNOSIS — K760 Fatty (change of) liver, not elsewhere classified: Secondary | ICD-10-CM | POA: Diagnosis present

## 2021-02-07 ENCOUNTER — Ambulatory Visit: Payer: Medicare Other | Admitting: Dermatology

## 2021-02-07 ENCOUNTER — Other Ambulatory Visit: Payer: Self-pay

## 2021-02-07 DIAGNOSIS — L280 Lichen simplex chronicus: Secondary | ICD-10-CM | POA: Diagnosis not present

## 2021-02-07 DIAGNOSIS — B353 Tinea pedis: Secondary | ICD-10-CM | POA: Diagnosis not present

## 2021-02-07 MED ORDER — TERBINAFINE HCL 250 MG PO TABS
250.0000 mg | ORAL_TABLET | Freq: Every day | ORAL | 0 refills | Status: AC
Start: 2021-02-07 — End: 2021-02-21

## 2021-02-07 NOTE — Patient Instructions (Addendum)
If you have any questions or concerns for your doctor, please call our main line at [phone] and press option 4 to reach your doctor's medical assistant. If no one answers, please leave a voicemail as directed and we will return your call as soon as possible. Messages left after 4 pm will be answered the following business day.   You may also send [place] a message via [employer]. We typically respond to MyChart messages within 1-2 business days.  For prescription refills, please ask your pharmacy to contact our office. Our fax number is [phone].  If you have an urgent issue when the clinic is closed that cannot wait until the next business day, you can page your doctor at the number below.    Please note that while we do our best to be available for urgent issues outside of office hours, we are not available 24/7.   If you have an urgent issue and are unable to reach [place], you may choose to seek medical care at your doctor's office, retail clinic, urgent care center, or emergency room.  If you have a medical emergency, please immediately call 911 or go to the emergency department.  Pager Numbers  - Dr. Nehemiah Massed: [phone]  - Dr. Laurence Ferrari: [phone]  - Dr. Nicole Kindred: [phone]  In the event of inclement weather, please call our main line at [phone] for an update on the status of any delays or closures.  Dermatology Medication Tips: Please keep the boxes that topical medications come in in order to help keep track of the instructions about where and how to use these. Pharmacies typically print the medication instructions only on the boxes and not directly on the medication tubes.   If your medication is too expensive, please contact our office at [phone] option 4 or send [place] a message through [place].   We are unable to tell what your co-pay for medications will be in advance as this is different depending on your insurance coverage. However, we may be able to find a substitute  medication at lower cost or fill out paperwork to get insurance to cover a needed medication.   If a prior authorization is required to get your medication covered by your insurance company, please allow [place] 1-2 business days to complete this process.  Drug prices often vary depending on where the prescription is filled and some pharmacies may offer cheaper prices.  The website www.goodrx.com contains coupons for medications through different pharmacies. The prices here do not account for what the cost may be with help from insurance (it may be cheaper with your insurance), but the website can give you the price if you did not use any insurance.  - You can print the associated coupon and take it with your prescription to the pharmacy.  - You may also stop by our office during regular business hours and pick up a GoodRx coupon card.  - If you need your prescription sent electronically to a different pharmacy, notify our office through [facility] or by phone at [phone] option 4.   Terbinafine Counseling  Terbinafine is an anti-fungal medicine that can be applied to the skin (over the counter) or taken by mouth (prescription) to treat fungal infections. The pill version is often used to treat fungal infections of the nails or scalp. While most people do not have any side effects from taking terbinafine pills, some possible side effects of the medicine can include taste changes, headache, loss of smell, vision changes, nausea, vomiting,  or diarrhea.   Rare side effects can include irritation of the liver, allergic reaction, or decrease in blood counts (which may show up as not feeling well or developing an infection). If you are concerned about any of these side effects, please stop the medicine and call your doctor, or in the case of an emergency such as feeling very unwell, seek immediate medical care.

## 2021-02-07 NOTE — Progress Notes (Signed)
   Follow-Up Visit   Subjective  Emily Melton is a 60 y.o. female who presents for the following: LSC (L pretibia, L calf, 79mf/u cleared with clobetasol cream) and Tinea Pedis (Bil feet, ciclopirox cream ~ qd, amlactin lotion). Not able to apply cream to feet since since can't reach them.   The following portions of the chart were reviewed this encounter and updated as appropriate:       Review of Systems:  No other skin or systemic complaints except as noted in HPI or Assessment and Plan.  Objective  Well appearing patient in no apparent distress; mood and affect are within normal limits.  A focused examination was performed including feet, L lower leg. Relevant physical exam findings are noted in the Assessment and Plan.  bil feet Diffuse scaling with hyperkeratosis plantar feet, heels, toes including webspaces  Left Lower Leg - Anterior Mild erythema with few small pink paps bil pretibial   Assessment & Plan  Tinea pedis of both feet bil feet  Not improved since not able to regularly apply topical antifungal cream Start Lamisil 2540m1 po qd with food x 2 wks, #14 0rf 12/02/20 LFTs are WNL Cont ciclopirox cream qd as able  Recommend cleaning/disinfecting crocs, wearing moisture wicking socks,  Can try Zeasorb powder qd inside socks if feet still sweaty  Terbinafine Counseling  Terbinafine is an anti-fungal medicine that can be applied to the skin (over the counter) or taken by mouth (prescription) to treat fungal infections. The pill version is often used to treat fungal infections of the nails or scalp. While most people do not have any side effects from taking terbinafine pills, some possible side effects of the medicine can include taste changes, headache, loss of smell, vision changes, nausea, vomiting, or diarrhea.   Rare side effects can include irritation of the liver, allergic reaction, or decrease in blood counts (which may show up as not feeling well or  developing an infection). If you are concerned about any of these side effects, please stop the medicine and call your doctor, or in the case of an emergency such as feeling very unwell, seek immediate medical care.    terbinafine (LAMISIL) 250 MG tablet - bil feet Take 1 tablet (250 mg total) by mouth daily for 14 days.  Related Medications ciclopirox (LOPROX) 0.77 % cream Apply to both feet and between toes twice a day x 4 weeks.  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   Cont Do[employer]ell Rubbermaidtart Amlactin cr qd to legs  Related Medications clobetasol cream (TEMOVATE) 0.05 % Apply to affected area lower leg twice daily until itchy rash improved. Avoid face, groin, axilla.  Return if symptoms worsen or fail to improve.  I, SoOthelia PullingRMA, am acting as scribe for TaBrendolyn PattyMD .  Documentation: I have reviewed the above documentation for accuracy and completeness, and I agree with the above.  TaBrendolyn PattyD

## 2021-03-11 ENCOUNTER — Other Ambulatory Visit: Payer: Self-pay | Admitting: Internal Medicine

## 2021-03-30 NOTE — Progress Notes (Signed)
HPI  female former smoker followed for nocturnal hypoxia/minimal OSA, history Narcolepsy Sleep Study 2016 - Dr Rexene Alberts. History of Narcolepsy - diagnosed 2001- tried and failed Ritalin and Provigil. NPSG  11/30/14- [facility] AHI 5.3/ hr, desat to 83%, weight 216 lbs Study in 2001 in [place], [place], with report no longer available, included MSLT and she says diagnosed narcolepsy without sleep apnea. The 2016 sleep study was supposed to have included an MST but that didn't get done and she didn't like her experience there. Takes Zanaflex for cervical radiculopathy pain and it helps her sleep .Previous trials of Provigil and Ritalin both said to cause HBP and "swelling". She ended up on Dexedrine for a while. Tried Mirapex for vaguely defined limb jerks. This caused a lot of malaise and nausea. NPSG 10/19/2015-AHI 2.4 per hour/WNL, desaturation to 81%, moderate snoring, severe PLMS with arousal 6.3/hour MSLT- 10/20/2015-mean latency 3 minutes 43 seconds, 0/5 SOREM    C/W idiopathic hypersomnia History of recurrent bronchitis and chronic cough until she quit smoking. Several previous pneumonias .PFT 12/08/2015-moderate obstructive airways disease, insignificant response to bronchodilator, normal total lung capacity. They could not get an accurate diffusion capacity ------------------------------------------------------------------------------------------------------   03/31/20- 60 year old female former smoker( 30 pk yr) followed for COPD, nocturnal hypoxia/ minimal OSA, history narcolepsy, insomnia, GERD/Barrett's, Hx parathyroidectomy/partial thyroidectomy/  (Remotely ritalin helped, but may have had allergic reaction.Didn't like Provigil. No cataplexy.) Now questions if adderall gives headache. Does not have CPAP or O2 Covid vax- 2 Phizer Tried Vyvanse 10 mg qAM-  Singulair. Ventolin hfa,  -----copd,sob only with exertion She reports doing well. Vyvanse is slow onset, up to 2 hours,  but works, w/o side effects of other meds tried. She says she did well in past with dexedrine, so we may try that next. Has only used a few Vyvanse since prescribed. Naps 2-3x/ week. Not much cough.since quit smoking.  CXR 04/23/2019-  IMPRESSION: Chronic bronchitic changes without evidence of acute cardiopulmonary process.  03/31/21- 60 year old female former smoker( 36 pk yr) followed for COPD, nocturnal hypoxia/ minimal OSA, history Narcolepsy, insomnia, complicated by GERD/Barrett's, Hx parathyroidectomy/partial thyroidectomy/  (Remotely ritalin helped, but may have had allergic reaction.Didn't like Provigil. No cataplexy.) Now questions if adderall gives headache.Tried Vyvanse 10 mg qAM-  -Ventolin hfa, Singulair,       note Inderal Does not have CPAP or O2 Covid vax- 3 Phizer Body weight today-240 lbs -----Started walking, sob-same, occass. Cough Now on B-blocker Inderal. Notes occ mild wheeze at night- albuterol sufficient  Discussed vaccinations. Discussed CT and Atherosclerosis. CT chest 11/25/20-  IMPRESSION: 1. Lung-RADS 2, benign appearance or behavior. Continue annual screening with low-dose chest CT without contrast in 12 months. 2. Hepatic steatosis. 3.  Emphysema (ICD10-J43.9) and Aortic Atherosclerosis (ICD10-170.0)  Singulair. Ventolin hfa,  ROS-see HPI  + = positive Constitutional:    weight loss, night sweats, fevers, chills, + fatigue, lassitude. HEENT:    headaches, difficulty swallowing, tooth/dental problems, sore throat,       sneezing, itching, ear ache, nasal congestion, post nasal drip, snoring CV:    chest pain, orthopnea, PND, swelling in lower extremities, anasarca,                                                     dizziness, palpitations Resp:   shortness of breath with exertion or at rest.                +  roductive cough,   non-productive cough, coughing up of blood.              change in color of mucus.  wheezing.   Skin:    rash or lesions. GI:   No-   heartburn, indigestion, abdominal pain, nausea, vomiting, diarrhea,                 change in bowel habits, loss of appetite GU: dysuria, change in color of urine, no urgency or frequency.   flank pain. MS:   joint pain, stiffness, decreased range of motion, + back pain. Neuro-     nothing unusual Psych:  change in mood or affect.  depression or anxiety.   memory loss.  OBJ- Physical Exam General- Alert, Oriented, Affect-appropriate, Distress- none acute, + obese Skin- rash-none, lesions- none, excoriation- none Lymphadenopathy- none Head- atraumatic            Eyes- Gross vision intact, PERRLA, conjunctivae and secretions clear            Ears- Hearing, canals-normal            Nose- Clear, no-Septal dev, mucus, polyps, erosion, perforation             Throat- Mallampati III , mucosa clear , drainage- none, tonsils- atrophic Neck- flexible , trachea midline, no stridor , thyroid nl, carotid no bruit Chest - symmetrical excursion , unlabored           Heart/CV- RRR , no murmur , no gallop  , no rub, nl s1 s2                           - JVD- none , edema- none, stasis changes- none, varices- none           Lung- clear to P&A, wheeze- none, cough- none , dullness-none, rub- none           Chest wall-  Abd-  Br/ Gen/ Rectal- Not done, not indicated Extrem- cyanosis- none, clubbing, none, atrophy- none, strength- nl Neuro- grossly intact to observation

## 2021-03-31 ENCOUNTER — Encounter: Payer: Self-pay | Admitting: Internal Medicine

## 2021-03-31 ENCOUNTER — Ambulatory Visit: Payer: Medicare Other | Admitting: Internal Medicine

## 2021-03-31 ENCOUNTER — Other Ambulatory Visit: Payer: Self-pay

## 2021-03-31 VITALS — BP 122/64 | HR 50 | Temp 97.9°F | Ht 62.0 in | Wt 240.0 lb

## 2021-03-31 DIAGNOSIS — J449 Chronic obstructive pulmonary disease, unspecified: Secondary | ICD-10-CM | POA: Diagnosis not present

## 2021-03-31 DIAGNOSIS — Z23 Encounter for immunization: Secondary | ICD-10-CM

## 2021-03-31 DIAGNOSIS — G47419 Narcolepsy without cataplexy: Secondary | ICD-10-CM | POA: Diagnosis not present

## 2021-03-31 NOTE — Assessment & Plan Note (Signed)
Uncomplicated. Occasional rescue inhaler seems sufficient for now, unless she has flare of bronchitis. Plan- med talk, Flu vax, Prevnar 13

## 2021-03-31 NOTE — Assessment & Plan Note (Signed)
Trying Vyvanse

## 2021-03-31 NOTE — Patient Instructions (Signed)
Order- Flu vax standard  Order- Prevnar 13 pneumonia vaccine  Keep up the walking- good for you !

## 2021-04-20 ENCOUNTER — Other Ambulatory Visit: Payer: Self-pay

## 2021-04-20 ENCOUNTER — Ambulatory Visit
Admission: RE | Admit: 2021-04-20 | Discharge: 2021-04-20 | Disposition: A | Discharge status: Home / Self Care | Payer: Medicare Other | Source: Ambulatory Visit | Attending: Family Medicine | Admitting: Family Medicine

## 2021-04-20 DIAGNOSIS — Z1231 Encounter for screening mammogram for malignant neoplasm of breast: Secondary | ICD-10-CM | POA: Diagnosis present

## 2021-08-09 ENCOUNTER — Other Ambulatory Visit: Payer: Self-pay | Admitting: Family Medicine

## 2021-08-09 DIAGNOSIS — R221 Localized swelling, mass and lump, neck: Secondary | ICD-10-CM

## 2021-08-10 ENCOUNTER — Other Ambulatory Visit: Payer: Self-pay | Admitting: Family Medicine

## 2021-08-10 DIAGNOSIS — R221 Localized swelling, mass and lump, neck: Secondary | ICD-10-CM

## 2021-08-10 DIAGNOSIS — R051 Acute cough: Secondary | ICD-10-CM

## 2021-08-10 DIAGNOSIS — L049 Acute lymphadenitis, unspecified: Secondary | ICD-10-CM

## 2021-08-12 ENCOUNTER — Other Ambulatory Visit: Payer: Self-pay | Admitting: Family Medicine

## 2021-08-12 ENCOUNTER — Other Ambulatory Visit: Payer: Self-pay

## 2021-08-12 ENCOUNTER — Ambulatory Visit
Admission: RE | Admit: 2021-08-12 | Discharge: 2021-08-12 | Disposition: A | Discharge status: Home / Self Care | Payer: Medicare Other | Source: Ambulatory Visit | Attending: Unknown Physician Specialty | Admitting: Unknown Physician Specialty

## 2021-08-12 DIAGNOSIS — R221 Localized swelling, mass and lump, neck: Secondary | ICD-10-CM

## 2021-08-12 DIAGNOSIS — L049 Acute lymphadenitis, unspecified: Secondary | ICD-10-CM | POA: Insufficient documentation

## 2021-08-12 DIAGNOSIS — R051 Acute cough: Secondary | ICD-10-CM | POA: Insufficient documentation

## 2021-08-18 ENCOUNTER — Other Ambulatory Visit: Payer: Self-pay | Admitting: Internal Medicine

## 2021-12-22 ENCOUNTER — Other Ambulatory Visit: Payer: Self-pay | Admitting: Internal Medicine

## 2022-01-02 ENCOUNTER — Other Ambulatory Visit: Payer: Self-pay | Admitting: *Deleted

## 2022-01-02 DIAGNOSIS — Z87891 Personal history of nicotine dependence: Secondary | ICD-10-CM

## 2022-01-02 DIAGNOSIS — Z122 Encounter for screening for malignant neoplasm of respiratory organs: Secondary | ICD-10-CM

## 2022-01-02 NOTE — Progress Notes (Signed)
Ct chest

## 2022-01-18 ENCOUNTER — Ambulatory Visit
Admission: RE | Admit: 2022-01-18 | Discharge: 2022-01-18 | Disposition: A | Discharge status: Home / Self Care | Payer: Medicare Other | Source: Ambulatory Visit | Attending: Family Medicine | Admitting: Family Medicine

## 2022-01-18 ENCOUNTER — Other Ambulatory Visit: Payer: Self-pay | Admitting: Acute Care

## 2022-01-18 DIAGNOSIS — Z122 Encounter for screening for malignant neoplasm of respiratory organs: Secondary | ICD-10-CM | POA: Diagnosis present

## 2022-01-18 DIAGNOSIS — I700 Atherosclerosis of aorta: Secondary | ICD-10-CM | POA: Insufficient documentation

## 2022-01-18 DIAGNOSIS — Z87891 Personal history of nicotine dependence: Secondary | ICD-10-CM | POA: Insufficient documentation

## 2022-01-18 DIAGNOSIS — J439 Emphysema, unspecified: Secondary | ICD-10-CM | POA: Insufficient documentation

## 2022-01-18 DIAGNOSIS — I10 Essential (primary) hypertension: Secondary | ICD-10-CM | POA: Diagnosis not present

## 2022-01-18 DIAGNOSIS — K760 Fatty (change of) liver, not elsewhere classified: Secondary | ICD-10-CM | POA: Insufficient documentation

## 2022-02-13 ENCOUNTER — Ambulatory Visit: Payer: Medicare Other | Admitting: Dermatology

## 2022-02-13 DIAGNOSIS — Z1283 Encounter for screening for malignant neoplasm of skin: Secondary | ICD-10-CM | POA: Diagnosis not present

## 2022-02-13 DIAGNOSIS — L821 Other seborrheic keratosis: Secondary | ICD-10-CM

## 2022-02-13 DIAGNOSIS — D229 Melanocytic nevi, unspecified: Secondary | ICD-10-CM

## 2022-02-13 DIAGNOSIS — L814 Other melanin hyperpigmentation: Secondary | ICD-10-CM

## 2022-02-13 DIAGNOSIS — L918 Other hypertrophic disorders of the skin: Secondary | ICD-10-CM

## 2022-02-13 DIAGNOSIS — Z7189 Other specified counseling: Secondary | ICD-10-CM

## 2022-02-13 DIAGNOSIS — B351 Tinea unguium: Secondary | ICD-10-CM | POA: Diagnosis not present

## 2022-02-13 DIAGNOSIS — L820 Inflamed seborrheic keratosis: Secondary | ICD-10-CM

## 2022-02-13 DIAGNOSIS — D1800 Hemangioma unspecified site: Secondary | ICD-10-CM

## 2022-02-13 DIAGNOSIS — L578 Other skin changes due to chronic exposure to nonionizing radiation: Secondary | ICD-10-CM

## 2022-02-13 MED ORDER — CICLOPIROX OLAMINE 0.77 % EX CREA: TOPICAL_CREAM | Freq: Two times a day (BID) | CUTANEOUS | 3 refills | Status: DC

## 2022-02-13 MED ORDER — JUBLIA 10 % EX SOLN: 1.0000 | Freq: Every evening | CUTANEOUS | 11 refills | Status: DC

## 2022-02-13 NOTE — Patient Instructions (Addendum)
Start Jublia solution nightly to affected toenail Start Eucerin roughness relief spot treatment cream (over the counter) nightly to affected toenail  Cryotherapy Aftercare  Wash gently with soap and water everyday.   Apply Vaseline and Band-Aid daily until healed.    Due to recent changes in healthcare laws, you may see results of your pathology and/or laboratory studies on MyChart before the doctors have had a chance to review them. We understand that in some cases there may be results that are confusing or concerning to you. Please understand that not all results are received at the same time and often the doctors may need to interpret multiple results in order to provide you with the best plan of care or course of treatment. Therefore, we ask that you please give [place] 2 business days to thoroughly review all your results before contacting the office for clarification. Should we see a critical lab result, you will be contacted sooner.   If You Need Anything After Your Visit  If you have any questions or concerns for your doctor, please call our main line at [phone] and press option 4 to reach your doctor's medical assistant. If no one answers, please leave a voicemail as directed and we will return your call as soon as possible. Messages left after 4 pm will be answered the following business day.   You may also send [place] a message via [place]. We typically respond to MyChart messages within 1-2 business days.  For prescription refills, please ask your pharmacy to contact our office. Our fax number is [phone].  If you have an urgent issue when the clinic is closed that cannot wait until the next business day, you can page your doctor at the number below.    Please note that while we do our best to be available for urgent issues outside of office hours, we are not available 24/7.   If you have an urgent issue and are unable to reach [place], you may choose to seek medical care at your  doctor's office, retail clinic, urgent care center, or emergency room.  If you have a medical emergency, please immediately call 911 or go to the emergency department.  Pager Numbers  - Dr. Nehemiah Massed: [phone]  - Dr. Laurence Ferrari: [phone]  - Dr. Nicole Kindred: [phone]  In the event of inclement weather, please call our main line at [phone] for an update on the status of any delays or closures.  Dermatology Medication Tips: Please keep the boxes that topical medications come in in order to help keep track of the instructions about where and how to use these. Pharmacies typically print the medication instructions only on the boxes and not directly on the medication tubes.   If your medication is too expensive, please contact our office at [phone] option 4 or send [place] a message through [place].   We are unable to tell what your co-pay for medications will be in advance as this is different depending on your insurance coverage. However, we may be able to find a substitute medication at lower cost or fill out paperwork to get insurance to cover a needed medication.   If a prior authorization is required to get your medication covered by your insurance company, please allow [place] 1-2 business days to complete this process.  Drug prices often vary depending on where the prescription is filled and some pharmacies may offer cheaper prices.  The website www.goodrx.com contains coupons for medications through different pharmacies. The prices here do not account  for what the cost may be with help from insurance (it may be cheaper with your insurance), but the website can give you the price if you did not use any insurance.  - You can print the associated coupon and take it with your prescription to the pharmacy.  - You may also stop by our office during regular business hours and pick up a GoodRx coupon card.  - If you need your prescription sent electronically to a different pharmacy, notify  our office through [facility] or by phone at [phone] option 4.     Si Usted Necesita Algo Despus de Su Visita  Tambin puede enviarnos un mensaje a travs de Pharmacist, community. Por lo general respondemos a los mensajes de MyChart en el transcurso de 1 a 2 das hbiles.  Para renovar recetas, por favor pida a su farmacia que se ponga en contacto con nuestra oficina. Harland Dingwall de fax es [place] [phone].  Si tiene un asunto urgente cuando la clnica est cerrada y que no puede esperar hasta el siguiente da hbil, puede llamar/localizar a su doctor(a) al nmero que aparece a continuacin.   Por favor, tenga en cuenta que aunque hacemos todo lo posible para estar disponibles para asuntos urgentes fuera del horario de [place], no estamos disponibles las 24 horas del da, los 7 das de la [place].   Si tiene un problema urgente y no puede comunicarse con nosotros, puede optar por buscar atencin mdica  en el consultorio de su doctor(a), en una clnica privada, en un centro de atencin urgente o en una sala de emergencias.  Si tiene Engineering geologist, por favor llame inmediatamente al 911 o vaya a la sala de emergencias.  Nmeros de bper  - Dr. Nehemiah Massed: [phone]  - Dra. Moye: [phone]  - Dra. Nicole Kindred: [phone]  En caso de inclemencias del [place], por favor llame a Johnsie Kindred principal al [phone] para una actualizacin sobre el [place] de cualquier retraso o cierre.  Consejos para la medicacin en dermatologa: Por favor, guarde las cajas en las que vienen los medicamentos de uso tpico para ayudarle a seguir las instrucciones sobre dnde y cmo usarlos. Las farmacias generalmente imprimen las instrucciones del medicamento slo en las cajas y no directamente en los tubos del [place].   Si su medicamento es muy caro, por favor, pngase en contacto con Zigmund Daniel llamando al [phone] y presione la opcin 4 o envenos un mensaje a travs de  Pharmacist, community.   No podemos decirle cul ser su copago por los medicamentos por adelantado ya que esto es diferente dependiendo de la cobertura de su seguro. Sin embargo, es posible que podamos encontrar un medicamento sustituto a Electrical engineer un formulario para que el seguro cubra el medicamento que se considera necesario.   Si se requiere una autorizacin previa para que su compaa de seguros [place] su medicamento, por favor permtanos de 1 a 2 das hbiles para completar este proceso.  Los precios de los medicamentos varan con frecuencia dependiendo del Environmental consultant de dnde se surte la receta y alguna farmacias pueden ofrecer precios ms baratos.  El sitio web www.goodrx.com tiene cupones para medicamentos de Airline pilot. Los precios aqu no tienen en cuenta lo que podra costar con la ayuda del seguro (puede ser ms barato con su seguro), pero el sitio web puede darle el precio si no utiliz Research scientist (physical sciences).  - Puede imprimir el cupn correspondiente y llevarlo con su receta a la farmacia.  Lavera Guise  puede pasar por nuestra oficina durante el horario de atencin regular y Charity fundraiser una tarjeta de cupones de GoodRx.  - Si necesita que su receta se enve electrnicamente a una farmacia diferente, informe a nuestra oficina a travs de MyChart de [place] o por telfono llamando al [phone] y presione la opcin 4.

## 2022-02-13 NOTE — Progress Notes (Signed)
Follow-Up Visit   Subjective  Emily Melton is a 61 y.o. female who presents for the following: Total body skin exam and check spots (L  ant thigh, while, itchy and crusty/R calf, ~1 yr, no symptoms).  The patient presents for Total-Body Skin Exam (TBSE) for skin cancer screening and mole check.  The patient has spots, moles and lesions to be evaluated, some may be new or changing and the patient has concerns that these could be cancer.   The following portions of the chart were reviewed this encounter and updated as appropriate:       Review of Systems:  No other skin or systemic complaints except as noted in HPI or Assessment and Plan.  Objective  Well appearing patient in no apparent distress; mood and affect are within normal limits.  A full examination was performed including scalp, head, eyes, ears, nose, lips, neck, chest, axillae, abdomen, back, buttocks, bilateral upper extremities, bilateral lower extremities, hands, feet, fingers, toes, fingernails, and toenails. All findings within normal limits unless otherwise noted below.  L great toenail Toenail dystrophy with thickening and yellow/white discoloration  L ant thigh x 1 Stuck on waxy paps with erythema    Assessment & Plan   Lentigines - Scattered tan macules - Due to sun exposure - Benign-appearing, observe - Recommend daily broad spectrum sunscreen SPF 30+ to sun-exposed areas, reapply every 2 hours as needed. - Call for any changes - back, chest, arms  Seborrheic Keratoses - Stuck-on, waxy, tan-brown papules and/or plaques  - Benign-appearing - Discussed benign etiology and prognosis. - Observe - Call for any changes - arms, legs  Melanocytic Nevi - Tan-brown and/or pink-flesh-colored symmetric macules and papules - Benign appearing on exam today - Observation - Call clinic for new or changing moles - Recommend daily use of broad spectrum spf 30+ sunscreen to sun-exposed areas.  - back,  chest  Hemangiomas - Red papules - Discussed benign nature - Observe - Call for any changes - abdomen  Actinic Damage - Chronic condition, secondary to cumulative UV/sun exposure - diffuse scaly erythematous macules with underlying dyspigmentation - Recommend daily broad spectrum sunscreen SPF 30+ to sun-exposed areas, reapply every 2 hours as needed.  - Staying in the shade or wearing long sleeves, sun glasses (UVA+UVB protection) and wide brim hats (4-inch brim around the entire circumference of the hat) are also recommended for sun protection.  - Call for new or changing lesions.  Skin cancer screening performed today.  Acrochordons (Skin Tags) - Fleshy, skin-colored pedunculated papules - Benign appearing.  - Observe. - If desired, they can be removed with an in office procedure that is not covered by insurance. - Please call the clinic if you notice any new or changing lesions.  - neck, axilla  Tinea unguium L great toenail  Chronic and persistent condition with duration or expected duration over one year. Condition is symptomatic/ bothersome to patient. Not currently at goal.  With hx of Tinea pedis, clear today  Discussed oral terbinafine Pt declines Terbinafine due to be in a statin and hx of fatty liver and risk of liver inflammation  Terbinafine Counseling  Terbinafine is an anti-fungal medicine that can be applied to the skin (over the counter) or taken by mouth (prescription) to treat fungal infections. The pill version is often used to treat fungal infections of the nails or scalp. While most people do not have any side effects from taking terbinafine pills, some possible side effects of the medicine  can include taste changes, headache, loss of smell, vision changes, nausea, vomiting, or diarrhea.   Rare side effects can include irritation of the liver, allergic reaction, or decrease in blood counts (which may show up as not feeling well or developing an infection).  If you are concerned about any of these side effects, please stop the medicine and call your doctor, or in the case of an emergency such as feeling very unwell, seek immediate medical care.     Start Jublia sol qhs to affected toenail Start Eucerin roughness relief spot treatment cr qhs under occlusion Cont Ciclopirox cr bid to feet prn flares  Efinaconazole (JUBLIA) 10 % SOLN - L great toenail Apply 1 Application topically at bedtime.  ciclopirox (LOPROX) 0.77 % cream - L great toenail Apply topically 2 (two) times daily. Bid to feet prn flares  Inflamed seborrheic keratosis L ant thigh x 1  Symptomatic, irritating, patient would like treated.   Destruction of lesion - L ant thigh x 1  Destruction method: cryotherapy   Informed consent: discussed and consent obtained   Lesion destroyed using liquid nitrogen: Yes   Region frozen until ice ball extended beyond lesion: Yes   Outcome: patient tolerated procedure well with no complications   Post-procedure details: wound care instructions given   Additional details:  Prior to procedure, discussed risks of blister formation, small wound, skin dyspigmentation, or rare scar following cryotherapy. Recommend Vaseline ointment to treated areas while healing.    Return in about 1 year (around 02/14/2023) for TBSE.  I, Othelia Pulling, RMA, am acting as scribe for Brendolyn Patty, MD .  Documentation: I have reviewed the above documentation for accuracy and completeness, and I agree with the above.  Brendolyn Patty MD

## 2022-02-14 ENCOUNTER — Other Ambulatory Visit: Payer: Self-pay

## 2022-02-14 ENCOUNTER — Encounter: Payer: Self-pay | Admitting: Dermatology

## 2022-02-14 MED ORDER — CICLOPIROX 8 % EX SOLN: Freq: Every day | CUTANEOUS | 11 refills | Status: DC

## 2022-02-14 NOTE — Progress Notes (Signed)
It is ciclopirox nail lacquer, apply qhs to affected toenail, remove weekly with nail polish remover.  1 yr rf

## 2022-03-07 ENCOUNTER — Other Ambulatory Visit: Payer: Self-pay | Admitting: Family Medicine

## 2022-03-07 DIAGNOSIS — Z1231 Encounter for screening mammogram for malignant neoplasm of breast: Secondary | ICD-10-CM

## 2022-03-29 NOTE — Progress Notes (Signed)
HPI  female former smoker followed for nocturnal hypoxia/minimal OSA, history Narcolepsy Sleep Study 2016 - Dr Rexene Alberts. History of Narcolepsy - diagnosed 2001- tried and failed Ritalin and Provigil. NPSG  11/30/14- [facility] AHI 5.3/ hr, desat to 83%, weight 216 lbs Study in 2001 in [place], [place], with report no longer available, included MSLT and she says diagnosed narcolepsy without sleep apnea. The 2016 sleep study was supposed to have included an MSLT but that didn't get done and she didn't like her experience there. Takes Zanaflex for cervical radiculopathy pain and it helps her sleep .Previous trials of Provigil and Ritalin both said to cause HBP and "swelling". She ended up on Dexedrine for a while. Tried Mirapex for vaguely defined limb jerks. This caused a lot of malaise and nausea. NPSG 10/19/2015-AHI 2.4 per hour/WNL, desaturation to 81%, moderate snoring, severe PLMS with arousal 6.3/hour MSLT- 10/20/2015-mean latency 3 minutes 43 seconds, 0/5 SOREM    C/W idiopathic hypersomnia History of recurrent bronchitis and chronic cough until she quit smoking. Several previous pneumonias .PFT 12/08/2015-moderate obstructive airways disease, insignificant response to bronchodilator, normal total lung capacity. They could not get an accurate diffusion capacity ------------------------------------------------------------------------------------------------------  03/31/21- 61 year old female former smoker( 36 pk yr) followed for COPD, nocturnal hypoxia/ minimal OSA, history Narcolepsy, insomnia, complicated by GERD/Barrett's, Hx parathyroidectomy/partial thyroidectomy/  (Remotely ritalin helped, but may have had allergic reaction.Didn't like Provigil. No cataplexy.) Now questions if adderall gives headache.Tried Vyvanse 10 mg qAM-  -Ventolin hfa, Singulair,       note Inderal Does not have CPAP or O2 Covid vax- 3 Phizer Body weight today-240 lbs -----Started walking, sob-same,  occass. Cough Now on B-blocker Inderal. Notes occ mild wheeze at night- albuterol sufficient  Discussed vaccinations. Discussed CT and Atherosclerosis. CT chest 11/25/20-  IMPRESSION: 1. Lung-RADS 2, benign appearance or behavior. Continue annual screening with low-dose chest CT without contrast in 12 months. 2. Hepatic steatosis. 3.  Emphysema (ICD10-J43.9) and Aortic Atherosclerosis (ICD10-170.0)  03/31/22- 61 year old female former smoker( 36 pk yr) followed for COPD, nocturnal hypoxia/ minimal OSA,  Narcolepsy, insomnia, complicated by GERD/Barrett's, Hx parathyroidectomy/partial thyroidectomy/ Obesity, HTN,  (Remotely ritalin helped, but may have had allergic reaction.Didn't like Provigil. No cataplexy.) Now questions if adderall gives headache.Tried Vyvanse 10 mg qAM-  -Ventolin hfa, Singulair, Dexedrine 10 mg      note Inderal Does not have CPAP or O2 Covid vax- 3 Phizer Flu vax-today standard Body weight today-241 lbs Seen at [facility] January for COPD exacerbation, which she says was just a cold. Dexedrine works well for her in the morning.  Sometimes at [facility] time and early afternoon she feels very tired and asks about ways to manage that.  We discussed short naps, not longer than 30 minutes, and a trial of OTC caffeine tablet. We want to clarify her nocturnal oxygen and general pulmonary status.  Eventually we may need to update a sleep study. Standard flu vaccine today. CT Chest Lung Cancer Screen 01/18/22- IMPRESSION: 1. Lung-RADS 2, benign appearance or behavior. Continue annual screening with low-dose chest CT without contrast in 12 months. 2. Hepatic steatosis. 3.  Aortic atherosclerosis (ICD10-I70.0). 4. Enlarged pulmonic trunk, indicative of pulmonary arterial hypertension. 5.  Emphysema (ICD10-J43.9).   ROS-see HPI  + = positive Constitutional:    weight loss, night sweats, fevers, chills, + fatigue, lassitude. HEENT:    headaches, difficulty swallowing, tooth/dental  problems, sore throat,       sneezing, itching, ear ache, nasal congestion, post nasal drip, snoring CV:  chest pain, orthopnea, PND, swelling in lower extremities, anasarca,                                                     dizziness, palpitations Resp:   shortness of breath with exertion or at rest.                +roductive cough,   non-productive cough, coughing up of blood.              change in color of mucus.  wheezing.   Skin:    rash or lesions. GI:  No-   heartburn, indigestion, abdominal pain, nausea, vomiting, diarrhea,                 change in bowel habits, loss of appetite GU: dysuria, change in color of urine, no urgency or frequency.   flank pain. MS:   joint pain, stiffness, decreased range of motion, + back pain. Neuro-     nothing unusual Psych:  change in mood or affect.  depression or anxiety.   memory loss.  OBJ- Physical Exam General- Alert, Oriented, Affect-appropriate, Distress- none acute, + obese Skin- rash-none, lesions- none, excoriation- none Lymphadenopathy- none Head- atraumatic            Eyes- Gross vision intact, PERRLA, conjunctivae and secretions clear            Ears- Hearing, canals-normal            Nose- Clear, no-Septal dev, mucus, polyps, erosion, perforation             Throat- Mallampati III , mucosa clear , drainage- none, tonsils- atrophic Neck- flexible , trachea midline, no stridor , thyroid nl, carotid no bruit Chest - symmetrical excursion , unlabored           Heart/CV- RRR , no murmur , no gallop  , no rub, nl s1 s2                           - JVD- none , edema- none, stasis changes- none, varices- none           Lung- clear to P&A, wheeze- none, cough- none , dullness-none, rub- none           Chest wall-  Abd-  Br/ Gen/ Rectal- Not done, not indicated Extrem- cyanosis- none, clubbing, none, atrophy- none, strength- nl Neuro- grossly intact to observation

## 2022-03-31 ENCOUNTER — Ambulatory Visit: Payer: Medicare Other | Admitting: Internal Medicine

## 2022-03-31 ENCOUNTER — Encounter: Payer: Self-pay | Admitting: Internal Medicine

## 2022-03-31 VITALS — BP 120/60 | HR 50 | Temp 97.6°F | Ht 62.0 in | Wt 241.6 lb

## 2022-03-31 DIAGNOSIS — Z23 Encounter for immunization: Secondary | ICD-10-CM | POA: Diagnosis not present

## 2022-03-31 DIAGNOSIS — J449 Chronic obstructive pulmonary disease, unspecified: Secondary | ICD-10-CM | POA: Diagnosis not present

## 2022-03-31 DIAGNOSIS — G4734 Idiopathic sleep related nonobstructive alveolar hypoventilation: Secondary | ICD-10-CM | POA: Diagnosis not present

## 2022-03-31 DIAGNOSIS — G47419 Narcolepsy without cataplexy: Secondary | ICD-10-CM

## 2022-03-31 MED ORDER — DEXTROAMPHETAMINE SULFATE ER 10 MG PO CP24: ORAL_CAPSULE | ORAL | 0 refills | Status: DC

## 2022-03-31 NOTE — Patient Instructions (Signed)
Order- schedule overnight oximetry on room air     dx nocturnal hypoxemia  Script sent refilling Dexedrine  For the afternoon fatigue, try a short nap ( 30 minutes) or maybe an otc caffeine tablet like NoDoz  Flu shot- done  Please call if we can help

## 2022-03-31 NOTE — Assessment & Plan Note (Signed)
Oxygen status with sleep needs clarification. Plan-overnight oximetry on room air.

## 2022-03-31 NOTE — Assessment & Plan Note (Signed)
Benefits from Dexedrine used as directed.  Had not tolerated some alternatives.  Does sometimes need help mid afternoon. Plan-short naps.  Trial of caffeine tablet occasionally.

## 2022-04-21 ENCOUNTER — Ambulatory Visit
Admission: RE | Admit: 2022-04-21 | Discharge: 2022-04-21 | Disposition: A | Discharge status: Home / Self Care | Payer: Medicare Other | Source: Ambulatory Visit | Attending: Family Medicine | Admitting: Family Medicine

## 2022-04-21 DIAGNOSIS — Z1231 Encounter for screening mammogram for malignant neoplasm of breast: Secondary | ICD-10-CM | POA: Diagnosis present

## 2022-05-16 ENCOUNTER — Other Ambulatory Visit: Payer: Self-pay | Admitting: Internal Medicine

## 2022-05-17 ENCOUNTER — Telehealth: Payer: Self-pay | Admitting: Internal Medicine

## 2022-05-17 MED ORDER — DEXTROAMPHETAMINE SULFATE ER 10 MG PO CP24: ORAL_CAPSULE | ORAL | 0 refills | Status: DC

## 2022-05-17 NOTE — Telephone Encounter (Signed)
Albuterol hfa refilled

## 2022-05-17 NOTE — Telephone Encounter (Signed)
Pt called stating she was needing a new Rx of her dexedrine 21m capsule. Pt said after [facility] sent the prior Rx for 60 capsules, the pharmacy only had 30 in stock. Pt was told that they do have the Rx in stock again but in order for her to get more of the med, a new Rx needed to be sent to the pharmacy.  Pt's preferred pharmacy is [facility] in [place] [address]. YAnnamaria Boots please advise on this.   Allergies  Allergen Reactions   Duloxetine Hcl Other (See Comments)    Hypertension, mood changes   Mirtazapine Swelling   Serotonin Reuptake Inhibitors (Ssris) Other (See Comments)    Hypertension, mood changes   St Johns Wort Anaphylaxis   Bupropion Hcl Other (See Comments)    Mental changes and high blood pressure    Celebrex [Celecoxib] Hypertension   Effexor [Venlafaxine] Other (See Comments)    Moodiness, vivid dreams, poor sleep cycle.   Gralise [Gabapentin (Once-Daily)] Cough   Levothyroxine Sodium Other (See Comments)    Pt reports unable to take 75 mg - heartburn   Lyrica [Pregabalin] Other (See Comments) and Hypertension    Mood changes, eye twitching and UTI   Methylphenidate Hcl Other (See Comments)    unknown   Mirapex [Pramipexole]     HTN, hallucinations and nausea   Modafinil Swelling and Hypertension   Nexium [Esomeprazole]     GI side effects   Rabeprazole Sodium Swelling and Other (See Comments)    Headaches    Savella [Milnacipran Hcl] Other (See Comments)   Zegerid [Omeprazole] Other (See Comments)   Lisinopril Rash    Hypertension     Current Outpatient Medications:    albuterol (VENTOLIN HFA) 108 (90 Base) MCG/ACT inhaler, INHALE 2 PUFFS INTO THE LUNGS EVERY 4 HOURS AS NEEDED, Disp: 8.5 g, Rfl: 12   amLODipine (NORVASC) 5 MG tablet, Take 5 mg by mouth daily., Disp: , Rfl:    cetirizine (ZYRTEC) 10 MG tablet, Take 10 mg by mouth at bedtime. , Disp: , Rfl:    Cholecalciferol (D3-1000 PO), , Disp: , Rfl:    ciclopirox (LOPROX) 0.77 % cream, Apply topically 2 (two) times  daily. Bid to feet prn flares, Disp: 90 g, Rfl: 3   ciclopirox (PENLAC) 8 % solution, Apply topically at bedtime. Apply over nail. Apply daily over previous coat. After seven (7) days, may remove with nail polish remover and continue cycle., Disp: 6 mL, Rfl: 11   dextroamphetamine (DEXEDRINE) 10 MG 24 hr capsule, 1 or 2 by mouth daily as needed, Disp: 60 capsule, Rfl: 0   dicyclomine (BENTYL) 10 MG capsule, Take 1 capsule (10 mg total) by mouth 3 (three) times daily as needed for up to 14 days for spasms., Disp: 42 capsule, Rfl: 0   levothyroxine (SYNTHROID, LEVOTHROID) 50 MCG tablet, Take 75 mcg by mouth daily before breakfast. , Disp: , Rfl:    lovastatin (MEVACOR) 20 MG tablet, Take 20 mg by mouth daily., Disp: , Rfl:    montelukast (SINGULAIR) 10 MG tablet, Take 1 tablet (10 mg total) by mouth at bedtime., Disp: 90 tablet, Rfl: 3   olmesartan-hydrochlorothiazide (BENICAR HCT) 40-12.5 MG tablet, Take 1 tablet by mouth daily., Disp: , Rfl:    propranolol (INDERAL) 20 MG tablet, Take 3 tablets (60 mg total) by mouth 2 (two) times daily., Disp: 540 tablet, Rfl: 3   tiZANidine (ZANAFLEX) 4 MG tablet, Take 1 tablet (4 mg total) by mouth every 6 (six) hours as needed  for muscle spasms., Disp: 60 tablet, Rfl: 2

## 2022-05-17 NOTE — Telephone Encounter (Signed)
Dexedrine refilled

## 2022-05-17 NOTE — Telephone Encounter (Signed)
Called and spoke with pt letting her know that [facility] refilled her medication for her and she verbalized understanding. Nothing further needed.

## 2022-06-06 ENCOUNTER — Other Ambulatory Visit: Payer: Self-pay | Admitting: Gastroenterology

## 2022-06-06 DIAGNOSIS — K760 Fatty (change of) liver, not elsewhere classified: Secondary | ICD-10-CM

## 2022-06-12 ENCOUNTER — Ambulatory Visit
Admission: RE | Admit: 2022-06-12 | Discharge: 2022-06-12 | Disposition: A | Discharge status: Home / Self Care | Payer: Medicare Other | Source: Ambulatory Visit | Attending: Gastroenterology | Admitting: Gastroenterology

## 2022-06-12 ENCOUNTER — Other Ambulatory Visit: Payer: Medicare Other

## 2022-06-12 DIAGNOSIS — K760 Fatty (change of) liver, not elsewhere classified: Secondary | ICD-10-CM | POA: Diagnosis not present

## 2022-07-18 ENCOUNTER — Telehealth: Payer: Self-pay | Admitting: *Deleted

## 2022-07-18 ENCOUNTER — Telehealth: Payer: Self-pay | Admitting: Internal Medicine

## 2022-07-18 MED ORDER — AZITHROMYCIN 250 MG PO TABS: ORAL_TABLET | ORAL | 0 refills | Status: AC

## 2022-07-18 NOTE — Telephone Encounter (Signed)
Called and spoke with Brad from [facility].  Awaiting fax from [place].

## 2022-07-18 NOTE — Telephone Encounter (Signed)
Would like to know results of test from Sept of 2023. Please call to advise. TY.

## 2022-07-18 NOTE — Telephone Encounter (Signed)
We are seeing a lot of viral winter infections- Flu, Covid, RSV and others. Any testing that identifies something specific like Covid or Flu can help [place], but otherwise mostly we treat symptoms.  Order - Zpak 250 mg, # 6, 2 today then one daily Recommend - otc cold and flu symptom meds, Sudafed, Mucinex, Delsym cough syrup. Stay warm, well-hydrated, and try not to spread it to anyone else.  Call us as needed. If you get much worse go to ED.

## 2022-07-18 NOTE — Telephone Encounter (Signed)
Called and spoke with patient, she states she did have a home covid test, took it and it was negative.  Provided recommendations per Dr. Annamaria Boots.  She verbalized understanding.  Nothing further needed.

## 2022-07-18 NOTE — Telephone Encounter (Signed)
Called Brad with [employer] regarding ONO.  He is sending a message to follow up on results.  Will await fax.

## 2022-07-18 NOTE — Telephone Encounter (Signed)
Primary Pulmonologist: Emily Melton Last office visit and with whom: 04/01/2023 Emily Melton What do we see them for (pulmonary problems): Nocturnal hypoxemia, COPD mixed, primary narcolepsy Last OV assessment/plan:    Assessment & Plan Note by Deneise Lever, MD at 03/31/2022 3:41 PM  Author: Deneise Lever, MD Author Type: Physician Filed: 03/31/2022  3:42 PM  Note Status: Written Cosign: Cosign Not Required Encounter Date: 03/31/2022  Problem: Narcolepsy  Editor: Deneise Lever, MD (Physician)             Benefits from Dexedrine used as directed.  Had not tolerated some alternatives.  Does sometimes need help mid afternoon. Plan-short naps.  Trial of caffeine tablet occasionally.        Assessment & Plan Note by Deneise Lever, MD at 03/31/2022 3:40 PM  Author: Deneise Lever, MD Author Type: Physician Filed: 03/31/2022  3:40 PM  Note Status: Written Cosign: Cosign Not Required Encounter Date: 03/31/2022  Problem: COPD mixed type [facility])  Editor: Deneise Lever, MD (Physician)             Oxygen status with sleep needs clarification. Plan-overnight oximetry on room air.        Patient Instructions by Deneise Lever, MD at 03/31/2022 11:00 AM  Author: Deneise Lever, MD Author Type: Physician Filed: 03/31/2022 11:42 AM  Note Status: Signed Cosign: Cosign Not Required Encounter Date: 03/31/2022  Editor: Deneise Lever, MD (Physician)             Order- schedule overnight oximetry on room air     dx nocturnal hypoxemia   Script sent refilling Dexedrine   For the afternoon fatigue, try a short nap ( 30 minutes) or maybe an otc caffeine tablet like NoDoz   Flu shot- done   Please call if we can help       Orthostatic Vitals Recorded in This Encounter   03/31/2022 1124     BP Location: Left Arm   Instructions   Return in about 1 year (around 04/01/2023). Order- schedule overnight oximetry on room air     dx nocturnal hypoxemia   Script sent refilling Dexedrine   For  the afternoon fatigue, try a short nap ( 30 minutes) or maybe an otc caffeine tablet like NoDoz   Flu shot- done   Please call if we can help       Was appointment offered to patient (explain)?  no   Reason for call: Called and spoke with patient, [place].  Her granddaughter was sick while she was visiting (she went to the pediatrician, but was not tested for flu or covid.  Treated for ear infection).  She states that the family had gotten over the flu before she arrived.  She arrived on 12/21 and began having symptoms on 12/23.  She started with a sore throat and then a scratchy throat.  She developed swollen glands under her jaw and chin.  She has had a runny nose and sinus congestion.  She has not had a fever, but has had chills and body aches.  She has an intense cough with white mucous with some yellow mixed in.  She is very tired and more sob than what is normal for her.  She has used her Albuterol inhaler for sob and chest tightness.  She has a congested nose with thick white mucous with yellow mixed in.  She has a lot of sinus pressure,  she has taken pseudafed.  She said she took 2 yesterday and go some relief and has taken some today just before my call.  She is eating and drinking and has not lost her sense of taste or smell.  I asked her to take a home covid test if she has one to rule out Covid.   She said she would do so.  Dr. Annamaria Boots, please advise.  Thank you.  (examples of things to ask: : When did symptoms start? Fever? Cough? Productive? Color to sputum? More sputum than usual? Wheezing? Have you needed increased oxygen? Are you taking your respiratory medications? What over the counter measures have you tried?)  Allergies  Allergen Reactions   Duloxetine Hcl Other (See Comments)    Hypertension, mood changes   Mirtazapine Swelling   Serotonin Reuptake Inhibitors (Ssris) Other (See Comments)    Hypertension, mood changes   St  Johns Wort Anaphylaxis   Bupropion Hcl Other (See Comments)    Mental changes and high blood pressure    Celebrex [Celecoxib] Hypertension   Effexor [Venlafaxine] Other (See Comments)    Moodiness, vivid dreams, poor sleep cycle.   Gralise [Gabapentin (Once-Daily)] Cough   Levothyroxine Sodium Other (See Comments)    Pt reports unable to take 75 mg - heartburn   Lyrica [Pregabalin] Other (See Comments) and Hypertension    Mood changes, eye twitching and UTI   Methylphenidate Hcl Other (See Comments)    unknown   Mirapex [Pramipexole]     HTN, hallucinations and nausea   Modafinil Swelling and Hypertension   Nexium [Esomeprazole]     GI side effects   Rabeprazole Sodium Swelling and Other (See Comments)    Headaches    Savella [Milnacipran Hcl] Other (See Comments)   Zegerid [Omeprazole] Other (See Comments)   Lisinopril Rash    Hypertension    Immunization History  Administered Date(s) Administered   Hepatitis A, Adult 06/06/2018, 12/12/2018   Hepb-cpg 06/06/2018, 09/10/2018   Influenza Split 06/21/2011   Influenza,inj,Quad PF,6+ Mos 04/30/2013, 05/22/2014, 04/05/2015, 04/20/2016, 04/20/2017, 03/14/2018, 03/14/2019, 03/31/2020, 03/31/2021, 03/31/2022   Influenza-Unspecified 04/30/2013, 05/22/2014, 04/05/2015, 04/20/2016, 04/20/2017, 03/14/2018, 03/14/2019, 03/31/2020, 03/31/2021   PFIZER Comirnaty(Gray Top)Covid-19 Tri-Sucrose Vaccine 10/16/2019, 11/11/2019, 06/18/2020, 12/21/2020   PFIZER(Purple Top)SARS-COV-2 Vaccination 10/16/2019, 11/11/2019, 06/18/2020   Pfizer Covid-19 Vaccine Bivalent Booster 4yr & up 04/25/2021   Pneumococcal Conjugate-13 03/31/2021   Pneumococcal Polysaccharide-23 04/26/2010, 07/18/2011   Tdap 08/01/2010, 01/11/2021   Zoster Recombinat (Shingrix) 09/10/2018

## 2022-07-18 NOTE — Telephone Encounter (Signed)
No fax received from [employer] as of yet with results of ONO.  Called and spoke with patient, advised that I am awaiting the results of the ONO from [employer] and once we have those results we will call her back.  She verbalized understanding.

## 2022-07-19 NOTE — Telephone Encounter (Signed)
Patient is returning phone call. Patient phone number is [phone].

## 2022-07-19 NOTE — Telephone Encounter (Signed)
Please refer to second phone encounter from 1/2.

## 2022-07-19 NOTE — Telephone Encounter (Signed)
Called and spoke with pt letting her know that we had reached out to [employer] to have results of ONO sent to our office. Stated to her that they are sending a message to their people to check on this for [place].  Pt said she did find a phone number for [employer] that she called and spoke with them about this. Pt said she told them that we never received the results and she said they told her they would get them refaxed to our office. Will await the fax of the results.

## 2022-07-21 NOTE — Telephone Encounter (Signed)
I am putting ONOX report in to scan. It showed sustained low oxygen with saturation 88% or les for 6 hour on room air on 04/07/22.  I don't think she has home O2.  Order- new DME, new O2 for sleep 2 liters/ min     dx Nocturnal Hypoxemia

## 2022-07-21 NOTE — Telephone Encounter (Signed)
PT calling again asking about ONO results. States she also called [employer] again and they were going to fax them over. Pls call to advise @ [phone] and/or check for the receipt of that fax for her.

## 2022-07-24 ENCOUNTER — Other Ambulatory Visit: Payer: Self-pay

## 2022-07-24 DIAGNOSIS — G4734 Idiopathic sleep related nonobstructive alveolar hypoventilation: Secondary | ICD-10-CM

## 2022-07-26 ENCOUNTER — Other Ambulatory Visit: Payer: Self-pay

## 2022-07-26 DIAGNOSIS — G4734 Idiopathic sleep related nonobstructive alveolar hypoventilation: Secondary | ICD-10-CM

## 2022-07-26 NOTE — Telephone Encounter (Signed)
Patient is calling because she would like the doctor to call her to discuss the results of her test.  She stated that no one has called her to discuss.  Please advise and call patient to discuss.  CB# [phone]

## 2022-07-31 ENCOUNTER — Encounter: Payer: Self-pay | Admitting: Internal Medicine

## 2022-07-31 ENCOUNTER — Telehealth: Payer: Self-pay | Admitting: Internal Medicine

## 2022-07-31 DIAGNOSIS — G4734 Idiopathic sleep related nonobstructive alveolar hypoventilation: Secondary | ICD-10-CM

## 2022-07-31 NOTE — Telephone Encounter (Signed)
Called and spoke with patient. She verbalized understanding of results. I explained to her the nocturnal usage of oxygen. I did advise her that an order for O2 had been placed on 01/08 and 01/10 but had been rejected due to the test being more than 72 days old. She stated that she did not speak with anyone on 01/08 or 01/10 about oxygen being ordered. She is ok with redoing the ONO.   Dr. Annamaria Boots, are you ok with [place] reordering the ONO? Thanks!

## 2022-08-01 NOTE — Telephone Encounter (Signed)
Called and spoke with patient. She is aware that I will place the order. Order has been placed.   Nothing further needed at time of call.

## 2022-08-01 NOTE — Telephone Encounter (Signed)
Yes please reorder overnight oximetry. I like to repeat these anyway to double check.

## 2022-09-01 ENCOUNTER — Encounter: Payer: Self-pay | Admitting: *Deleted

## 2022-09-04 ENCOUNTER — Ambulatory Visit: Payer: Medicare Other | Admitting: Anesthesiology

## 2022-09-04 ENCOUNTER — Encounter
Admission: RE | Disposition: A | Discharge status: Home / Self Care | Payer: Self-pay | Source: Home / Self Care | Attending: Gastroenterology

## 2022-09-04 ENCOUNTER — Encounter: Payer: Self-pay | Admitting: *Deleted

## 2022-09-04 ENCOUNTER — Ambulatory Visit
Admission: RE | Admit: 2022-09-04 | Discharge: 2022-09-04 | Disposition: A | Discharge status: Home / Self Care | Payer: Medicare Other | Attending: Gastroenterology | Admitting: Gastroenterology

## 2022-09-04 DIAGNOSIS — I10 Essential (primary) hypertension: Secondary | ICD-10-CM | POA: Insufficient documentation

## 2022-09-04 DIAGNOSIS — K648 Other hemorrhoids: Secondary | ICD-10-CM | POA: Insufficient documentation

## 2022-09-04 DIAGNOSIS — E039 Hypothyroidism, unspecified: Secondary | ICD-10-CM | POA: Insufficient documentation

## 2022-09-04 DIAGNOSIS — Z1211 Encounter for screening for malignant neoplasm of colon: Secondary | ICD-10-CM | POA: Diagnosis not present

## 2022-09-04 DIAGNOSIS — Z9049 Acquired absence of other specified parts of digestive tract: Secondary | ICD-10-CM | POA: Diagnosis not present

## 2022-09-04 DIAGNOSIS — D126 Benign neoplasm of colon, unspecified: Secondary | ICD-10-CM | POA: Insufficient documentation

## 2022-09-04 DIAGNOSIS — K5730 Diverticulosis of large intestine without perforation or abscess without bleeding: Secondary | ICD-10-CM | POA: Insufficient documentation

## 2022-09-04 DIAGNOSIS — Z6841 Body Mass Index (BMI) 40.0 and over, adult: Secondary | ICD-10-CM | POA: Diagnosis not present

## 2022-09-04 DIAGNOSIS — K640 First degree hemorrhoids: Secondary | ICD-10-CM | POA: Insufficient documentation

## 2022-09-04 DIAGNOSIS — Z87891 Personal history of nicotine dependence: Secondary | ICD-10-CM | POA: Diagnosis not present

## 2022-09-04 DIAGNOSIS — D175 Benign lipomatous neoplasm of intra-abdominal organs: Secondary | ICD-10-CM | POA: Insufficient documentation

## 2022-09-04 HISTORY — PX: COLONOSCOPY WITH PROPOFOL: SHX5780

## 2022-09-04 SURGERY — COLONOSCOPY WITH PROPOFOL
Anesthesia: General

## 2022-09-04 MED ORDER — EPHEDRINE SULFATE (PRESSORS) 50 MG/ML IJ SOLN
INTRAMUSCULAR | Status: DC | PRN
  Administered 2022-09-04: 10 mg via INTRAVENOUS

## 2022-09-04 MED ORDER — LIDOCAINE HCL (CARDIAC) PF 100 MG/5ML IV SOSY
PREFILLED_SYRINGE | INTRAVENOUS | Status: DC | PRN
  Administered 2022-09-04: 50 mg via INTRAVENOUS

## 2022-09-04 MED ORDER — PROPOFOL 10 MG/ML IV BOLUS
INTRAVENOUS | Status: DC | PRN
  Administered 2022-09-04: 20 mg via INTRAVENOUS
  Administered 2022-09-04: 70 mg via INTRAVENOUS
  Administered 2022-09-04: 30 mg via INTRAVENOUS

## 2022-09-04 MED ORDER — PROPOFOL 500 MG/50ML IV EMUL
INTRAVENOUS | Status: DC | PRN
  Administered 2022-09-04: 150 ug/kg/min via INTRAVENOUS

## 2022-09-04 MED ORDER — SODIUM CHLORIDE 0.9 % IV SOLN
INTRAVENOUS | Status: DC
  Administered 2022-09-04: 1000 mL via INTRAVENOUS

## 2022-09-04 NOTE — Anesthesia Preprocedure Evaluation (Addendum)
Anesthesia Evaluation  Patient identified by MRN, date of birth, ID band Patient awake    Reviewed: Allergy & Precautions, NPO status , Patient's Chart, lab work & pertinent test results  History of Anesthesia Complications Negative for: history of anesthetic complications  Airway Mallampati: IV   Neck ROM: Full    Dental  (+) Missing   Pulmonary former smoker (quit 2015)   Pulmonary exam normal breath sounds clear to auscultation       Cardiovascular hypertension, Normal cardiovascular exam Rhythm:Regular Rate:Normal  ECG 03/29/22:  SINUS BRADYCARDIA WITH SINUS ARRHYTHMIA  CANNOT RULE OUT ANTERIOR INFARCT  , AGE UNDETERMINED     Neuro/Psych  Headaches PSYCHIATRIC DISORDERS  Depression    Narcolepsy     GI/Hepatic ,GERD (Barrett esophagus)  ,,  Endo/Other  Hypothyroidism  Prediabetes; class 3 obesity   Renal/GU      Musculoskeletal  (+) Arthritis ,  Fibromyalgia -  Abdominal   Peds  Hematology  (+) Blood dyscrasia, anemia   Anesthesia Other Findings   Reproductive/Obstetrics                             Anesthesia Physical Anesthesia Plan  ASA: 3  Anesthesia Plan: General   Post-op Pain Management:    Induction: Intravenous  PONV Risk Score and Plan: 3 and Propofol infusion, TIVA and Treatment may vary due to age or medical condition  Airway Management Planned: Natural Airway  Additional Equipment:   Intra-op Plan:   Post-operative Plan:   Informed Consent: I have reviewed the patients History and Physical, chart, labs and discussed the procedure including the risks, benefits and alternatives for the proposed anesthesia with the patient or authorized representative who has indicated his/her understanding and acceptance.       Plan Discussed with: CRNA  Anesthesia Plan Comments: (LMA/GETA backup discussed.  Patient consented for risks of anesthesia including but not  limited to:  - adverse reactions to medications - damage to eyes, teeth, lips or other oral mucosa - nerve damage due to positioning  - sore throat or hoarseness - damage to heart, brain, nerves, lungs, other parts of body or loss of life  Informed patient about role of CRNA in peri- and intra-operative care.  Patient voiced understanding.)        Anesthesia Quick Evaluation

## 2022-09-04 NOTE — H&P (Signed)
Outpatient short stay form Pre-procedure 09/04/2022  Lesly Rubenstein, MD  Primary Physician: Wardell Honour, MD  Reason for visit:  Surveillance  History of present illness:    62 y/o lady with history of morbid obesity, hypothyroidism, and hypertension here for colonoscopy due to history of large polyp. No blood thinners. No first degree relatives with colon cancer. History of cholecystectomy, oopherectomy, and hysterectomy.    Current Facility-Administered Medications:    0.9 %  sodium chloride infusion, , Intravenous, Continuous, Haadiya Frogge, Hilton Cork, MD, Last Rate: 20 mL/hr at 09/04/22 0913, 1,000 mL at 09/04/22 0913  Medications Prior to Admission  Medication Sig Dispense Refill Last Dose   amLODipine (NORVASC) 5 MG tablet Take 5 mg by mouth daily.   09/03/2022   cetirizine (ZYRTEC) 10 MG tablet Take 10 mg by mouth at bedtime.    09/03/2022   Cholecalciferol (D3-1000 PO)    09/03/2022   ciclopirox (LOPROX) 0.77 % cream Apply topically 2 (two) times daily. Bid to feet prn flares 90 g 3 09/03/2022   levothyroxine (SYNTHROID, LEVOTHROID) 50 MCG tablet Take 75 mcg by mouth daily before breakfast.    09/04/2022 at 0500   lovastatin (MEVACOR) 20 MG tablet Take 20 mg by mouth daily.   09/03/2022   montelukast (SINGULAIR) 10 MG tablet Take 1 tablet (10 mg total) by mouth at bedtime. 90 tablet 3 09/03/2022   propranolol (INDERAL) 20 MG tablet Take 3 tablets (60 mg total) by mouth 2 (two) times daily. 540 tablet 3 09/04/2022 at 0530   tiZANidine (ZANAFLEX) 4 MG tablet Take 1 tablet (4 mg total) by mouth every 6 (six) hours as needed for muscle spasms. 60 tablet 2 09/03/2022 at 0530   albuterol (VENTOLIN HFA) 108 (90 Base) MCG/ACT inhaler INHALE 2 PUFFS INTO THE LUNGS EVERY 4 HOURS AS NEEDED 8.5 g 12  at prn   azithromycin (ZITHROMAX) 250 MG tablet Take 2 tablets today and then 1 tablet daily until gone. (Patient not taking: Reported on 09/04/2022) 6 tablet 0 Completed Course   ciclopirox (PENLAC) 8  % solution Apply topically at bedtime. Apply over nail. Apply daily over previous coat. After seven (7) days, may remove with nail polish remover and continue cycle. 6 mL 11    dextroamphetamine (DEXEDRINE) 10 MG 24 hr capsule 1 or 2 by mouth daily as needed 60 capsule 0  at prn   dicyclomine (BENTYL) 10 MG capsule Take 1 capsule (10 mg total) by mouth 3 (three) times daily as needed for up to 14 days for spasms. 42 capsule 0    olmesartan-hydrochlorothiazide (BENICAR HCT) 40-12.5 MG tablet Take 1 tablet by mouth daily.        Allergies  Allergen Reactions   Duloxetine Hcl Other (See Comments)    Hypertension, mood changes   Mirtazapine Swelling   Serotonin Reuptake Inhibitors (Ssris) Other (See Comments)    Hypertension, mood changes   St Johns Wort Anaphylaxis   Bupropion Hcl Other (See Comments)    Mental changes and high blood pressure    Celebrex [Celecoxib] Hypertension   Effexor [Venlafaxine] Other (See Comments)    Moodiness, vivid dreams, poor sleep cycle.   Gralise [Gabapentin (Once-Daily)] Cough   Levothyroxine Sodium Other (See Comments)    Pt reports unable to take 75 mg - heartburn   Lyrica [Pregabalin] Other (See Comments) and Hypertension    Mood changes, eye twitching and UTI   Methylphenidate Hcl Other (See Comments)    unknown   Mirapex [Pramipexole]  HTN, hallucinations and nausea   Modafinil Swelling and Hypertension   Nexium [Esomeprazole]     GI side effects   Rabeprazole Sodium Swelling and Other (See Comments)    Headaches    Savella [Milnacipran Hcl] Other (See Comments)   Zegerid [Omeprazole] Other (See Comments)   Lisinopril Rash    Hypertension     Past Medical History:  Diagnosis Date   Allergic rhinitis, cause unspecified    Allergy    Anemia    Arthritis    C-spine; 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    Barrett's esophagus    Cervical radiculopathy    Chicken pox    childhood   Depression     Diaphragmatic hernia without mention of obstruction or gangrene    Fibromyalgia    Fibromyalgia    GERD (gastroesophageal reflux disease)    HTN (hypertension)    Insomnia, unspecified    Migraine, unspecified, without mention of intractable migraine without mention of status migrainosus    Narcolepsy    w/o cataplexy   Nonspecific abnormal electrocardiogram (ECG) (EKG)    Osteoporosis    Other abnormal glucose    Periodic limb movement disorder    Personal history of colonic polyps    Postlaminectomy syndrome of cervical region    Tobacco use disorder    Unspecified vitamin D deficiency     Review of systems:  Otherwise negative.    Physical Exam  Gen: Alert, oriented. Appears stated age.  HEENT: PERRLA. Lungs: No respiratory distress CV: RRR Abd: soft, benign, no masses Ext: No edema    Planned procedures: Proceed with colonoscopy. The patient understands the nature of the planned procedure, indications, risks, alternatives and potential complications including but not limited to bleeding, infection, perforation, damage to internal organs and possible oversedation/side effects from anesthesia. The patient agrees and gives consent to proceed.  Please refer to procedure notes for findings, recommendations and patient disposition/instructions.     Lesly Rubenstein, MD [facility]

## 2022-09-04 NOTE — Anesthesia Procedure Notes (Signed)
Date/Time: 09/04/2022 9:30 AM  Performed by: Johnna Acosta, CRNAPre-anesthesia Checklist: Patient identified, Emergency Drugs available, Suction available, Patient being monitored and Timeout performed Patient Re-evaluated:Patient Re-evaluated prior to induction Oxygen Delivery Method: Nasal cannula Preoxygenation: Pre-oxygenation with 100% oxygen Induction Type: IV induction

## 2022-09-04 NOTE — Op Note (Signed)
[facility] Patient Name: Emily Melton Procedure Date: 09/04/2022 9:11 AM MRN: [id number] Account #: [id number] Date of Birth: [birth year] Admit Type: Outpatient Age: 62 Room: [facility] ENDO ROOM 3 Gender: Female Note Status: Finalized Instrument Name: Jasper Riling [id number] Procedure:             Colonoscopy Indications:           High risk colon cancer surveillance: Personal history                         of colonic polyps, Last colonoscopy 5 years ago Providers:             Andrey Farmer MD, MD Medicines:             Monitored Anesthesia Care Complications:         No immediate complications. Estimated blood loss:                         Minimal. Procedure:             Pre-Anesthesia Assessment:                        - Prior to the procedure, a History and Physical was                         performed, and patient medications and allergies were                         reviewed. The patient is competent. The risks and                         benefits of the procedure and the sedation options and                         risks were discussed with the patient. All questions                         were answered and informed consent was obtained.                         Patient identification and proposed procedure were                         verified by the physician, the nurse, the                         anesthesiologist, the anesthetist and the technician                         in the endoscopy suite. Mental Status Examination:                         alert and oriented. Airway Examination: normal                         oropharyngeal airway and neck mobility. Respiratory                         Examination: clear to auscultation. CV Examination:  normal. Prophylactic Antibiotics: The patient does not                         require prophylactic antibiotics. Prior                         Anticoagulants: The patient has taken  no anticoagulant                         or antiplatelet agents. ASA Grade Assessment: III - A                         patient with severe systemic disease. After reviewing                         the risks and benefits, the patient was deemed in                         satisfactory condition to undergo the procedure. The                         anesthesia plan was to use monitored anesthesia care                         (MAC). Immediately prior to administration of                         medications, the patient was re-assessed for adequacy                         to receive sedatives. The heart rate, respiratory                         rate, oxygen saturations, blood pressure, adequacy of                         pulmonary ventilation, and response to care were                         monitored throughout the procedure. The physical                         status of the patient was re-assessed after the                         procedure.                        After obtaining informed consent, the colonoscope was                         passed under direct vision. Throughout the procedure,                         the patient's blood pressure, pulse, and oxygen                         saturations were monitored continuously. The  Colonoscope was introduced through the anus and                         advanced to the the cecum, identified by appendiceal                         orifice and ileocecal valve. The colonoscopy was                         somewhat difficult due to significant looping. The                         patient tolerated the procedure well. The quality of                         the bowel preparation was adequate to identify polyps.                         The ileocecal valve, appendiceal orifice, and rectum                         were photographed. Findings:      The perianal and digital rectal examinations were normal.      A 5 mm polyp was  found in the ascending colon. The polyp was sessile.       The polyp was removed with a cold snare. Resection was complete, but the       polyp tissue was not retrieved. Estimated blood loss was minimal.      There was a medium-sized lipoma, in the descending colon.      Multiple small-mouthed diverticula were found in the sigmoid colon.      A small polyp was found in the recto-sigmoid colon. The polyp was       hyperplastic. There were several other hyperplastic appearing polyps in       the rectum. The polyp was removed with a cold snare. Resection and       retrieval were complete. Estimated blood loss was minimal.      Internal hemorrhoids were found during retroflexion. The hemorrhoids       were Grade I (internal hemorrhoids that do not prolapse).      The exam was otherwise without abnormality on direct and retroflexion       views. Impression:            - One 5 mm polyp in the ascending colon, removed with                         a cold snare. Complete resection. Polyp tissue not                         retrieved.                        - Medium-sized lipoma in the descending colon.                        - Diverticulosis in the sigmoid colon.                        - One small polyp at  the recto-sigmoid colon, removed                         with a cold snare. Resected and retrieved.                        - Internal hemorrhoids.                        - The examination was otherwise normal on direct and                         retroflexion views. Recommendation:        - Discharge patient to home.                        - Resume previous diet.                        - Continue present medications.                        - Await pathology results.                        - Repeat colonoscopy in 5 years for surveillance.                        - Return to referring physician as previously                         scheduled. Procedure Code(s):     --- Professional ---                         [phone], Colonoscopy, flexible; with removal of                         tumor(s), polyp(s), or other lesion(s) by snare                         technique Diagnosis Code(s):     --- Professional ---                        Z86.010, Personal history of colonic polyps                        D12.2, Benign neoplasm of ascending colon                        D12.7, Benign neoplasm of rectosigmoid junction                        D17.5, Benign lipomatous neoplasm of intra-abdominal                         organs                        K64.0, First degree hemorrhoids                        K57.30, Diverticulosis of large intestine without  perforation or abscess without bleeding CPT copyright 2022 American Medical Association. All rights reserved. The codes documented in this report are preliminary and upon coder review may  be revised to meet current compliance requirements. Andrey Farmer MD, MD 09/04/2022 10:03:40 AM Number of Addenda: 0 Note Initiated On: 09/04/2022 9:11 AM Scope Withdrawal Time: 0 hours 13 minutes 54 seconds  Total Procedure Duration: 0 hours 21 minutes 16 seconds  Estimated Blood Loss:  Estimated blood loss was minimal.      [facility]

## 2022-09-04 NOTE — Anesthesia Postprocedure Evaluation (Signed)
Anesthesia Post Note  Patient: Emily Melton  Procedure(s) Performed: COLONOSCOPY WITH PROPOFOL  Patient location during evaluation: PACU Anesthesia Type: General Level of consciousness: awake and alert, oriented and patient cooperative Pain management: pain level controlled Vital Signs Assessment: post-procedure vital signs reviewed and stable Respiratory status: spontaneous breathing, nonlabored ventilation and respiratory function stable Cardiovascular status: blood pressure returned to baseline and stable Postop Assessment: adequate PO intake Anesthetic complications: no   No notable events documented.   Last Vitals:  Vitals:   09/04/22 1003 09/04/22 1028  BP: 135/64 (!) 146/94  Pulse: 81   Resp: (!) 24   Temp:    SpO2: 95% 98%    Last Pain:  Vitals:   09/04/22 1028  TempSrc:   PainSc: 0-No pain                 Darrin Nipper

## 2022-09-04 NOTE — Interval H&P Note (Signed)
History and Physical Interval Note:  09/04/2022 9:29 AM  Emily Melton  has presented today for surgery, with the diagnosis of HX OF ADENOMATOUS POLYP OF COLON.  The various methods of treatment have been discussed with the patient and family. After consideration of risks, benefits and other options for treatment, the patient has consented to  Procedure(s): COLONOSCOPY WITH PROPOFOL (N/A) as a surgical intervention.  The patient's history has been reviewed, patient examined, no change in status, stable for surgery.  I have reviewed the patient's chart and labs.  Questions were answered to the patient's satisfaction.     Lesly Rubenstein  Ok to proceed with colonoscopy

## 2022-09-04 NOTE — Transfer of Care (Signed)
Immediate Anesthesia Transfer of Care Note  Patient: Emily Melton  Procedure(s) Performed: COLONOSCOPY WITH PROPOFOL  Patient Location: Endoscopy Unit  Anesthesia Type:General  Level of Consciousness: awake, alert , and drowsy  Airway & Oxygen Therapy: Patient Spontanous Breathing  Post-op Assessment: Report given to RN and Post -op Vital signs reviewed and stable  Post vital signs: Reviewed and stable  Last Vitals:  Vitals Value Taken Time  BP 135/64 09/04/22 1003  Temp    Pulse 81 09/04/22 1003  Resp 24 09/04/22 1003  SpO2 95 % 09/04/22 1003    Last Pain:  Vitals:   09/04/22 1001  TempSrc:   PainSc: 0-No pain         Complications: No notable events documented.

## 2022-09-05 LAB — SURGICAL PATHOLOGY

## 2022-09-06 ENCOUNTER — Telehealth: Payer: Self-pay | Admitting: Internal Medicine

## 2022-09-06 NOTE — Telephone Encounter (Signed)
ATC patient. LVMTCB. 

## 2022-09-06 NOTE — Telephone Encounter (Signed)
PT ret call. Pls try again. She was in check out line.

## 2022-09-06 NOTE — Telephone Encounter (Signed)
Pt. Callling want results from overnight oximetry please advise pt.

## 2022-09-07 NOTE — Telephone Encounter (Signed)
Pt is requesting ONO results from Feb. 2024. I called [employer] and the confirmed they did have the results and would fax them.   Routing to [place] for f/u.

## 2022-09-07 NOTE — Telephone Encounter (Signed)
Spoke to patient.  She is requesting ONO results.   Dr. Annamaria Boots, please advise. Thanks

## 2022-09-07 NOTE — Telephone Encounter (Signed)
I don't think we have seen this overnight oximetry result. Please try to get from DME. Thanks

## 2022-09-07 NOTE — Telephone Encounter (Signed)
PT calling again for ONO results. Will advise what Dr. Darreld Mclean said in last note. Pls call @ [phone]  She is anxious because the test done last year results were lost, she said.

## 2022-09-08 NOTE — Telephone Encounter (Signed)
No results as of this morning. Will keep checking and call [employer] back later

## 2022-09-12 ENCOUNTER — Telehealth: Payer: Self-pay | Admitting: Internal Medicine

## 2022-09-12 ENCOUNTER — Other Ambulatory Visit: Payer: Self-pay

## 2022-09-12 DIAGNOSIS — G4734 Idiopathic sleep related nonobstructive alveolar hypoventilation: Secondary | ICD-10-CM

## 2022-09-12 NOTE — Telephone Encounter (Signed)
Went over [employer] with patient. Order has been placed for o2 at bedtime. Patient verbalized understanding. Nothing further needed.

## 2022-09-12 NOTE — Telephone Encounter (Signed)
Dr. Annamaria Boots can you please advise on ONO results

## 2022-09-12 NOTE — Telephone Encounter (Signed)
The overnight oximetry showed that her oxygen levels stayed below safe levels most of the night. She meets the medical guideline qualifying her to have home oxygen to wear while sleeping.  Order- DME new home O2  2L during sleep      dx Nocturnal hypoxemia

## 2022-09-12 NOTE — Telephone Encounter (Signed)
Patient would like results of overnight oximetry. Patient phone number is [phone].

## 2022-09-13 ENCOUNTER — Telehealth: Payer: Self-pay | Admitting: Internal Medicine

## 2022-09-14 ENCOUNTER — Telehealth: Payer: Self-pay | Admitting: Internal Medicine

## 2022-09-14 NOTE — Telephone Encounter (Signed)
[employer] needs ONO result in the last 30days sent to them spoke to patricia and she left her direct phone # and [phone]

## 2022-09-14 NOTE — Telephone Encounter (Signed)
Please advise 

## 2022-09-15 NOTE — Telephone Encounter (Signed)
Patient is aware of results will close encounter

## 2022-09-15 NOTE — Telephone Encounter (Signed)
Spoke with Emily Melton was able to get ONO scanned to chart. Nothing further needed.

## 2022-09-15 NOTE — Telephone Encounter (Signed)
Spoke with patient. Per [employer] they need a current OV to process o2 order. Okay to use held spot per Dr. Annamaria Boots was able to get her scheduled Monday. Nothing further needed.

## 2022-09-15 NOTE — Telephone Encounter (Signed)
Yes ok 

## 2022-09-15 NOTE — Telephone Encounter (Signed)
Mardene Celeste states needs office notes within 30 days of the order for oxygen. Mardene Celeste phone number is [phone].

## 2022-09-15 NOTE — Telephone Encounter (Signed)
Before O2 order can be completed patient is going to need a current office visit. She hasn't seen you since September are you okay with me using a held spot next week. Dr. Annamaria Boots please advise?

## 2022-09-16 NOTE — Progress Notes (Signed)
HPI  female former smoker followed for nocturnal hypoxia/minimal OSA, history Narcolepsy Sleep Study 2016 - Dr Rexene Alberts. History of Narcolepsy - diagnosed 2001- tried and failed Ritalin and Provigil. NPSG  11/30/14- [facility] AHI 5.3/ hr, desat to 83%, weight 216 lbs Study in 2001 in [place], [place], with report no longer available, included MSLT and she says diagnosed narcolepsy without sleep apnea. The 2016 sleep study was supposed to have included an MSLT but that didn't get done and she didn't like her experience there. Takes Zanaflex for cervical radiculopathy pain and it helps her sleep .Previous trials of Provigil and Ritalin both said to cause HBP and "swelling". She ended up on Dexedrine for a while. Tried Mirapex for vaguely defined limb jerks. This caused a lot of malaise and nausea. NPSG 10/19/2015-AHI 2.4 per hour/WNL, desaturation to 81%, moderate snoring, severe PLMS with arousal 6.3/hour MSLT- 10/20/2015-mean latency 3 minutes 43 seconds, 0/5 SOREM    C/W idiopathic hypersomnia History of recurrent bronchitis and chronic cough until she quit smoking. Several previous pneumonias .PFT 12/08/2015-moderate obstructive airways disease, insignificant response to bronchodilator, normal total lung capacity. They could not get an accurate diffusion capacity ONOX on room air 08/23/22- 7 hrd 23 min </= 88% ------------------------------------------------------------------------------------------------------   03/31/22- 62 year old female former smoker( 36 pk yr) followed for COPD, nocturnal hypoxia/ minimal OSA,  Narcolepsy, insomnia, complicated by GERD/Barrett's, Hx parathyroidectomy/partial thyroidectomy/ Obesity, HTN,  (Remotely ritalin helped, but may have had allergic reaction.Didn't like Provigil. No cataplexy.) Now questions if adderall gives headache.Tried Vyvanse 10 mg qAM-  -Ventolin hfa, Singulair, Dexedrine 10 mg      note Inderal Does not have CPAP or O2 Covid vax- 3  Phizer Flu vax-today standard Body weight today-241 lbs Seen at [facility] January for COPD exacerbation, which she says was just a cold. Dexedrine works well for her in the morning.  Sometimes at [facility] time and early afternoon she feels very tired and asks about ways to manage that.  We discussed short naps, not longer than 30 minutes, and a trial of OTC caffeine tablet. We want to clarify her nocturnal oxygen and general pulmonary status.  Eventually we may need to update a sleep study. Standard flu vaccine today. CT Chest Lung Cancer Screen 01/18/22- IMPRESSION: 1. Lung-RADS 2, benign appearance or behavior. Continue annual screening with low-dose chest CT without contrast in 12 months. 2. Hepatic steatosis. 3.  Aortic atherosclerosis (ICD10-I70.0). 4. Enlarged pulmonic trunk, indicative of pulmonary arterial hypertension. 5.  Emphysema (ICD10-J43.9).  09/18/27- 62 year old female former smoker( 36 pk yr) followed for COPD, Nocturnal Hypoxia/ minimal OSA,  Narcolepsy, Insomnia, complicated by GERD/Barrett's, Hx parathyroidectomy/partial thyroidectomy/ Obesity, HTN,  (Remotely ritalin helped, but may have had allergic reaction.Didn't like Provigil. No cataplexy.) Now questions if adderall gives headache.Tried Vyvanse 10 mg qAM-  -Ventolin hfa, Singulair, Dexedrine 10 mg      note Inderal Does not have CPAP  Covid vax- 5 Phizer Flu vax had Body weight today-245 lbs ONOX on room air 08/23/22- 7 hrd 23 min </= 88% O2 2L Sleep/ [facility]  Screening CT due July Rarely needs rescue inhaler-maybe once every 2 weeks. Continues to feel Dexedrine works better than other stimulants tried for managing daytime sleepiness.  Together with occasional naps and attention to nighttime sleep habits, she feels stable.  ROS-see HPI  + = positive Constitutional:    weight loss, night sweats, fevers, chills, + fatigue, lassitude. HEENT:    headaches, difficulty swallowing, tooth/dental problems, sore throat,  sneezing, itching, ear ache, nasal congestion, post nasal drip, snoring CV:    chest pain, orthopnea, PND, swelling in lower extremities, anasarca,                                                     dizziness, palpitations Resp:   shortness of breath with exertion or at rest.                +roductive cough,   non-productive cough, coughing up of blood.              change in color of mucus.  wheezing.   Skin:    rash or lesions. GI:  No-   heartburn, indigestion, abdominal pain, nausea, vomiting, diarrhea,                 change in bowel habits, loss of appetite GU: dysuria, change in color of urine, no urgency or frequency.   flank pain. MS:   joint pain, stiffness, decreased range of motion, + back pain. Neuro-     nothing unusual Psych:  change in mood or affect.  depression or anxiety.   memory loss.  OBJ- Physical Exam General- Alert, Oriented, Affect-appropriate, Distress- none acute, + obese Skin- rash-none, lesions- none, excoriation- none Lymphadenopathy- none Head- atraumatic            Eyes- Gross vision intact, PERRLA, conjunctivae and secretions clear            Ears- Hearing, canals-normal            Nose- Clear, no-Septal dev, mucus, polyps, erosion, perforation             Throat- Mallampati III , mucosa clear , drainage- none, tonsils- atrophic Neck- flexible , trachea midline, no stridor , thyroid nl, carotid no bruit Chest - symmetrical excursion , unlabored           Heart/CV- RRR , no murmur , no gallop  , no rub, nl s1 s2                           - JVD- none , edema- none, stasis changes- none, varices- none           Lung- clear to P&A, wheeze- none, cough- none , dullness-none, rub- none           Chest wall-  Abd-  Br/ Gen/ Rectal- Not done, not indicated Extrem- cyanosis- none, clubbing, none, atrophy- none, strength- nl Neuro- grossly intact to observation

## 2022-09-18 ENCOUNTER — Ambulatory Visit: Payer: Medicare Other | Admitting: Internal Medicine

## 2022-09-18 ENCOUNTER — Encounter: Payer: Self-pay | Admitting: Internal Medicine

## 2022-09-18 VITALS — BP 138/70 | HR 54 | Temp 98.0°F | Ht 62.0 in | Wt 245.8 lb

## 2022-09-18 DIAGNOSIS — G47419 Narcolepsy without cataplexy: Secondary | ICD-10-CM | POA: Diagnosis not present

## 2022-09-18 DIAGNOSIS — G4734 Idiopathic sleep related nonobstructive alveolar hypoventilation: Secondary | ICD-10-CM | POA: Diagnosis not present

## 2022-09-18 DIAGNOSIS — J449 Chronic obstructive pulmonary disease, unspecified: Secondary | ICD-10-CM | POA: Diagnosis not present

## 2022-09-18 NOTE — Patient Instructions (Signed)
Order- DME [facility]- order home O2 2L for sleep, based on ONOX done 08/28/22 (VirtuOx)  dx Nocturnal Hypoxemia  Please let us know if we can help

## 2022-10-04 ENCOUNTER — Encounter: Payer: Self-pay | Admitting: Internal Medicine

## 2022-10-04 DIAGNOSIS — G4734 Idiopathic sleep related nonobstructive alveolar hypoventilation: Secondary | ICD-10-CM | POA: Insufficient documentation

## 2022-10-04 NOTE — Assessment & Plan Note (Addendum)
O2 2 L sleep/[facility]

## 2022-10-04 NOTE — Assessment & Plan Note (Signed)
Little bronchospasm.  Needing rescue inhaler only very occasionally. Plan-continue current meds.

## 2022-10-04 NOTE — Assessment & Plan Note (Signed)
Fairly stable with attention to naps and appropriate use of Dexedrine as. Plan-continue Dexedrine

## 2022-11-29 ENCOUNTER — Other Ambulatory Visit: Payer: Self-pay | Admitting: Gastroenterology

## 2022-11-29 DIAGNOSIS — K760 Fatty (change of) liver, not elsewhere classified: Secondary | ICD-10-CM

## 2022-12-16 ENCOUNTER — Other Ambulatory Visit: Payer: Self-pay | Admitting: Acute Care

## 2022-12-16 DIAGNOSIS — Z122 Encounter for screening for malignant neoplasm of respiratory organs: Secondary | ICD-10-CM

## 2022-12-16 DIAGNOSIS — Z87891 Personal history of nicotine dependence: Secondary | ICD-10-CM

## 2022-12-18 ENCOUNTER — Ambulatory Visit
Admission: RE | Admit: 2022-12-18 | Discharge: 2022-12-18 | Disposition: A | Discharge status: Home / Self Care | Payer: Medicare Other | Source: Ambulatory Visit | Attending: Gastroenterology | Admitting: Gastroenterology

## 2022-12-18 DIAGNOSIS — K760 Fatty (change of) liver, not elsewhere classified: Secondary | ICD-10-CM

## 2022-12-20 ENCOUNTER — Other Ambulatory Visit: Payer: Medicare Other

## 2022-12-21 ENCOUNTER — Other Ambulatory Visit: Payer: Medicare Other

## 2023-01-19 ENCOUNTER — Ambulatory Visit
Admission: RE | Admit: 2023-01-19 | Discharge: 2023-01-19 | Disposition: A | Discharge status: Home / Self Care | Payer: Medicare Other | Source: Ambulatory Visit | Attending: Family Medicine | Admitting: Family Medicine

## 2023-01-19 DIAGNOSIS — Z87891 Personal history of nicotine dependence: Secondary | ICD-10-CM | POA: Diagnosis not present

## 2023-01-19 DIAGNOSIS — Z122 Encounter for screening for malignant neoplasm of respiratory organs: Secondary | ICD-10-CM | POA: Diagnosis present

## 2023-01-22 ENCOUNTER — Other Ambulatory Visit: Payer: Self-pay | Admitting: Acute Care

## 2023-01-22 DIAGNOSIS — Z122 Encounter for screening for malignant neoplasm of respiratory organs: Secondary | ICD-10-CM

## 2023-01-22 DIAGNOSIS — Z87891 Personal history of nicotine dependence: Secondary | ICD-10-CM

## 2023-02-19 ENCOUNTER — Ambulatory Visit (INDEPENDENT_AMBULATORY_CARE_PROVIDER_SITE_OTHER): Payer: Medicare Other | Admitting: Dermatology

## 2023-02-19 VITALS — BP 189/88 | HR 46

## 2023-02-19 DIAGNOSIS — W908XXA Exposure to other nonionizing radiation, initial encounter: Secondary | ICD-10-CM

## 2023-02-19 DIAGNOSIS — L820 Inflamed seborrheic keratosis: Secondary | ICD-10-CM | POA: Diagnosis not present

## 2023-02-19 DIAGNOSIS — L700 Acne vulgaris: Secondary | ICD-10-CM | POA: Diagnosis not present

## 2023-02-19 DIAGNOSIS — L821 Other seborrheic keratosis: Secondary | ICD-10-CM

## 2023-02-19 DIAGNOSIS — L304 Erythema intertrigo: Secondary | ICD-10-CM | POA: Diagnosis not present

## 2023-02-19 DIAGNOSIS — D2239 Melanocytic nevi of other parts of face: Secondary | ICD-10-CM

## 2023-02-19 DIAGNOSIS — Z1283 Encounter for screening for malignant neoplasm of skin: Secondary | ICD-10-CM

## 2023-02-19 DIAGNOSIS — L814 Other melanin hyperpigmentation: Secondary | ICD-10-CM

## 2023-02-19 DIAGNOSIS — D229 Melanocytic nevi, unspecified: Secondary | ICD-10-CM

## 2023-02-19 DIAGNOSIS — D2372 Other benign neoplasm of skin of left lower limb, including hip: Secondary | ICD-10-CM

## 2023-02-19 DIAGNOSIS — B351 Tinea unguium: Secondary | ICD-10-CM | POA: Diagnosis not present

## 2023-02-19 DIAGNOSIS — L578 Other skin changes due to chronic exposure to nonionizing radiation: Secondary | ICD-10-CM

## 2023-02-19 DIAGNOSIS — D239 Other benign neoplasm of skin, unspecified: Secondary | ICD-10-CM

## 2023-02-19 MED ORDER — KETOCONAZOLE 2 % EX CREA: TOPICAL_CREAM | CUTANEOUS | 5 refills | Status: DC

## 2023-02-19 MED ORDER — TAVABOROLE 5 % EX SOLN: CUTANEOUS | 11 refills | Status: DC

## 2023-02-19 NOTE — Progress Notes (Signed)
Follow-Up Visit   Subjective  Emily Melton is a 62 y.o. female who presents for the following: Skin Cancer Screening and Full Body Skin Exam  The patient presents for Total-Body Skin Exam (TBSE) for skin cancer screening and mole check. The patient has spots, moles and lesions to be evaluated, some may be new or changing and the patient may have concern these could be cancer.    The following portions of the chart were reviewed this encounter and updated as appropriate: medications, allergies, medical history  Review of Systems:  No other skin or systemic complaints except as noted in HPI or Assessment and Plan.  Objective  Well appearing patient in no apparent distress; mood and affect are within normal limits.  A full examination was performed including scalp, head, eyes, ears, nose, lips, neck, chest, axillae, abdomen, back, buttocks, bilateral upper extremities, bilateral lower extremities, hands, feet, fingers, toes, fingernails, and toenails. All findings within normal limits unless otherwise noted below.   Relevant physical exam findings are noted in the Assessment and Plan.  L lat mid back at braline x 1, L antecubitum x 1 (2) Stuck on waxy paps with erythema    Assessment & Plan   SKIN CANCER SCREENING PERFORMED TODAY.  ACTINIC DAMAGE - Chronic condition, secondary to cumulative UV/sun exposure - diffuse scaly erythematous macules with underlying dyspigmentation - Recommend daily broad spectrum sunscreen SPF 30+ to sun-exposed areas, reapply every 2 hours as needed.  - Staying in the shade or wearing long sleeves, sun glasses (UVA+UVB protection) and wide brim hats (4-inch brim around the entire circumference of the hat) are also recommended for sun protection.  - Call for new or changing lesions.  LENTIGINES, SEBORRHEIC KERATOSES, HEMANGIOMAS - Benign normal skin lesions - Benign-appearing - Call for any changes - SK  - L upper eyelid  MELANOCYTIC NEVI -  Tan-brown and/or pink-flesh-colored symmetric macules and papules - L infraocular, L malar cheek flesh papules - Benign appearing on exam today - Observation - Call clinic for new or changing moles - Recommend daily use of broad spectrum spf 30+ sunscreen to sun-exposed areas.    Inflamed seborrheic keratosis (2) L lat mid back at braline x 1, L antecubitum x 1  Symptomatic, irritating, patient would like treated.   Destruction of lesion - L lat mid back at braline x 1, L antecubitum x 1 (2)  Destruction method: cryotherapy   Informed consent: discussed and consent obtained   Lesion destroyed using liquid nitrogen: Yes   Region frozen until ice ball extended beyond lesion: Yes   Outcome: patient tolerated procedure well with no complications   Post-procedure details: wound care instructions given   Additional details:  Prior to procedure, discussed risks of blister formation, small wound, skin dyspigmentation, or rare scar following cryotherapy. Recommend Vaseline ointment to treated areas while healing.    ONYCHOMYCOSIS Exam: Nail thickening with discoloration of 70% of nail on the L great toenail, med nail plate involvement on the R great toenail  Treatment Plan: Chronic fungal infection of nail Pt not interested in oral antifungal due to h/o fatty liver Start Kerydin solution QHS. Send to [employer] [facility] [employer].        INTERTRIGO Exam Pink patches, mild maceration on the lower abdominal fold and inframammary  Chronic and persistent condition with duration or expected duration over one year. Condition is bothersome/symptomatic for patient. Currently flared.   Intertrigo is a chronic recurrent rash that occurs in skin fold  areas that may be associated with friction; heat; moisture; yeast; fungus; and bacteria.  It is exacerbated by increased movement / activity; sweating; and higher atmospheric temperature.  Treatment Plan Start Ketoconazole 2% cream  BID PRN flares. Once improved decrease to QD during warmer weather as preventative.  Recommend OTC Zeasorb AF powder to body folds daily after shower.  It is often found in the athlete's foot section in the pharmacy.  Avoid using powders that contain cornstarch.  ACNE VULGARIS Exam: Open and closed comedones nose and paranasal area.  Chronic and persistent condition with duration or expected duration over one year. Condition is symptomatic/ bothersome to patient. Not currently at goal.  Treatment Plan: Recommend Differin 0.1% gel or La Roche Posay  Effaclar QHS.    DERMATOFIBROMA - L ant thigh Exam: Firm pink/brown papulenodule with dimple sign. Treatment Plan: A dermatofibroma is a benign growth possibly related to trauma, such as an insect bite, cut from shaving, or inflamed acne-type bump.  Treatment options to remove include shave or excision with resulting scar and risk of recurrence.  Since benign-appearing and not bothersome, will observe for now.   Return in about 1 year (around 02/19/2024) for TBSE.  Maylene Roes, CMA, am acting as scribe for Willeen Niece, MD .   Documentation: I have reviewed the above documentation for accuracy and completeness, and I agree with the above.  Willeen Niece, MD

## 2023-02-19 NOTE — Patient Instructions (Addendum)
Intertrigo is a chronic recurrent rash that occurs in skin fold areas that may be associated with friction; heat; moisture; yeast; fungus; and bacteria.  It is exacerbated by increased movement / activity; sweating; and higher atmospheric temperature.  Treatment Plan Start Ketoconazole 2% cream twice daily as needed for flares. Once improved decrease to once daily during warmer weather. Recommend OTC Zeasorb AF powder to body folds daily after shower.  It is often found in the athlete's foot section in the pharmacy.  Avoid using powders that contain cornstarch.  ACNE VULGARIS (black heads) Exam: Open comedones   Chronic and persistent condition with duration or expected duration over one year. Condition is symptomatic/ bothersome to patient. Not currently at goal.  Treatment Plan: Recommend Differin 0.1% gel or La Roche Posay  Effaclar a thin coat to the face every night as tolerated.   Due to recent changes in healthcare laws, you may see results of your pathology and/or laboratory studies on MyChart before the doctors have had a chance to review them. We understand that in some cases there may be results that are confusing or concerning to you. Please understand that not all results are received at the same time and often the doctors may need to interpret multiple results in order to provide you with the best plan of care or course of treatment. Therefore, we ask that you please give [place] 2 business days to thoroughly review all your results before contacting the office for clarification. Should we see a critical lab result, you will be contacted sooner.   If You Need Anything After Your Visit  If you have any questions or concerns for your doctor, please call our main line at [phone] and press option 4 to reach your doctor's medical assistant. If no one answers, please leave a voicemail as directed and we will return your call as soon as possible. Messages left after 4 pm will be answered the  following business day.   You may also send [place] a message via MyChart. We typically respond to MyChart messages within 1-2 business days.  For prescription refills, please ask your pharmacy to contact our office. Our fax number is [phone].  If you have an urgent issue when the clinic is closed that cannot wait until the next business day, you can page your doctor at the number below.    Please note that while we do our best to be available for urgent issues outside of office hours, we are not available 24/7.   If you have an urgent issue and are unable to reach [place], you may choose to seek medical care at your doctor's office, retail clinic, urgent care center, or emergency room.  If you have a medical emergency, please immediately call 911 or go to the emergency department.  Pager Numbers  - Dr. Gwen Pounds: [phone]  - Dr. Roseanne Reno: [phone]  In the event of inclement weather, please call our main line at [phone] for an update on the status of any delays or closures.  Dermatology Medication Tips: Please keep the boxes that topical medications come in in order to help keep track of the instructions about where and how to use these. Pharmacies typically print the medication instructions only on the boxes and not directly on the medication tubes.   If your medication is too expensive, please contact our office at [phone] option 4 or send [place] a message through MyChart.   We are unable to tell what your co-pay for medications will be  in advance as this is different depending on your insurance coverage. However, we may be able to find a substitute medication at lower cost or fill out paperwork to get insurance to cover a needed medication.   If a prior authorization is required to get your medication covered by your insurance company, please allow [place] 1-2 business days to complete this process.  Drug prices often vary depending on where the prescription is filled and some  pharmacies may offer cheaper prices.  The website www.goodrx.com contains coupons for medications through different pharmacies. The prices here do not account for what the cost may be with help from insurance (it may be cheaper with your insurance), but the website can give you the price if you did not use any insurance.  - You can print the associated coupon and take it with your prescription to the pharmacy.  - You may also stop by our office during regular business hours and pick up a GoodRx coupon card.  - If you need your prescription sent electronically to a different pharmacy, notify our office through [facility] or by phone at [phone] option 4.     Si Usted Necesita Algo Despus de Su Visita  Tambin puede enviarnos un mensaje a travs de Clinical cytogeneticist. Por lo general respondemos a los mensajes de MyChart en el transcurso de 1 a 2 das hbiles.  Para renovar recetas, por favor pida a su farmacia que se ponga en contacto con nuestra oficina. Annie Sable de fax es [place] [phone].  Si tiene un asunto urgente cuando la clnica est cerrada y que no puede esperar hasta el siguiente da hbil, puede llamar/localizar a su doctor(a) al nmero que aparece a continuacin.   Por favor, tenga en cuenta que aunque hacemos todo lo posible para estar disponibles para asuntos urgentes fuera del horario de [place], no estamos disponibles las 24 horas del da, los 7 [address]  [address] de la [place].   Si tiene un problema urgente y no puede comunicarse con nosotros, puede optar por buscar atencin mdica  en el consultorio de su doctor(a), en una clnica privada, en un centro de atencin urgente o en una sala de emergencias.  Si tiene Engineer, drilling, por favor llame inmediatamente al 911 o vaya a la sala de emergencias.  Nmeros de bper  - Dr. Gwen Pounds: [phone]  - Dra. Roseanne Reno: [phone]  En caso de inclemencias del [place], por favor llame a Lacy Duverney principal al [phone]  para una actualizacin sobre el [place] de cualquier retraso o cierre.  Consejos para la medicacin en dermatologa: Por favor, guarde las cajas en las que vienen los medicamentos de uso tpico para ayudarle a seguir las instrucciones sobre dnde y cmo usarlos. Las farmacias generalmente imprimen las instrucciones del medicamento slo en las cajas y no directamente en los tubos del [place].   Si su medicamento es muy caro, por favor, pngase en contacto con Rolm Gala llamando al [phone] y presione la opcin 4 o envenos un mensaje a travs de Clinical cytogeneticist.   No podemos decirle cul ser su copago por los medicamentos por adelantado ya que esto es diferente dependiendo de la cobertura de su seguro. Sin embargo, es posible que podamos encontrar un medicamento sustituto a Audiological scientist un formulario para que el seguro cubra el medicamento que se considera necesario.   Si se requiere una autorizacin previa para que su compaa de seguros [place] su medicamento, por favor permtanos de 1 a 2 das hbiles para completar  este proceso.  Los precios de los medicamentos varan con frecuencia dependiendo del Environmental consultant de dnde se surte la receta y alguna farmacias pueden ofrecer precios ms baratos.  El sitio web www.goodrx.com tiene cupones para medicamentos de Health and safety inspector. Los precios aqu no tienen en cuenta lo que podra costar con la ayuda del seguro (puede ser ms barato con su seguro), pero el sitio web puede darle el precio si no utiliz Tourist information centre manager.  - Puede imprimir el cupn correspondiente y llevarlo con su receta a la farmacia.  - Tambin puede pasar por nuestra oficina durante el horario de atencin regular y Education officer, museum una tarjeta de cupones de GoodRx.  - Si necesita que su receta se enve electrnicamente a una farmacia diferente, informe a nuestra oficina a travs de MyChart de [place] o por telfono llamando al [phone] y presione la opcin 4.

## 2023-02-20 ENCOUNTER — Other Ambulatory Visit: Payer: Self-pay

## 2023-02-20 MED ORDER — TAVABOROLE 5 % EX SOLN: CUTANEOUS | 11 refills | Status: AC

## 2023-02-20 NOTE — Progress Notes (Signed)
Patient called regarding Kyerdin not being available at local pharmacies. RX sent to [facility] to try for availability and copay. Patient will call me with any issues. aw

## 2023-03-13 ENCOUNTER — Other Ambulatory Visit: Payer: Self-pay | Admitting: Family Medicine

## 2023-03-13 DIAGNOSIS — Z1231 Encounter for screening mammogram for malignant neoplasm of breast: Secondary | ICD-10-CM

## 2023-03-16 ENCOUNTER — Encounter: Payer: Self-pay | Admitting: *Deleted

## 2023-03-20 ENCOUNTER — Ambulatory Visit: Payer: Medicare Other | Admitting: Anesthesiology

## 2023-03-20 ENCOUNTER — Encounter: Payer: Self-pay | Admitting: *Deleted

## 2023-03-20 ENCOUNTER — Ambulatory Visit
Admission: RE | Admit: 2023-03-20 | Discharge: 2023-03-20 | Disposition: A | Discharge status: Home / Self Care | Payer: Medicare Other | Attending: Gastroenterology | Admitting: Gastroenterology

## 2023-03-20 ENCOUNTER — Encounter
Admission: RE | Disposition: A | Discharge status: Home / Self Care | Payer: Self-pay | Source: Home / Self Care | Attending: Gastroenterology

## 2023-03-20 DIAGNOSIS — Z1211 Encounter for screening for malignant neoplasm of colon: Secondary | ICD-10-CM | POA: Diagnosis present

## 2023-03-20 DIAGNOSIS — Z86010 Personal history of colonic polyps: Secondary | ICD-10-CM | POA: Insufficient documentation

## 2023-03-20 DIAGNOSIS — M810 Age-related osteoporosis without current pathological fracture: Secondary | ICD-10-CM | POA: Insufficient documentation

## 2023-03-20 DIAGNOSIS — G47419 Narcolepsy without cataplexy: Secondary | ICD-10-CM | POA: Insufficient documentation

## 2023-03-20 DIAGNOSIS — I10 Essential (primary) hypertension: Secondary | ICD-10-CM | POA: Diagnosis not present

## 2023-03-20 DIAGNOSIS — K219 Gastro-esophageal reflux disease without esophagitis: Secondary | ICD-10-CM | POA: Diagnosis not present

## 2023-03-20 DIAGNOSIS — Z6841 Body Mass Index (BMI) 40.0 and over, adult: Secondary | ICD-10-CM | POA: Diagnosis not present

## 2023-03-20 DIAGNOSIS — M797 Fibromyalgia: Secondary | ICD-10-CM | POA: Insufficient documentation

## 2023-03-20 DIAGNOSIS — Z87891 Personal history of nicotine dependence: Secondary | ICD-10-CM | POA: Insufficient documentation

## 2023-03-20 DIAGNOSIS — E039 Hypothyroidism, unspecified: Secondary | ICD-10-CM | POA: Diagnosis not present

## 2023-03-20 DIAGNOSIS — K621 Rectal polyp: Secondary | ICD-10-CM | POA: Diagnosis not present

## 2023-03-20 HISTORY — PX: BIOPSY: SHX5522

## 2023-03-20 HISTORY — PX: FLEXIBLE SIGMOIDOSCOPY: SHX5431

## 2023-03-20 SURGERY — SIGMOIDOSCOPY, FLEXIBLE
Anesthesia: General

## 2023-03-20 MED ORDER — SODIUM CHLORIDE 0.9 % IV SOLN: INTRAVENOUS | Status: DC

## 2023-03-20 MED ORDER — PROPOFOL 10 MG/ML IV BOLUS
INTRAVENOUS | Status: DC | PRN
  Administered 2023-03-20: 25 mg via INTRAVENOUS
  Administered 2023-03-20: 75 mg via INTRAVENOUS
  Administered 2023-03-20: 100 ug/kg/min via INTRAVENOUS

## 2023-03-20 NOTE — Anesthesia Preprocedure Evaluation (Signed)
Anesthesia Evaluation  Patient identified by MRN, date of birth, ID band Patient awake    Reviewed: Allergy & Precautions, NPO status , Patient's Chart, lab work & pertinent test results  History of Anesthesia Complications Negative for: history of anesthetic complications  Airway Mallampati: III  TM Distance: <3 FB Neck ROM: full    Dental  (+) Chipped   Pulmonary neg shortness of breath, COPD, former smoker   Pulmonary exam normal        Cardiovascular Exercise Tolerance: Good hypertension, Normal cardiovascular exam     Neuro/Psych  Headaches PSYCHIATRIC DISORDERS       Neuromuscular disease    GI/Hepatic ,GERD  Controlled,,(+) Hepatitis -  Endo/Other  negative endocrine ROS    Renal/GU negative Renal ROS  negative genitourinary   Musculoskeletal   Abdominal   Peds  Hematology negative hematology ROS (+)   Anesthesia Other Findings Past Medical History: No date: Allergic rhinitis, cause unspecified No date: Allergy No date: Anemia No date: Arthritis     Comment:  C-spine; s/p prior to C-spine fusion. 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  ovarian resection R. No date: BACK SURGERY 2005: BREAST EXCISIONAL BIOPSY     Comment:   R   Benign No date: BREAST SURGERY No date: carpal tunnel release - bilateral No date: CESAREAN SECTION     Comment:  x2  1984 and 1986 No date: CHOLECYSTECTOMY 1997: coccyx disorder     Comment:  persistant sciatica 03/17/2012: COLONOSCOPY     Comment:  [facility] 12/08/2016: COLONOSCOPY WITH PROPOFOL; N/A     Comment:  Procedure: COLONOSCOPY WITH PROPOFOL;  Surgeon:               Christena Deem, MD;  Location: [facility] ENDOSCOPY;                Service: Endoscopy;  Laterality: N/A; 09/04/2022: COLONOSCOPY WITH PROPOFOL; N/A     Comment:  Procedure: COLONOSCOPY WITH PROPOFOL;  Surgeon:               Regis Bill, MD;  Location: [facility] ENDOSCOPY;                Service: Endoscopy;  Laterality: N/A; No date: DIAGNOSTIC LAPAROSCOPY No date: DILATION AND CURETTAGE OF UTERUS     Comment:  SAB 03/17/2012: ESOPHAGOGASTRODUODENOSCOPY     Comment:  no Barrett's esophagus. [facility]. 2007: fusion of cervical spine     Comment:  C5,C6,C7, and T1 No date: KNEE SURGERY 2009: laprascopy     Comment:  for RLQ pain:R  oophorectomy performed no improvement in               pain.  No date: PARATHYROIDECTOMY 1992: pilonidal cyst resection 06/24/2009: R shoulder surgery     Comment:  2 tears in rotator cuff,bone spurs. Dr. Collins [place] 2010: SHOULDER ARTHROSCOPY; Right 09/2011: SPINE SURGERY     Comment:  Cervical spine fusion No date: THYROIDECTOMY No date: TUBAL LIGATION 2002: tubel ligation 2009: VAGINAL HYSTERECTOMY     Comment:  fibroid/endometriosis, one L ovary remaining  BMI    Body Mass Index: 40.93 kg/m      Reproductive/Obstetrics negative OB ROS                              Anesthesia Physical Anesthesia Plan  ASA: 3  Anesthesia Plan: General   Post-op Pain Management:    Induction: Intravenous  PONV Risk Score and Plan: Propofol infusion and TIVA  Airway Management Planned: Natural Airway and Nasal Cannula  Additional Equipment:   Intra-op Plan:   Post-operative Plan:   Informed Consent: I have reviewed the patients History and Physical, chart, labs and discussed the procedure including the risks, benefits and alternatives for the proposed anesthesia with the patient or authorized representative who has indicated his/her understanding and acceptance.     Dental Advisory Given  Plan Discussed with: Anesthesiologist, CRNA and Surgeon  Anesthesia Plan Comments: (Patient consented for risks of anesthesia including but not limited to:  - adverse reactions to medications - risk of airway placement if required - damage to eyes, teeth, lips or other oral mucosa - nerve damage due to positioning  - sore throat or hoarseness - Damage to heart, brain, nerves, lungs, other parts of body or loss of life  Patient voiced understanding.)       Anesthesia Quick Evaluation

## 2023-03-20 NOTE — Op Note (Signed)
[facility] Patient Name: Emily Melton Procedure Date: 03/20/2023 2:51 PM MRN: [id number] Account #: [id number] Date of Birth: 10/27/1960 Admit Type: Outpatient Age: 62 Room: [facility] ENDO ROOM 1 Gender: Female Note Status: Finalized Instrument Name: Peds Colonoscope [id number] Procedure:             Flexible Sigmoidoscopy Indications:           High risk colon cancer surveillance: Personal history                         of sessile serrated colon polyp (less than 10 mm in                         size) with no dysplasia Providers:             Eather Colas MD, MD Referring MD:          Myrle Sheng. Katrinka Blazing, MD (Referring MD) Medicines:             Monitored Anesthesia Care Complications:         No immediate complications. Estimated blood loss:                         Minimal. Procedure:             Pre-Anesthesia Assessment:                        - Prior to the procedure, a History and Physical was                         performed, and patient medications and allergies were                         reviewed. The patient is competent. The risks and                         benefits of the procedure and the sedation options and                         risks were discussed with the patient. All questions                         were answered and informed consent was obtained.                         Patient identification and proposed procedure were                         verified by the physician, the nurse, the                         anesthesiologist, the anesthetist and the technician                         in the endoscopy suite. Mental Status Examination:                         alert and oriented. Airway Examination: normal  oropharyngeal airway and neck mobility. Respiratory                         Examination: clear to auscultation. CV Examination:                         normal. Prophylactic Antibiotics: The patient does not                          require prophylactic antibiotics. Prior                         Anticoagulants: The patient has taken no anticoagulant                         or antiplatelet agents. ASA Grade Assessment: III - A                         patient with severe systemic disease. After reviewing                         the risks and benefits, the patient was deemed in                         satisfactory condition to undergo the procedure. The                         anesthesia plan was to use monitored anesthesia care                         (MAC). Immediately prior to administration of                         medications, the patient was re-assessed for adequacy                         to receive sedatives. The heart rate, respiratory                         rate, oxygen saturations, blood pressure, adequacy of                         pulmonary ventilation, and response to care were                         monitored throughout the procedure. The physical                         status of the patient was re-assessed after the                         procedure.                        After obtaining informed consent, the scope was passed                         under direct vision. The Colonoscope was introduced  through the anus and advanced to the the rectosigmoid                         junction. The flexible sigmoidoscopy was accomplished                         without difficulty. The patient tolerated the                         procedure well. The quality of the bowel preparation                         was adequate to identify polyps. Findings:      The perianal and digital rectal examinations were normal.      Six hyperplastic polyps were found in the rectum. The polyps were small       in size. These polyps were removed with a jumbo cold forceps. There were       likely more but these were the most prominent. Resection and retrieval       were complete.  Estimated blood loss was minimal. Impression:            - Six small polyps in the rectum, removed with a jumbo                         cold forceps. Resected and retrieved. Recommendation:        - Discharge patient to home.                        - Resume previous diet.                        - Continue present medications.                        - Await pathology results.                        - Return to referring physician as previously                         scheduled. Procedure Code(s):     --- Professional ---                        [phone], 52, Sigmoidoscopy, flexible; with biopsy,                         single or multiple Diagnosis Code(s):     --- Professional ---                        Z86.010, Personal history of colonic polyps                        D12.8, Benign neoplasm of rectum CPT copyright 2022 American [employer] Association. All rights reserved. The codes documented in this report are preliminary and upon coder review may  be revised to meet current compliance requirements. Eather Colas MD, MD 03/20/2023 3:13:08 PM Number of Addenda: 0 Note Initiated On: 03/20/2023 2:51 PM Total Procedure Duration: 0 hours 6 minutes 21 seconds  Estimated Blood Loss:  Estimated blood loss was minimal.      [facility]

## 2023-03-20 NOTE — H&P (Signed)
Outpatient short stay form Pre-procedure 03/20/2023  Emily Bill, MD  Primary Physician: Ethelda Chick, MD  Reason for visit:  History of polyp  History of present illness:    62 y/o lady with history of morbid obesity, hypothyroidism, and hypertension here for colonoscopy due to history of SSA in rectosigmoid junction. No blood thinners. No first degree relatives with colon cancer. History of cholecystectomy, oopherectomy, and hysterectomy.     Current Facility-Administered Medications:    0.9 %  sodium chloride infusion, , Intravenous, Continuous, Annelies Coyt, Rossie Muskrat, MD, Last Rate: 20 mL/hr at 03/20/23 1405, New Bag at 03/20/23 1405  Medications Prior to Admission  Medication Sig Dispense Refill Last Dose   amLODipine (NORVASC) 5 MG tablet Take 5 mg by mouth daily.   03/19/2023   cetirizine (ZYRTEC) 10 MG tablet Take 10 mg by mouth at bedtime.    03/19/2023   Cholecalciferol (D3-1000 PO)    03/19/2023   lovastatin (MEVACOR) 20 MG tablet Take 40 mg by mouth daily.   03/19/2023   montelukast (SINGULAIR) 10 MG tablet Take 1 tablet (10 mg total) by mouth at bedtime. 90 tablet 3 03/19/2023   pantoprazole (PROTONIX) 20 MG tablet Take 20 mg by mouth daily.   03/19/2023   propranolol (INDERAL) 20 MG tablet Take 3 tablets (60 mg total) by mouth 2 (two) times daily. (Patient taking differently: Take 40 mg by mouth 2 (two) times daily.) 540 tablet 3 03/20/2023 at 0500   albuterol (VENTOLIN HFA) 108 (90 Base) MCG/ACT inhaler INHALE 2 PUFFS INTO THE LUNGS EVERY 4 HOURS AS NEEDED 8.5 g 12    azithromycin (ZITHROMAX) 250 MG tablet Take 2 tablets today and then 1 tablet daily until gone. 6 tablet 0    dextroamphetamine (DEXEDRINE) 10 MG 24 hr capsule 1 or 2 by mouth daily as needed 60 capsule 0    dicyclomine (BENTYL) 10 MG capsule Take 1 capsule (10 mg total) by mouth 3 (three) times daily as needed for up to 14 days for spasms. 42 capsule 0    ketoconazole (NIZORAL) 2 % cream Apply to aa's abdomen and  under the breast BID PRN. 60 g 5    levothyroxine (SYNTHROID, LEVOTHROID) 50 MCG tablet Take 75 mcg by mouth daily before breakfast.       olmesartan-hydrochlorothiazide (BENICAR HCT) 40-12.5 MG tablet Take 1 tablet by mouth daily.      Tavaborole 5 % SOLN Apply to aa's nails QHS. 4 mL 11    tiZANidine (ZANAFLEX) 4 MG tablet Take 1 tablet (4 mg total) by mouth every 6 (six) hours as needed for muscle spasms. 60 tablet 2      Allergies  Allergen Reactions   Duloxetine Hcl Other (See Comments)    Hypertension, mood changes   Mirtazapine Swelling   Serotonin Reuptake Inhibitors (Ssris) Other (See Comments)    Hypertension, mood changes   St Johns Wort Anaphylaxis   Bupropion Hcl Other (See Comments)    Mental changes and high blood pressure    Celebrex [Celecoxib] Hypertension   Effexor [Venlafaxine] Other (See Comments)    Moodiness, vivid dreams, poor sleep cycle.   Gralise [Gabapentin (Once-Daily)] Cough   Levothyroxine Sodium Other (See Comments)    Pt reports unable to take 75 mg - heartburn   Lyrica [Pregabalin] Other (See Comments) and Hypertension    Mood changes, eye twitching and UTI   Methylphenidate Hcl Other (See Comments)    unknown   Mirapex [Pramipexole]  HTN, hallucinations and nausea   Modafinil Swelling and Hypertension   Nexium [Esomeprazole]     GI side effects   Rabeprazole Sodium Swelling and Other (See Comments)    Headaches    Savella [Milnacipran Hcl] Other (See Comments)   Zegerid [Omeprazole] Other (See Comments)   Lisinopril Rash    Hypertension     Past Medical History:  Diagnosis Date   Allergic rhinitis, cause unspecified    Allergy    Anemia    Arthritis    C-spine; 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    Barrett's esophagus    Cervical radiculopathy    Chicken pox    childhood   Depression    Diaphragmatic hernia without mention of obstruction or gangrene    Fibromyalgia    Fibromyalgia    GERD  (gastroesophageal reflux disease)    HTN (hypertension)    Insomnia, unspecified    Migraine, unspecified, without mention of intractable migraine without mention of status migrainosus    Narcolepsy    w/o cataplexy   Nonspecific abnormal electrocardiogram (ECG) (EKG)    Osteoporosis    Other abnormal glucose    Periodic limb movement disorder    Personal history of colonic polyps    Postlaminectomy syndrome of cervical region    Tobacco use disorder    Unspecified vitamin D deficiency     Review of systems:  Otherwise negative.    Physical Exam  Gen: Alert, oriented. Appears stated age.  HEENT: PERRLA. Lungs: No respiratory distress CV: RRR Abd: soft, benign, no masses Ext: No edema    Planned procedures: Proceed with flex sig. The patient understands the nature of the planned procedure, indications, risks, alternatives and potential complications including but not limited to bleeding, infection, perforation, damage to internal organs and possible oversedation/side effects from anesthesia. The patient agrees and gives consent to proceed.  Please refer to procedure notes for findings, recommendations and patient disposition/instructions.     Emily Bill, MD [facility]

## 2023-03-20 NOTE — Transfer of Care (Signed)
Immediate Anesthesia Transfer of Care Note  Patient: Emily Melton  Procedure(s) Performed: FLEXIBLE SIGMOIDOSCOPY  Patient Location: PACU  Anesthesia Type:MAC  Level of Consciousness: awake  Airway & Oxygen Therapy: Patient Spontanous Breathing  Post-op Assessment: Report given to RN and Post -op Vital signs reviewed and stable  Post vital signs: Reviewed and stable  Last Vitals:  Vitals Value Taken Time  BP 125/47 03/20/23 1512  Temp 35.6 C 03/20/23 1512  Pulse 71 03/20/23 1512  Resp 12 03/20/23 1512  SpO2 97 % 03/20/23 1512    Last Pain:  Vitals:   03/20/23 1512  TempSrc: Temporal  PainSc: 0-No pain         Complications: No notable events documented.

## 2023-03-20 NOTE — Interval H&P Note (Signed)
History and Physical Interval Note:  03/20/2023 2:53 PM  Emily Melton  has presented today for surgery, with the diagnosis of D12.5 (ICD-10-CM) - Adenomatous polyp of sigmoid colon.  The various methods of treatment have been discussed with the patient and family. After consideration of risks, benefits and other options for treatment, the patient has consented to  Procedure(s): FLEXIBLE SIGMOIDOSCOPY (N/A) as a surgical intervention.  The patient's history has been reviewed, patient examined, no change in status, stable for surgery.  I have reviewed the patient's chart and labs.  Questions were answered to the patient's satisfaction.     Regis Bill  Ok to proceed with flex sig

## 2023-03-20 NOTE — Anesthesia Postprocedure Evaluation (Signed)
Anesthesia Post Note  Patient: Emily Melton  Procedure(s) Performed: FLEXIBLE SIGMOIDOSCOPY  Patient location during evaluation: PACU Anesthesia Type: General Level of consciousness: awake and alert Pain management: pain level controlled Vital Signs Assessment: post-procedure vital signs reviewed and stable Respiratory status: spontaneous breathing, nonlabored ventilation, respiratory function stable and patient connected to nasal cannula oxygen Cardiovascular status: blood pressure returned to baseline and stable Postop Assessment: no apparent nausea or vomiting Anesthetic complications: no   No notable events documented.   Last Vitals:  Vitals:   03/20/23 1522 03/20/23 1532  BP: (!) 150/74   Pulse: (!) 55 65  Resp: (!) 25   Temp:    SpO2: 98% 97%    Last Pain:  Vitals:   03/20/23 1522  TempSrc:   PainSc: 0-No pain                 Yevette Edwards

## 2023-03-21 ENCOUNTER — Encounter: Payer: Self-pay | Admitting: Gastroenterology

## 2023-04-02 ENCOUNTER — Ambulatory Visit: Payer: Medicare Other | Admitting: Internal Medicine

## 2023-04-07 NOTE — Progress Notes (Unsigned)
HPI  female former smoker followed for nocturnal hypoxia/minimal OSA, history Narcolepsy Sleep Study 2016 - Dr Frances Furbish. History of Narcolepsy - diagnosed 2001- tried and failed Ritalin and Provigil. NPSG  11/30/14- [facility] AHI 5.3/ hr, desat to 83%, weight 216 lbs Study in 2001 in [place], [place], with report no longer available, included MSLT and she says diagnosed narcolepsy without sleep apnea. The 2016 sleep study was supposed to have included an MSLT but that didn't get done and she didn't like her experience there. Takes Zanaflex for cervical radiculopathy pain and it helps her sleep .Previous trials of Provigil and Ritalin both said to cause HBP and "swelling". She ended up on Dexedrine for a while. Tried Mirapex for vaguely defined limb jerks. This caused a lot of malaise and nausea. NPSG 10/19/2015-AHI 2.4 per hour/WNL, desaturation to 81%, moderate snoring, severe PLMS with arousal 6.3/hour MSLT- 10/20/2015-mean latency 3 minutes 43 seconds, 0/5 SOREM    C/W idiopathic hypersomnia History of recurrent bronchitis and chronic cough until she quit smoking. Several previous pneumonias .PFT 12/08/2015-moderate obstructive airways disease, insignificant response to bronchodilator, normal total lung capacity. They could not get an accurate diffusion capacity ONOX on room air 08/23/22- 7 hrd 23 min </= 88% -------------------------------------------------------------------------------------------------- 09/18/27-   Covid vax- 5 Phizer Flu vax had Body weight 61 year old female former smoker( 36 pk yr) followed for COPD, Nocturnal Hypoxia/ minimal OSA,  Narcolepsy, Insomnia, complicated by GERD/Barrett's, Hx parathyroidectomy/partial thyroidectomy/ Obesity, HTN,  (Remotely ritalin helped, but may have had allergic reaction.Didn't like Provigil. No cataplexy.) Now questions if adderall gives headache.Tried Vyvanse 10 mg qAM-  -Ventolin hfa, Singulair, Dexedrine 10 mg      note  Inderal Does not have CPAP today-245 lbs ONOX on room air 08/23/22- 7 hrd 23 min </= 88% O2 2L Sleep/ [facility]  Screening CT due July Rarely needs rescue inhaler-maybe once every 2 weeks. Continues to feel Dexedrine works better than other stimulants tried for managing daytime sleepiness.  Together with occasional naps and attention to nighttime sleep habits, she feels stable.  04/09/23- 62 year old female former smoker( 36 pk yr) followed for COPD, Nocturnal Hypoxia/ minimal OSA, Narcolepsy, Insomnia, complicated by GERD/Barrett's, Hx parathyroidectomy/partial thyroidectomy/ Obesity, HTN, CAD,  (Remotely ritalin helped, but may have had allergic reaction.Didn't like Provigil. No cataplexy.) Now questions if adderall gives headache.Tried Vyvanse 10 mg qAM-  -Ventolin hfa, Singulair, Dexedrine 10 mg      note Inderal Does not have CPAP  -----Breathing has been good    CT chest 01/19/23- MPRESSION: 1. Lung-RADS 2S, benign appearance or behavior. Continue annual screening with low-dose chest CT without contrast in 12 months. 2. The "S" modifier above refers to potentially clinically significant non lung cancer related findings. Specifically, there is aortic atherosclerosis, in addition to right coronary artery disease. Please note that although the presence of coronary artery calcium documents the presence of coronary artery disease, the severity of this disease and any potential stenosis cannot be assessed on this non-gated CT examination. Assessment for potential risk factor modification, dietary therapy or pharmacologic therapy may be warranted, if clinically indicated. 3. Mild diffuse bronchial wall thickening with very mild centrilobular and paraseptal emphysema; imaging findings suggestive of underlying COPD.   Aortic Atherosclerosis (ICD10-I70.0) and Emphysema (ICD10-J43.9).  ROS-see HPI  + = positive Constitutional:    weight loss, night sweats, fevers, chills, + fatigue,  lassitude. HEENT:    headaches, difficulty swallowing, tooth/dental problems, sore throat,       sneezing, itching, ear ache, nasal congestion,  post nasal drip, snoring CV:    chest pain, orthopnea, PND, swelling in lower extremities, anasarca,                                                     dizziness, palpitations Resp:   shortness of breath with exertion or at rest.                +roductive cough,   non-productive cough, coughing up of blood.              change in color of mucus.  wheezing.   Skin:    rash or lesions. GI:  No-   heartburn, indigestion, abdominal pain, nausea, vomiting, diarrhea,                 change in bowel habits, loss of appetite GU: dysuria, change in color of urine, no urgency or frequency.   flank pain. MS:   joint pain, stiffness, decreased range of motion, + back pain. Neuro-     nothing unusual Psych:  change in mood or affect.  depression or anxiety.   memory loss.  OBJ- Physical Exam General- Alert, Oriented, Affect-appropriate, Distress- none acute, + obese Skin- rash-none, lesions- none, excoriation- none Lymphadenopathy- none Head- atraumatic            Eyes- Gross vision intact, PERRLA, conjunctivae and secretions clear            Ears- Hearing, canals-normal            Nose- Clear, no-Septal dev, mucus, polyps, erosion, perforation             Throat- Mallampati III , mucosa clear , drainage- none, tonsils- atrophic Neck- flexible , trachea midline, no stridor , thyroid nl, carotid no bruit Chest - symmetrical excursion , unlabored           Heart/CV- RRR , no murmur , no gallop  , no rub, nl s1 s2                           - JVD- none , edema- none, stasis changes- none, varices- none           Lung- clear to P&A, wheeze- none, cough- none , dullness-none, rub- none           Chest wall-  Abd-  Br/ Gen/ Rectal- Not done, not indicated Extrem- cyanosis- none, clubbing, none, atrophy- none, strength- nl Neuro- grossly intact to  observation

## 2023-04-09 ENCOUNTER — Encounter: Payer: Self-pay | Admitting: Internal Medicine

## 2023-04-09 ENCOUNTER — Ambulatory Visit: Payer: Medicare Other | Admitting: Internal Medicine

## 2023-04-09 VITALS — BP 142/76 | HR 47 | Temp 97.2°F | Ht 62.0 in | Wt 212.2 lb

## 2023-04-09 DIAGNOSIS — G4734 Idiopathic sleep related nonobstructive alveolar hypoventilation: Secondary | ICD-10-CM | POA: Diagnosis not present

## 2023-04-09 DIAGNOSIS — Z23 Encounter for immunization: Secondary | ICD-10-CM

## 2023-04-09 DIAGNOSIS — G47419 Narcolepsy without cataplexy: Secondary | ICD-10-CM

## 2023-04-09 NOTE — Patient Instructions (Signed)
Glad you are doing well  Order- flu vax standard  Let us know when you need refills

## 2023-04-12 ENCOUNTER — Encounter: Payer: Self-pay | Admitting: Internal Medicine

## 2023-04-12 NOTE — Assessment & Plan Note (Signed)
Benefit from O2 2L for sleep and will continue

## 2023-04-12 NOTE — Assessment & Plan Note (Signed)
Dexedrine and appropriate naps seem to be meeting her needs.

## 2023-04-23 ENCOUNTER — Ambulatory Visit
Admission: RE | Admit: 2023-04-23 | Discharge: 2023-04-23 | Disposition: A | Discharge status: Home / Self Care | Payer: Medicare Other | Source: Ambulatory Visit | Attending: Family Medicine | Admitting: Family Medicine

## 2023-04-23 DIAGNOSIS — Z1231 Encounter for screening mammogram for malignant neoplasm of breast: Secondary | ICD-10-CM | POA: Insufficient documentation

## 2023-06-18 ENCOUNTER — Other Ambulatory Visit: Payer: Self-pay | Admitting: Gastroenterology

## 2023-06-18 DIAGNOSIS — K760 Fatty (change of) liver, not elsewhere classified: Secondary | ICD-10-CM

## 2023-06-21 ENCOUNTER — Ambulatory Visit
Admission: RE | Admit: 2023-06-21 | Discharge: 2023-06-21 | Disposition: A | Discharge status: Home / Self Care | Payer: Medicare Other | Source: Ambulatory Visit | Attending: Gastroenterology | Admitting: Gastroenterology

## 2023-06-21 DIAGNOSIS — K760 Fatty (change of) liver, not elsewhere classified: Secondary | ICD-10-CM | POA: Diagnosis present

## 2023-08-21 ENCOUNTER — Emergency Department
Admission: EM | Admit: 2023-08-21 | Discharge: 2023-08-21 | Discharge status: Against Medical Advice | Payer: Medicare Other

## 2023-08-21 ENCOUNTER — Ambulatory Visit
Admission: EM | Admit: 2023-08-21 | Discharge: 2023-08-21 | Disposition: A | Discharge status: Home / Self Care | Payer: Medicare Other | Attending: Emergency Medicine | Admitting: Emergency Medicine

## 2023-08-21 DIAGNOSIS — R197 Diarrhea, unspecified: Secondary | ICD-10-CM

## 2023-08-21 DIAGNOSIS — R1084 Generalized abdominal pain: Secondary | ICD-10-CM | POA: Diagnosis not present

## 2023-08-21 DIAGNOSIS — R112 Nausea with vomiting, unspecified: Secondary | ICD-10-CM | POA: Diagnosis not present

## 2023-08-21 MED ORDER — ONDANSETRON 8 MG PO TBDP
8.0000 mg | ORAL_TABLET | Freq: Once | ORAL | Status: AC
  Administered 2023-08-21: 8 mg via ORAL

## 2023-08-21 NOTE — ED Triage Notes (Signed)
Pt c/o abd pain & N/V/D since this AM. Had 5 episodes of emesis today. Able to eat & drink.

## 2023-08-21 NOTE — ED Notes (Signed)
 Patient is being discharged from the Urgent Care and sent to the Emergency Department via EMS . Per Dr. Van, patient is in need of higher level of care due to severe abdominal pain. Patient is aware and verbalizes understanding of plan of care.  Vitals:   08/21/23 1718  BP: (!) 198/92  Pulse: 63  Resp: 16  Temp: 98.1 F (36.7 C)  SpO2: 95%

## 2023-08-21 NOTE — ED Notes (Signed)
 Patient is being discharged from the Urgent Care and sent to the Emergency Department via EMS . Per Dr.Mortenson, patient is in need of higher level of care due to possible bowel obstruction. Patient is aware and verbalizes understanding of plan of care.  Vitals:   08/21/23 1718  BP: (!) 198/92  Pulse: 63  Resp: 16  Temp: 98.1 F (36.7 C)  SpO2: 95%

## 2023-08-21 NOTE — ED Provider Notes (Signed)
 HPI  SUBJECTIVE:  Emily Melton is a 63 y.o. female who presents with the acute onset of 10 out of 10 left-sided abdominal pain starting at 0420 today. she states that she felt something drop in my colon followed by sharp, severe, constant pain.  She states it is now radiating to her umbilicus and along the left flank to her back.  She reports abdominal distention, nausea, 8 episodes of bilious emesis and diarrhea every time she vomits.  She reports dysuria, anorexia.  She states that the car ride over here was painful.  No other urinary complaints.  No melena, hematochezia.  Her last meal was at 1430 today, which she was able to keep down.  She had a sip of water while here prior to my evaluation.  She tried stooling, muscle relaxant, and dicyclomine  without improvement in her symptoms.  Symptoms are worse with sitting, lying down, movement, walking.  It is not associated with vomiting, p.o. intake, urination or defecation.  She has a past medical history of GI bleeding, diverticulosis, hypertension.  She vomited her medications up today.  She has a history of nonobstructive nephrolithiasis, fibromyalgia, GERD, narcolepsy, status post cholecystectomy, diagnostic laparoscopy for right lower quadrant pain, post right oophorectomy, vaginal hysterectomy.  No history of UTI, pyelonephritis, atrial fibrillation, mesenteric ischemia, small bowel obstruction, pancreatitis, hypercoagulability.  PCP: [facility].  Past Medical History:  Diagnosis Date   Allergic rhinitis, cause unspecified    Allergy    Anemia    Arthritis    C-spine; 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    Barrett's esophagus    Cervical radiculopathy    Chicken pox    childhood   Depression    Diaphragmatic hernia without mention of obstruction or gangrene    Fibromyalgia    Fibromyalgia    GERD (gastroesophageal reflux disease)    HTN (hypertension)    Insomnia, unspecified     Migraine, unspecified, without mention of intractable migraine without mention of status migrainosus    Narcolepsy    w/o cataplexy   Nonspecific abnormal electrocardiogram (ECG) (EKG)    Osteoporosis    Other abnormal glucose    Periodic limb movement disorder    Personal history of colonic polyps    Postlaminectomy syndrome of cervical region    Tobacco use disorder    Unspecified vitamin D  deficiency     Past Surgical History:  Procedure Laterality Date   ABDOMINAL HYSTERECTOMY     fibroids and endometriosis.  ovarian resection R.   BACK SURGERY     BIOPSY  03/20/2023   Procedure: BIOPSY;  Surgeon: Maryruth Ole DASEN, MD;  Location[facility] ENDOSCOPY;  Service: Endoscopy;;   BREAST EXCISIONAL BIOPSY  2005    R   Benign   BREAST SURGERY     carpal tunnel release - bilateral     CESAREAN SECTION     x2  1984 and 1986   CHOLECYSTECTOMY     coccyx disorder  1997   persistant sciatica   COLONOSCOPY  03/17/2012   [facility]   COLONOSCOPY WITH PROPOFOL  N/A 12/08/2016   Procedure: COLONOSCOPY WITH PROPOFOL ;  Surgeon: Gaylyn Gladis PENNER, MD;  Location[facility] END[facility]COPY;  Service: Endoscopy;  Laterality: N/A;   COLONOSCOPY WITH PROPOFOL  N/A 09/04/2022   Procedure: COLONOSCOPY WITH PROPOFOL ;  Surgeon: Maryruth Ole DASEN, MD;  Location: [facility] ENDOSCOPY;  Service: Endoscopy;  Laterality: N/A;   DIAGNOSTIC LAPAROSCOPY     DILATION AND  CURETTAGE OF UTERUS     SAB   ESOPHAGOGASTRODUODENOSCOPY  03/17/2012   no Barrett's esophagus. [facility].   FLEXIBLE SIGMOIDOSCOPY N/A 03/20/2023   Procedure: FLEXIBLE SIGMOIDOSCOPY;  Surgeon: Maryruth Ole DASEN, MD;  Location: [facility] ENDOSCOPY;  Service: Endoscopy;  Laterality: N/A;   fusion of cervical spine  2007   C5,C6,C7, and T1   KNEE SURGERY     laprascopy  2009   for RLQ pain:R oophorectomy performed no improvement in pain.    PARATHYROIDECTOMY     pilonidal cyst resection  1992   R shoulder surgery  06/24/2009   2 tears in rotator cuff,bone  spurs. Dr. pattricia   SHOULDER ARTHROSCOPY Right 2010   SPINE SURGERY  09/2011   Cervical spine fusion   THYROIDECTOMY     TUBAL LIGATION     tubel ligation  2002   VAGINAL HYSTERECTOMY  2009   fibroid/endometriosis, one L ovary remaining    Family History  Problem Relation Age of Onset   Bipolar disorder Mother    Cancer Mother        bladder   Hypertension Mother    Colon polyps Mother    Osteoarthritis Mother    Diabetes Mother    Thyroid disease Mother    Heart disease Father 103       CAD/AMI with stenting/PAD.   Hyperlipidemia Father    Cancer Father        skin   Angina Father    Asthma Daughter    Cancer Maternal Grandmother        colon   Heart disease Maternal Grandmother    Cancer Maternal Grandfather        lung   Parkinson's disease Maternal Grandfather    Stroke Maternal Grandfather    Diabetes Maternal Grandfather    Hyperlipidemia Maternal Grandfather    Rheum arthritis Paternal Grandmother    Heart attack Paternal Grandfather    Pulmonary fibrosis Paternal Aunt    Pulmonary fibrosis Paternal Uncle     Social History   Tobacco Use   Smoking status: Former    Current packs/day: 0.00    Average packs/day: 1 pack/day for 36.0 years (36.0 ttl pk-yrs)    Types: Cigarettes    Start date: 11/28/1977    Quit date: 11/28/2013    Years since quitting: 9.7   Smokeless tobacco: Former    Quit date: 11/19/2013   Tobacco comments:    smoked 1 ppd 30-35 years; quit in 2011  Vaping Use   Vaping status: Never Used  Substance Use Topics   Alcohol use: No    Alcohol/week: 0.0 standard drinks of alcohol    Comment: RARELY   Drug use: No     Current Facility-Administered Medications:    ondansetron  (ZOFRAN -ODT) disintegrating tablet 8 mg, 8 mg, Oral, Once, Van Knee, MD  Current Outpatient Medications:    nystatin cream (MYCOSTATIN), Apply topically 3 (three) times daily, Disp: , Rfl:    albuterol  (VENTOLIN  HFA) 108 (90 Base) MCG/ACT inhaler, INHALE  2 PUFFS INTO THE LUNGS EVERY 4 HOURS AS NEEDED, Disp: 8.5 g, Rfl: 12   amLODipine  (NORVASC ) 5 MG tablet, Take 5 mg by mouth daily., Disp: , Rfl:    azithromycin  (ZITHROMAX ) 250 MG tablet, Take 2 tablets today and then 1 tablet daily until gone., Disp: 6 tablet, Rfl: 0   cetirizine (ZYRTEC) 10 MG tablet, Take 10 mg by mouth at bedtime. , Disp: , Rfl:    Cholecalciferol (D3-1000 PO), , Disp: ,  Rfl:    Cholecalciferol (VITAMIN D -1000 MAX ST) 25 MCG (1000 UT) tablet, 2,000 Units once daily, Disp: , Rfl:    dextroamphetamine  (DEXEDRINE ) 10 MG 24 hr capsule, 1 or 2 by mouth daily as needed, Disp: 60 capsule, Rfl: 0   dicyclomine  (BENTYL ) 10 MG capsule, Take 1 capsule (10 mg total) by mouth 3 (three) times daily as needed for up to 14 days for spasms., Disp: 42 capsule, Rfl: 0   ketoconazole  (NIZORAL ) 2 % cream, Apply to aa's abdomen and under the breast BID PRN., Disp: 60 g, Rfl: 5   levothyroxine  (SYNTHROID , LEVOTHROID) 50 MCG tablet, Take 75 mcg by mouth daily before breakfast. , Disp: , Rfl:    lovastatin (MEVACOR) 20 MG tablet, Take 40 mg by mouth daily., Disp: , Rfl:    montelukast  (SINGULAIR ) 10 MG tablet, Take 1 tablet (10 mg total) by mouth at bedtime., Disp: 90 tablet, Rfl: 3   olmesartan-hydrochlorothiazide  (BENICAR HCT) 40-12.5 MG tablet, Take 1 tablet by mouth daily., Disp: , Rfl:    pantoprazole  (PROTONIX ) 20 MG tablet, Take 20 mg by mouth daily., Disp: , Rfl:    propranolol  (INDERAL ) 20 MG tablet, Take 3 tablets (60 mg total) by mouth 2 (two) times daily. (Patient taking differently: Take 40 mg by mouth 2 (two) times daily.), Disp: 540 tablet, Rfl: 3   Tavaborole  5 % SOLN, Apply to aa's nails QHS. (Patient not taking: Reported on 04/09/2023), Disp: 4 mL, Rfl: 11   tiZANidine  (ZANAFLEX ) 4 MG tablet, Take 1 tablet (4 mg total) by mouth every 6 (six) hours as needed for muscle spasms., Disp: 60 tablet, Rfl: 2  Allergies  Allergen Reactions   Alpelisib Other (See Comments)   Bupropion Other  (See Comments)   Duloxetine Hcl Other (See Comments)    Hypertension, mood changes   Mirtazapine Swelling   Serotonin Reuptake Inhibitors (Ssris) Other (See Comments)    Hypertension, mood changes   St John's Wort (Hypericum Perforatum) Anaphylaxis and Other (See Comments)   St Johns Wort Anaphylaxis   Gabapentin Cough   Bupropion Hcl Other (See Comments)    Mental changes and high blood pressure    Celebrex [Celecoxib] Hypertension   Effexor  [Venlafaxine ] Other (See Comments)    Moodiness, vivid dreams, poor sleep cycle.   Gralise [Gabapentin (Once-Daily)] Cough   Levothyroxine  Sodium Other (See Comments)    Pt reports unable to take 75 mg - heartburn   Lyrica [Pregabalin] Other (See Comments) and Hypertension    Mood changes, eye twitching and UTI   Methylphenidate Hcl Other (See Comments)    unknown   Mirapex  [Pramipexole ]     HTN, hallucinations and nausea   Modafinil Swelling and Hypertension   Nexium [Esomeprazole]     GI side effects   Rabeprazole Sodium Swelling and Other (See Comments)    Headaches    Savella [Milnacipran Hcl] Other (See Comments)   Zegerid [Omeprazole] Other (See Comments)   Lisinopril Rash    Hypertension     ROS  As noted in HPI.   Physical Exam  BP (!) 198/92 (BP Location: Left Arm)   Pulse 63   Temp 98.1 F (36.7 C) (Oral)   Resp 16   Ht 5' 2 (1.575 m)   Wt 96.6 kg   SpO2 95%   BMI 38.96 kg/m   Constitutional: Well developed, well nourished, in moderate amount of pain.  Cannot find a comfortable position. Eyes: PERRL, EOMI, conjunctiva normal bilaterally HENT: Normocephalic, atraumatic,mucus membranes moist Respiratory:  Clear to auscultation bilaterally, no rales, no wheezing, no rhonchi Cardiovascular: Normal rate and rhythm, no murmurs, no gallops, no rubs GI: Distended, hypoactive bowel sounds, diffuse tenderness, maximal in in the left flank region.  Positive rebound.  Positive voluntary guarding.  Positive tap table test.   Negative Murphy. Back: Left CVAT skin: No rash, skin intact Musculoskeletal: No edema, no tenderness, no deformities Neurologic: Alert & oriented x 3, CN III-XII grossly intact, no motor deficits, sensation grossly intact Psychiatric: Speech and behavior appropriate   ED Course   Medications  ondansetron  (ZOFRAN -ODT) disintegrating tablet 8 mg (has no administration in time range)    Orders Placed This Encounter  Procedures   Insert peripheral IV    Standing Status:   Standing    Number of Occurrences:   1   No results found for this or any previous visit (from the past 24 hours). No results found.  ED Clinical Impression  1. Generalized abdominal pain   2. Nausea vomiting and diarrhea      ED Assessment/Plan     Patient presents with 10 out of 10 left-sided abdominal pain that started this morning.  She has peritoneal signs and is distended.  Differential includes obstruction, perforation.  She also reports dysuria and has some CVAT.  Differential includes nonobstructing nephrolithiasis, obstructing infected nephrolithiasis.  She has no history of atrial fibrillation, mesenteric ischemia or hypercoagulability, thus mesenteric ischemia is lower, but still in,  the differential.  Doubt aortic dissection, hepatitis, pancreatitis.   Placing IV, giving Zofran  8 mg ODT.  Considered Toradol, but given her history of GI bleed, withholding it.  Transferring to the emergency department via EMS pain control, comprehensive evaluation.  Patient appears unsafe to drive.  Gave report to EMS and Lonell, [facility] charge nurse..    Meds ordered this encounter  Medications   ondansetron  (ZOFRAN -ODT) disintegrating tablet 8 mg      *This clinic note was created using Scientist, clinical (histocompatibility and immunogenetics). Therefore, there may be occasional mistakes despite careful proofreading. ?    Van Knee, MD 08/21/23 [phone]

## 2023-08-21 NOTE — ED Triage Notes (Signed)
Pt comes via EMS from [facility] with possible obstruction. Pt BP 215-95, pt did take meds and then vomited. Pt sent here for possible bowel obstruction

## 2023-11-05 ENCOUNTER — Other Ambulatory Visit: Payer: Self-pay | Admitting: Internal Medicine

## 2023-11-05 MED ORDER — ALBUTEROL SULFATE HFA 108 (90 BASE) MCG/ACT IN AERS: 1.0000 | INHALATION_SPRAY | RESPIRATORY_TRACT | 12 refills | Status: DC | PRN

## 2023-11-05 NOTE — Telephone Encounter (Signed)
 Copied from CRM [id number]. Topic: Clinical - Medication Refill >> Nov 05, 2023  3:36 PM Hilton Lucky wrote: Most Recent Primary Care Visit:   Medication: albuterol  (VENTOLIN  HFA) 108 (90 Base) MCG/ACT inhaler  Has the patient contacted their pharmacy? Yes Not a viable rx at their pharmacy, not on patient list.   Is this the correct pharmacy for this prescription? Yes  This is the patient's preferred pharmacy:  [id number] - [place], [place] - [address] AT [facility] [address] [place] [address] Phone: [phone] Fax: [phone]  Has the prescription been filled recently? No  Is the patient out of the medication? Yes  Has the patient been seen for an appointment in the last year OR does the patient have an upcoming appointment? Yes  Can we respond through MyChart? Yes  Agent: Please be advised that Rx refills may take up to 3 business days. We ask that you follow-up with your pharmacy.

## 2023-12-11 ENCOUNTER — Telehealth: Payer: Self-pay | Admitting: Internal Medicine

## 2023-12-11 NOTE — Telephone Encounter (Signed)
 Cmn received from [facility] for O2 concentrator.

## 2023-12-14 NOTE — Telephone Encounter (Signed)
 CMN faxed successfully and signed.

## 2024-01-21 ENCOUNTER — Ambulatory Visit
Admission: RE | Admit: 2024-01-21 | Discharge: 2024-01-21 | Disposition: A | Discharge status: Home / Self Care | Source: Ambulatory Visit | Attending: Acute Care | Admitting: Acute Care

## 2024-01-21 DIAGNOSIS — Z122 Encounter for screening for malignant neoplasm of respiratory organs: Secondary | ICD-10-CM | POA: Insufficient documentation

## 2024-01-21 DIAGNOSIS — Z87891 Personal history of nicotine dependence: Secondary | ICD-10-CM | POA: Insufficient documentation

## 2024-01-29 ENCOUNTER — Other Ambulatory Visit: Payer: Self-pay

## 2024-01-29 DIAGNOSIS — Z122 Encounter for screening for malignant neoplasm of respiratory organs: Secondary | ICD-10-CM

## 2024-01-29 DIAGNOSIS — Z87891 Personal history of nicotine dependence: Secondary | ICD-10-CM

## 2024-02-20 ENCOUNTER — Other Ambulatory Visit: Payer: Self-pay | Admitting: Gastroenterology

## 2024-02-20 DIAGNOSIS — K760 Fatty (change of) liver, not elsewhere classified: Secondary | ICD-10-CM

## 2024-02-25 ENCOUNTER — Ambulatory Visit
Admission: RE | Admit: 2024-02-25 | Discharge: 2024-02-25 | Disposition: A | Discharge status: Home / Self Care | Source: Ambulatory Visit | Attending: Gastroenterology | Admitting: Gastroenterology

## 2024-02-25 DIAGNOSIS — K760 Fatty (change of) liver, not elsewhere classified: Secondary | ICD-10-CM | POA: Insufficient documentation

## 2024-03-11 ENCOUNTER — Ambulatory Visit: Payer: Medicare Other | Admitting: Dermatology

## 2024-03-11 ENCOUNTER — Encounter: Payer: Self-pay | Admitting: Dermatology

## 2024-03-11 DIAGNOSIS — W908XXA Exposure to other nonionizing radiation, initial encounter: Secondary | ICD-10-CM

## 2024-03-11 DIAGNOSIS — L304 Erythema intertrigo: Secondary | ICD-10-CM

## 2024-03-11 DIAGNOSIS — Z1283 Encounter for screening for malignant neoplasm of skin: Secondary | ICD-10-CM

## 2024-03-11 DIAGNOSIS — D2239 Melanocytic nevi of other parts of face: Secondary | ICD-10-CM

## 2024-03-11 DIAGNOSIS — B353 Tinea pedis: Secondary | ICD-10-CM

## 2024-03-11 DIAGNOSIS — L918 Other hypertrophic disorders of the skin: Secondary | ICD-10-CM

## 2024-03-11 DIAGNOSIS — D2372 Other benign neoplasm of skin of left lower limb, including hip: Secondary | ICD-10-CM

## 2024-03-11 DIAGNOSIS — L578 Other skin changes due to chronic exposure to nonionizing radiation: Secondary | ICD-10-CM | POA: Diagnosis not present

## 2024-03-11 DIAGNOSIS — D239 Other benign neoplasm of skin, unspecified: Secondary | ICD-10-CM

## 2024-03-11 DIAGNOSIS — L814 Other melanin hyperpigmentation: Secondary | ICD-10-CM

## 2024-03-11 DIAGNOSIS — D229 Melanocytic nevi, unspecified: Secondary | ICD-10-CM

## 2024-03-11 DIAGNOSIS — L853 Xerosis cutis: Secondary | ICD-10-CM

## 2024-03-11 DIAGNOSIS — L821 Other seborrheic keratosis: Secondary | ICD-10-CM

## 2024-03-11 DIAGNOSIS — B351 Tinea unguium: Secondary | ICD-10-CM

## 2024-03-11 DIAGNOSIS — D1801 Hemangioma of skin and subcutaneous tissue: Secondary | ICD-10-CM

## 2024-03-11 MED ORDER — KETOCONAZOLE 2 % EX CREA: TOPICAL_CREAM | CUTANEOUS | 5 refills | Status: AC

## 2024-03-11 NOTE — Patient Instructions (Addendum)
 Apply Ketoconazole  2% cream twice daily to feet for fungal infection for a month then 2-3 times a week at bedtime around toenails.   Apply twice daily to skin folds as needed for flares of intertrigo.    Recommend daily broad spectrum sunscreen SPF 30+ to sun-exposed areas, reapply every 2 hours as needed. Call for new or changing lesions.  Staying in the shade or wearing long sleeves, sun glasses (UVA+UVB protection) and wide brim hats (4-inch brim around the entire circumference of the hat) are also recommended for sun protection.      Melanoma ABCDEs  Melanoma is the most dangerous type of skin cancer, and is the leading cause of death from skin disease.  You are more likely to develop melanoma if you: Have light-colored skin, light-colored eyes, or red or blond hair Spend a lot of time in the sun Tan regularly, either outdoors or in a tanning bed Have had blistering sunburns, especially during childhood Have a close family member who has had a melanoma Have atypical moles or large birthmarks  Early detection of melanoma is key since treatment is typically straightforward and cure rates are extremely high if we catch it early.   The first sign of melanoma is often a change in a mole or a new dark spot.  The ABCDE system is a way of remembering the signs of melanoma.  A for asymmetry:  The two halves do not match. B for border:  The edges of the growth are irregular. C for color:  A mixture of colors are present instead of an even brown color. D for diameter:  Melanomas are usually (but not always) greater than 6mm - the size of a pencil eraser. E for evolution:  The spot keeps changing in size, shape, and color.  Please check your skin once per month between visits. You can use a small mirror in front and a large mirror behind you to keep an eye on the back side or your body.   If you see any new or changing lesions before your next follow-up, please call to schedule a  visit.  Please continue daily skin protection including broad spectrum sunscreen SPF 30+ to sun-exposed areas, reapplying every 2 hours as needed when you're outdoors.   Staying in the shade or wearing long sleeves, sun glasses (UVA+UVB protection) and wide brim hats (4-inch brim around the entire circumference of the hat) are also recommended for sun protection.      Due to recent changes in healthcare laws, you may see results of your pathology and/or laboratory studies on MyChart before the doctors have had a chance to review them. We understand that in some cases there may be results that are confusing or concerning to you. Please understand that not all results are received at the same time and often the doctors may need to interpret multiple results in order to provide you with the best plan of care or course of treatment. Therefore, we ask that you please give us  2 business days to thoroughly review all your results before contacting the office for clarification. Should we see a critical lab result, you will be contacted sooner.   If You Need Anything After Your Visit  If you have any questions or concerns for your doctor, please call our main line at [phone] and press option 4 to reach your doctor's medical assistant. If no one answers, please leave a voicemail as directed and we will return your call as soon as possible.  Messages left after 4 pm will be answered the following business day.   You may also send us  a message via MyChart. We typically respond to MyChart messages within 1-2 business days.  For prescription refills, please ask your pharmacy to contact our office. Our fax number is [phone].  If you have an urgent issue when the clinic is closed that cannot wait until the next business day, you can page your doctor at the number below.    Please note that while we do our best to be available for urgent issues outside of office hours, we are not available 24/7.   If  you have an urgent issue and are unable to reach us , you may choose to seek medical care at your doctor's office, retail clinic, urgent care center, or emergency room.  If you have a medical emergency, please immediately call 911 or go to the emergency department.  Pager Numbers  - Dr. Hester: [phone]  - Dr. Jackquline: [phone]  - Dr. Claudene: [phone]   - Dr. Raymund: [phone]  In the event of inclement weather, please call our main line at [phone] for an update on the status of any delays or closures.  Dermatology Medication Tips: Please keep the boxes that topical medications come in in order to help keep track of the instructions about where and how to use these. Pharmacies typically print the medication instructions only on the boxes and not directly on the medication tubes.   If your medication is too expensive, please contact our office at [phone] option 4 or send us  a message through MyChart.   We are unable to tell what your co-pay for medications will be in advance as this is different depending on your insurance coverage. However, we may be able to find a substitute medication at lower cost or fill out paperwork to get insurance to cover a needed medication.   If a prior authorization is required to get your medication covered by your insurance company, please allow us  1-2 business days to complete this process.  Drug prices often vary depending on where the prescription is filled and some pharmacies may offer cheaper prices.  The website www.goodrx.com contains coupons for medications through different pharmacies. The prices here do not account for what the cost may be with help from insurance (it may be cheaper with your insurance), but the website can give you the price if you did not use any insurance.  - You can print the associated coupon and take it with your prescription to the pharmacy.  - You may also stop by our office during regular business  hours and pick up a GoodRx coupon card.  - If you need your prescription sent electronically to a different pharmacy, notify our office through [facility] or by phone at [phone] option 4.     Si Usted Necesita Algo Despus de Su Visita  Tambin puede enviarnos un mensaje a travs de Clinical cytogeneticist. Por lo general respondemos a los mensajes de MyChart en el transcurso de 1 a 2 das hbiles.  Para renovar recetas, por favor pida a su farmacia que se ponga en contacto con nuestra oficina. Randi lakes de fax es [place] [phone].  Si tiene un asunto urgente cuando la clnica est cerrada y que no puede esperar hasta el siguiente da hbil, puede llamar/localizar a su doctor(a) al nmero que aparece a continuacin.   Por favor, tenga en cuenta que aunque hacemos todo lo posible para estar disponibles para asuntos urgentes  fuera del horario de oficina, no estamos disponibles las 24 horas del da, los 7 [address]  [address] de la [place].   Si tiene un problema urgente y no puede comunicarse con nosotros, puede optar por buscar atencin mdica  en el consultorio de su doctor(a), en una clnica privada, en un centro de atencin urgente o en una sala de emergencias.  Si tiene Engineer, drilling, por favor llame inmediatamente al 911 o vaya a la sala de emergencias.  Nmeros de bper  - Dr. Hester: [phone]  - Dra. Jackquline: [phone]  - Dr. Claudene: [phone]  - Dra. Kitts: [phone]  En caso de inclemencias del [place], por favor llame a nuestra lnea principal al [phone] para una actualizacin sobre el estado de cualquier retraso o cierre.  Consejos para la medicacin en dermatologa: Por favor, guarde las cajas en las que vienen los medicamentos de uso tpico para ayudarle a seguir las instrucciones sobre dnde y cmo usarlos. Las farmacias generalmente imprimen las instrucciones del medicamento slo en las cajas y no directamente en los tubos del [place].   Si su  medicamento es muy caro, por favor, pngase en contacto con landry rieger llamando al [phone] y presione la opcin 4 o envenos un mensaje a travs de Clinical cytogeneticist.   No podemos decirle cul ser su copago por los medicamentos por adelantado ya que esto es diferente dependiendo de la cobertura de su seguro. Sin embargo, es posible que podamos encontrar un medicamento sustituto a Audiological scientist un formulario para que el seguro cubra el medicamento que se considera necesario.   Si se requiere una autorizacin previa para que su compaa de seguros malta su medicamento, por favor permtanos de 1 a 2 das hbiles para completar este proceso.  Los precios de los medicamentos varan con frecuencia dependiendo del Environmental consultant de dnde se surte la receta y alguna farmacias pueden ofrecer precios ms baratos.  El sitio web www.goodrx.com tiene cupones para medicamentos de Health and safety inspector. Los precios aqu no tienen en cuenta lo que podra costar con la ayuda del seguro (puede ser ms barato con su seguro), pero el sitio web puede darle el precio si no utiliz Tourist information centre manager.  - Puede imprimir el cupn correspondiente y llevarlo con su receta a la farmacia.  - Tambin puede pasar por nuestra oficina durante el horario de atencin regular y Education officer, museum una tarjeta de cupones de GoodRx.  - Si necesita que su receta se enve electrnicamente a una farmacia diferente, informe a nuestra oficina a travs de MyChart de [place] o por telfono llamando al [phone] y presione la opcin 4.

## 2024-03-11 NOTE — Progress Notes (Signed)
 Follow-Up Visit   Subjective  Emily Melton is a 63 y.o. female who presents for the following: Skin Cancer Screening and Full Body Skin Exam. No personal Hx of skin cancer. Father has had skin cancers.  Hx of several bad sunburns.   The patient presents for Total-Body Skin Exam (TBSE) for skin cancer screening and mole check. The patient has spots, moles and lesions to be evaluated, some may be new or changing and the patient may have concern these could be cancer.    The following portions of the chart were reviewed this encounter and updated as appropriate: medications, allergies, medical history  Review of Systems:  No other skin or systemic complaints except as noted in HPI or Assessment and Plan.  Objective  Well appearing patient in no apparent distress; mood and affect are within normal limits.  A full examination was performed including scalp, head, eyes, ears, nose, lips, neck, chest, axillae, abdomen, back, buttocks, bilateral upper extremities, bilateral lower extremities, hands, feet, fingers, toes, fingernails, and toenails. All findings within normal limits unless otherwise noted below.   Relevant physical exam findings are noted in the Assessment and Plan.    Assessment & Plan   SKIN CANCER SCREENING PERFORMED TODAY.  ACTINIC DAMAGE - Chronic condition, secondary to cumulative UV/sun exposure - diffuse scaly erythematous macules with underlying dyspigmentation - Recommend daily broad spectrum sunscreen SPF 30+ to sun-exposed areas, reapply every 2 hours as needed.  - Staying in the shade or wearing long sleeves, sun glasses (UVA+UVB protection) and wide brim hats (4-inch brim around the entire circumference of the hat) are also recommended for sun protection.  - Call for new or changing lesions.  LENTIGINES, SEBORRHEIC KERATOSES, HEMANGIOMAS - Benign normal skin lesions - SK  - L upper eyelid margin, waxy papule - Benign-appearing - Call for any  changes  MELANOCYTIC NEVI - Tan-brown and/or pink-flesh-colored symmetric macules and papules - L infraocular (vs SK)  L malar cheek-  flesh papules - Benign appearing on exam today - Observation - Call clinic for new or changing moles - Recommend daily use of broad spectrum spf 30+ sunscreen to sun-exposed areas.    DERMATOFIBROMA - L ant thigh Exam: Firm pink/brown papulenodule with dimple sign.  Treatment Plan: A dermatofibroma is a benign growth possibly related to trauma, such as an insect bite, cut from shaving, or inflamed acne-type bump.  Treatment options to remove include shave or excision with resulting scar and risk of recurrence.  Since benign-appearing and not bothersome, will observe for no   TINEA PEDIS Exam: hyperkeratosis and scaling at B/L feet over distal and lateral soles.and maceration web spaces. Thickened discolored toenails.  Treatment Plan: Apply Ketoconazole  2% cream twice daily to feet for a month then 2-3 times a week at bedtime around toenails.    Acrochordons (Skin Tags) - Fleshy, skin-colored pedunculated papules - Benign appearing.  - Observe. - If desired, they can be removed with an in office procedure that is not covered by insurance. - Please call the clinic if you notice any new or changing lesions.   Xerosis - diffuse xerotic patches at arms and legs - recommend gentle, hydrating skin care - gentle skin care handout given  ONYCHOMYCOSIS Exam: Nail thickening with discoloration of 70% of nail on the L great toenail, med nail plate involvement on the R great toenail  Chronic and persistent condition with duration or expected duration over one year. Condition is symptomatic/ bothersome to patient. Not currently at goal.  Treatment Plan: Chronic fungal infection of nail Pt not interested in oral antifungal due to h/o fatty liver Apply Ketoconazole  cream as directed.   INTERTRIGO Exam: Clear today   Chronic condition with duration  or expected duration over one year. Currently well-controlled.   Intertrigo is a chronic recurrent rash that occurs in skin fold areas that may be associated with friction; heat; moisture; yeast; fungus; and bacteria.  It is exacerbated by increased movement / activity; sweating; and higher atmospheric temperature.   Treatment Plan Continue Ketoconazole  2% cream BID PRN flares. Once improved decrease to QD during warmer weather as preventative.  Recommend OTC Zeasorb AF powder to body folds daily after shower.  It is often found in the athlete's foot section in the pharmacy.  Avoid using powders that contain cornstarch.     Return in about 1 year (around 03/11/2025) for TBSE.  I, Kate Fought, CMA, am acting as scribe for Rexene Rattler, MD.   Documentation: I have reviewed the above documentation for accuracy and completeness, and I agree with the above.  Rexene Rattler, MD

## 2024-03-31 ENCOUNTER — Other Ambulatory Visit: Payer: Self-pay | Admitting: Family Medicine

## 2024-03-31 DIAGNOSIS — Z1231 Encounter for screening mammogram for malignant neoplasm of breast: Secondary | ICD-10-CM

## 2024-04-07 NOTE — Progress Notes (Unsigned)
 HPI  female former smoker followed for nocturnal hypoxia/minimal OSA, history Narcolepsy Sleep Study 2016 - Dr Buck. History of Narcolepsy - diagnosed 2001- tried and failed Ritalin and Provigil. NPSG  11/30/14- [facility] AHI 5.3/ hr, desat to 83%, weight 216 lbs Study in 2001 in [place], [place] , with report no longer available, included MSLT and she says diagnosed narcolepsy without sleep apnea. The 2016 sleep study was supposed to have included an MSLT but that didn't get done and she didn't like her experience there. Takes Zanaflex  for cervical radiculopathy pain and it helps her sleep .Previous trials of Provigil and Ritalin both said to cause HBP and swelling. She ended up on Dexedrine  for a while. Tried Mirapex  for vaguely defined limb jerks. This caused a lot of malaise and nausea. NPSG 10/19/2015-AHI 2.4 per hour/WNL, desaturation to 81%, moderate snoring, severe PLMS with arousal 6.3/hour MSLT- 10/20/2015-mean latency 3 minutes 43 seconds, 0/5 SOREM    C/W idiopathic hypersomnia History of recurrent bronchitis and chronic cough until she quit smoking. Several previous pneumonias .PFT 12/08/2015-moderate obstructive airways disease, insignificant response to bronchodilator, normal total lung capacity. They could not get an accurate diffusion capacity ONOX on room air 08/23/22- 7 hrd 23 min </= 88% --------------------------------------------------------------------------------------------------   04/09/23- 63 year old female former smoker( 36 pk yr) followed for COPD, Nocturnal Hypoxia/ minimal OSA, Narcolepsy, Insomnia, complicated by GERD/Barrett's, Hx parathyroidectomy/partial thyroidectomy/ Obesity, HTN, CAD,  (Remotely ritalin helped, but may have had allergic reaction.Didn't like Provigil. No cataplexy.) Now questions if adderall gives headache.Tried Vyvanse  10 mg qAM and now uses dexedrine  occasionally. -Ventolin  hfa, Singulair , Dexedrine  10 mg      note Inderal  Does  not have CPAP  -----Breathing has been good  O2 helps sleep at night.Her dog gets her up. Eating better now that she got out of toxic relationship and living alone. Flu vax today. CT chest 01/19/23- MPRESSION: 1. Lung-RADS 2S, benign appearance or behavior. Continue annual screening with low-dose chest CT without contrast in 12 months. 2. The S modifier above refers to potentially clinically significant non lung cancer related findings. Specifically, there is aortic atherosclerosis, in addition to right coronary artery disease. Please note that although the presence of coronary artery calcium  documents the presence of coronary artery disease, the severity of this disease and any potential stenosis cannot be assessed on this non-gated CT examination. Assessment for potential risk factor modification, dietary therapy or pharmacologic therapy may be warranted, if clinically indicated. 3. Mild diffuse bronchial wall thickening with very mild centrilobular and paraseptal emphysema; imaging findings suggestive of underlying COPD. Aortic Atherosclerosis (ICD10-I70.0) and Emphysema (ICD10-J43.9).  04/08/24- 63 year old female former smoker( 36 pk yr) followed for COPD, Nocturnal Hypoxia/ minimal OSA, Narcolepsy, Insomnia, complicated by GERD/Barrett's, Hx parathyroidectomy/partial thyroidectomy/ Obesity, HTN, CAD,  (Remotely ritalin helped, but may have had allergic reaction.Didn't like Provigil. No cataplexy.) Now questions if adderall gives headache.Tried Vyvanse  10 mg qAM and now uses dexedrine  occasionally. -Ventolin  hfa, Singulair , Dexedrine  10 mg      note Inderal  O2 2L for sleep. Does not have CPAP. Naps when needed. Sleep feels stable.Only occasionally needs dexedrine . Breathing has been sta ble with no new exacerbation. Nasal prongs irritate nose. I suggested she try a BiFlow nasal cup mask, which we will order through her DME History of Present Illness  CT LD screen-  01/21/24 IMPRESSION: Lung-RADS 2, benign appearance or behavior. Continue annual screening with low-dose chest CT without contrast in 12 months. Stable mild cardiomegaly.  Mild coronary artery calcification. Aortic Atherosclerosis (ICD10-I70.0).  ROS-see HPI  + = positive Constitutional:    +weight loss, night sweats, fevers, chills, + fatigue, lassitude. HEENT:    headaches, difficulty swallowing, tooth/dental problems, sore throat,       sneezing, itching, ear ache, nasal congestion, post nasal drip, snoring CV:    chest pain, orthopnea, PND, swelling in lower extremities, anasarca,                                                     dizziness, palpitations Resp:   shortness of breath with exertion or at rest.                +roductive cough,   non-productive cough, coughing up of blood.              change in color of mucus.  wheezing.   Skin:    rash or lesions. GI:  No-   heartburn, indigestion, abdominal pain, nausea, vomiting, diarrhea,                 change in bowel habits, loss of appetite GU: dysuria, change in color of urine, no urgency or frequency.   flank pain. MS:   joint pain, stiffness, decreased range of motion, + back pain. Neuro-     nothing unusual Psych:  change in mood or affect.  depression or anxiety.   memory loss.  OBJ- Physical Exam General- Alert, Oriented, Affect-appropriate, Distress- none acute, + obese Skin- rash-none, lesions- none, excoriation- none Lymphadenopathy- none Head- atraumatic            Eyes- Gross vision intact, PERRLA, conjunctivae and secretions clear            Ears- Hearing, canals-normal            Nose- Clear, no-Septal dev, mucus, polyps, erosion, perforation             Throat- Mallampati III , mucosa clear , drainage- none, tonsils- atrophic Neck- flexible , trachea midline, no stridor , thyroid nl, carotid no bruit Chest - symmetrical excursion , unlabored           Heart/CV- RRR/ slow , no murmur , no gallop  , no rub, nl s1  s2                           - JVD- none , edema- none, stasis changes- none, varices- none           Lung- clear to P&A, wheeze- none, cough- none , dullness-none, rub- none           Chest wall-  Abd-  Br/ Gen/ Rectal- Not done, not indicated Extrem- cyanosis- none, clubbing, none, atrophy- none, strength- nl Neuro- grossly intact to observation

## 2024-04-08 ENCOUNTER — Ambulatory Visit: Payer: Medicare Other | Admitting: Internal Medicine

## 2024-04-08 ENCOUNTER — Encounter: Payer: Self-pay | Admitting: Internal Medicine

## 2024-04-08 VITALS — BP 132/73 | HR 56 | Temp 97.6°F | Ht 62.0 in | Wt 228.6 lb

## 2024-04-08 DIAGNOSIS — G47419 Narcolepsy without cataplexy: Secondary | ICD-10-CM

## 2024-04-08 DIAGNOSIS — J449 Chronic obstructive pulmonary disease, unspecified: Secondary | ICD-10-CM | POA: Diagnosis not present

## 2024-04-08 DIAGNOSIS — G4734 Idiopathic sleep related nonobstructive alveolar hypoventilation: Secondary | ICD-10-CM

## 2024-04-08 MED ORDER — DEXTROAMPHETAMINE SULFATE ER 10 MG PO CP24: ORAL_CAPSULE | ORAL | 0 refills | Status: AC

## 2024-04-08 MED ORDER — ALBUTEROL SULFATE HFA 108 (90 BASE) MCG/ACT IN AERS: 1.0000 | INHALATION_SPRAY | RESPIRATORY_TRACT | 12 refills | Status: AC | PRN

## 2024-04-08 NOTE — Patient Instructions (Signed)
 Order- DME [facility]- please provide BiFlow Nasal Mask for use with home O2 instead of cannula.  Scripts sent refilling albuterol  rescue inhaler and dexedrine 

## 2024-04-09 NOTE — Assessment & Plan Note (Signed)
 Indicates she feels very stable with naps and occasional dexedrine . Describes several in family with Autism spectrum- presume unrelated iasue.

## 2024-04-09 NOTE — Assessment & Plan Note (Signed)
 Stable, well controlled Plan- refill albuterol  rescue inhaler

## 2024-04-09 NOTE — Assessment & Plan Note (Addendum)
 Very consistently sleeping with O2. Plan- Asking DME for BiFlow nasal cup mask to see if causes less contact irritation at her nose.

## 2024-04-29 ENCOUNTER — Ambulatory Visit
Admission: RE | Admit: 2024-04-29 | Discharge: 2024-04-29 | Disposition: A | Discharge status: Home / Self Care | Source: Ambulatory Visit | Attending: Family Medicine | Admitting: Family Medicine

## 2024-04-29 DIAGNOSIS — Z1231 Encounter for screening mammogram for malignant neoplasm of breast: Secondary | ICD-10-CM | POA: Diagnosis present

## 2024-05-01 ENCOUNTER — Telehealth: Payer: Self-pay | Admitting: Family Medicine

## 2024-05-01 NOTE — Telephone Encounter (Signed)
 Copied from CRM [id number]. Topic: Clinical - Order For Equipment >> Apr 30, 2024  1:51 PM Devaughn RAMAN wrote: Reason for CRM: Patient calling in regarding order for [facility] for the biflow nasal mask for 02. Patient stated she contacted [facility] and [facility] and was advised to f/u with Dr.Young regarding the order.

## 2024-05-01 NOTE — Telephone Encounter (Signed)
 Pt following up in order for Bipap mask.  [facility] told her they never  received anything from the dr, please advise

## 2024-05-02 ENCOUNTER — Telehealth: Payer: Self-pay

## 2024-05-02 NOTE — Telephone Encounter (Signed)
 Spoke with patient and gave her these options. Pt verbalized understanding. NFN

## 2024-05-02 NOTE — Telephone Encounter (Signed)
 [facility] does not carry this mask for O2. Please advise

## 2024-05-02 NOTE — Telephone Encounter (Signed)
 Copied from CRM [id number]. Topic: Clinical - Order For Equipment >> Apr 30, 2024  1:51 PM Devaughn RAMAN wrote: Reason for CRM: Patient calling in regarding order for [facility] for the biflow nasal mask for 02. Patient stated she contacted [facility] and [facility] and was advised to f/u with Dr.Young regarding the order.

## 2024-05-02 NOTE — Telephone Encounter (Signed)
 Might be able to find the Biflow Nasal oxygen mask on-line at [employer], or possibly a medical supply store like [facility].

## 2024-05-02 NOTE — Telephone Encounter (Signed)
 Spoke with pt she does not need to see Dr. Neysa she was advised to check other places to get her mask and pt has verbalized understanding that she can find the mask at other places. NFN

## 2024-05-02 NOTE — Telephone Encounter (Signed)
 Routing to [facility] to f/u on dme referral for mask

## 2024-05-02 NOTE — Telephone Encounter (Signed)
 Copied from CRM [id number]. Topic: Clinical - Order For Equipment >> Apr 30, 2024  1:51 PM Emily Melton wrote: Reason for CRM: Patient calling in regarding order for [facility] for the biflow nasal mask for 02. Patient stated she contacted [facility] and [facility] and was advised to f/u with Dr.Young regarding the order.    Reached out to [facility] to advise

## 2025-03-17 ENCOUNTER — Encounter: Admitting: Dermatology
